# Patient Record
Sex: Female | Born: 1960 | Race: Black or African American | Hispanic: No | Marital: Single | Smoking: Never smoker
Health system: Southern US, Community
[De-identification: ages and names within clinical notes are randomized; demographics above are authoritative.]

## PROBLEM LIST (undated history)

## (undated) DIAGNOSIS — Z9221 Personal history of antineoplastic chemotherapy: Secondary | ICD-10-CM

## (undated) DIAGNOSIS — C50919 Malignant neoplasm of unspecified site of unspecified female breast: Secondary | ICD-10-CM

## (undated) DIAGNOSIS — M1990 Unspecified osteoarthritis, unspecified site: Secondary | ICD-10-CM

## (undated) DIAGNOSIS — C801 Malignant (primary) neoplasm, unspecified: Secondary | ICD-10-CM

## (undated) DIAGNOSIS — Z808 Family history of malignant neoplasm of other organs or systems: Secondary | ICD-10-CM

## (undated) DIAGNOSIS — Z923 Personal history of irradiation: Secondary | ICD-10-CM

## (undated) DIAGNOSIS — R112 Nausea with vomiting, unspecified: Secondary | ICD-10-CM

## (undated) DIAGNOSIS — Z803 Family history of malignant neoplasm of breast: Secondary | ICD-10-CM

## (undated) DIAGNOSIS — G43909 Migraine, unspecified, not intractable, without status migrainosus: Secondary | ICD-10-CM

## (undated) DIAGNOSIS — Z8669 Personal history of other diseases of the nervous system and sense organs: Secondary | ICD-10-CM

## (undated) DIAGNOSIS — Z800 Family history of malignant neoplasm of digestive organs: Secondary | ICD-10-CM

## (undated) DIAGNOSIS — Z9889 Other specified postprocedural states: Secondary | ICD-10-CM

## (undated) DIAGNOSIS — Z853 Personal history of malignant neoplasm of breast: Secondary | ICD-10-CM

## (undated) DIAGNOSIS — F419 Anxiety disorder, unspecified: Secondary | ICD-10-CM

## (undated) DIAGNOSIS — Z972 Presence of dental prosthetic device (complete) (partial): Secondary | ICD-10-CM

## (undated) HISTORY — DX: Family history of malignant neoplasm of breast: Z80.3

## (undated) HISTORY — DX: Migraine, unspecified, not intractable, without status migrainosus: G43.909

## (undated) HISTORY — DX: Unspecified osteoarthritis, unspecified site: M19.90

## (undated) HISTORY — DX: Family history of malignant neoplasm of other organs or systems: Z80.8

## (undated) HISTORY — DX: Family history of malignant neoplasm of digestive organs: Z80.0

---

## 2003-04-26 ENCOUNTER — Emergency Department (HOSPITAL_COMMUNITY): Admission: EM | Admit: 2003-04-26 | Discharge: 2003-04-27 | Payer: Self-pay | Admitting: Emergency Medicine

## 2003-06-23 ENCOUNTER — Encounter: Admission: RE | Admit: 2003-06-23 | Discharge: 2003-09-21 | Payer: Self-pay | Admitting: Orthopedic Surgery

## 2003-12-05 ENCOUNTER — Emergency Department (HOSPITAL_COMMUNITY): Admission: AD | Admit: 2003-12-05 | Discharge: 2003-12-05 | Payer: Self-pay | Admitting: Family Medicine

## 2004-06-01 ENCOUNTER — Encounter: Admission: RE | Admit: 2004-06-01 | Discharge: 2004-06-01 | Payer: Self-pay | Admitting: Internal Medicine

## 2004-06-01 ENCOUNTER — Encounter (INDEPENDENT_AMBULATORY_CARE_PROVIDER_SITE_OTHER): Payer: Self-pay | Admitting: *Deleted

## 2005-02-02 ENCOUNTER — Encounter: Admission: RE | Admit: 2005-02-02 | Discharge: 2005-02-02 | Payer: Self-pay | Admitting: Orthopedic Surgery

## 2006-10-31 ENCOUNTER — Emergency Department (HOSPITAL_COMMUNITY): Admission: EM | Admit: 2006-10-31 | Discharge: 2006-10-31 | Payer: Self-pay | Admitting: Emergency Medicine

## 2007-02-11 ENCOUNTER — Encounter: Admission: RE | Admit: 2007-02-11 | Discharge: 2007-02-11 | Payer: Self-pay | Admitting: Internal Medicine

## 2007-04-10 ENCOUNTER — Ambulatory Visit (HOSPITAL_COMMUNITY): Admission: RE | Admit: 2007-04-10 | Discharge: 2007-04-10 | Payer: Self-pay | Admitting: Gastroenterology

## 2009-11-16 ENCOUNTER — Encounter: Admission: RE | Admit: 2009-11-16 | Discharge: 2009-11-16 | Payer: Self-pay | Admitting: Internal Medicine

## 2010-04-19 ENCOUNTER — Encounter: Admission: RE | Admit: 2010-04-19 | Discharge: 2010-04-19 | Payer: Self-pay | Admitting: Internal Medicine

## 2010-05-24 ENCOUNTER — Encounter: Admission: RE | Admit: 2010-05-24 | Discharge: 2010-05-24 | Payer: Self-pay | Admitting: Obstetrics and Gynecology

## 2010-06-28 ENCOUNTER — Inpatient Hospital Stay (HOSPITAL_COMMUNITY): Admission: RE | Admit: 2010-06-28 | Discharge: 2010-06-30 | Payer: Self-pay | Admitting: Obstetrics and Gynecology

## 2010-06-28 ENCOUNTER — Encounter (INDEPENDENT_AMBULATORY_CARE_PROVIDER_SITE_OTHER): Payer: Self-pay | Admitting: Obstetrics and Gynecology

## 2010-06-28 HISTORY — PX: TOTAL ABDOMINAL HYSTERECTOMY W/ BILATERAL SALPINGOOPHORECTOMY: SHX83

## 2010-06-28 HISTORY — PX: CYSTOSCOPY: SHX5120

## 2010-12-31 ENCOUNTER — Encounter: Payer: Self-pay | Admitting: Internal Medicine

## 2011-02-23 LAB — CBC
MCV: 80.2 fL (ref 78.0–100.0)
Platelets: 258 10*3/uL (ref 150–400)
RBC: 3.91 MIL/uL (ref 3.87–5.11)
WBC: 9.4 10*3/uL (ref 4.0–10.5)

## 2011-02-23 LAB — BASIC METABOLIC PANEL
Calcium: 8.5 mg/dL (ref 8.4–10.5)
Chloride: 103 mEq/L (ref 96–112)
Creatinine, Ser: 0.61 mg/dL (ref 0.4–1.2)
GFR calc Af Amer: >60 mL/min (ref >60)
GFR calc non Af Amer: >60 mL/min (ref >60)

## 2011-02-24 LAB — SURGICAL PCR SCREEN
MRSA, PCR: NEGATIVE
Staphylococcus aureus: NEGATIVE

## 2011-02-24 LAB — PREGNANCY, URINE: Preg Test, Ur: NEGATIVE

## 2011-02-24 LAB — CBC
Platelets: 332 10*3/uL (ref 150–400)
RBC: 4.51 MIL/uL (ref 3.87–5.11)
WBC: 4.4 10*3/uL (ref 4.0–10.5)

## 2011-02-24 LAB — BASIC METABOLIC PANEL
Calcium: 9.1 mg/dL (ref 8.4–10.5)
GFR calc Af Amer: >60 mL/min (ref >60)
GFR calc non Af Amer: >60 mL/min (ref >60)
Potassium: 3.5 mEq/L (ref 3.5–5.1)
Sodium: 135 mEq/L (ref 135–145)

## 2011-04-26 NOTE — Op Note (Signed)
NAME:  Vanessa [name], Vanessa [name]              ACCOUNT NO.:  [id number]   MEDICAL RECORD NO.:  [id number]          PATIENT TYPE:  AMB   LOCATION:  ENDO                         FACILITY:  [facility]   PHYSICIAN:  Anselmo Rod, M.D.  DATE OF BIRTH:  07-16-1961   DATE OF PROCEDURE:  04/10/2007  DATE OF DISCHARGE:                               OPERATIVE REPORT   PROCEDURE PERFORMED:  Screening colonoscopy.   ENDOSCOPIST:  Anselmo Rod, M.D.   INSTRUMENT USED:  Pentax video colonoscope.   INDICATIONS FOR PROCEDURE:  A 50 year old [place] American female with a  family history of colon cancer in a sister diagnosed at the age of 15  and her paternal uncle diagnosed in his 3s, undergoing screening  colonoscopy to rule out colonic polyps, masses, etc.   PREPROCEDURE PREPARATION:  Informed consent was procured from the  patient.  The patient fasted for 8 hours prior to the procedure and  prepped with a bottle of magnesium citrate and a gallon of NuLYTELY the  night prior to the procedure.  Risks and benefits of the procedure  including a 10% miss rate of cancer and polyp were discussed with the  patient as well.   PREPROCEDURE PHYSICAL:  VITAL SIGNS:  The patient had stable vital  signs.  NECK:  Supple.  CHEST:  Clear to auscultation.  HEART:  S1 and S2 regular.  ABDOMEN:  Soft with normal bowel sounds.   DESCRIPTION OF PROCEDURE:  The patient was placed in the left lateral  decubitus position and sedated with 100 mcg of Fentanyl and 10 mg of  Versed given intravenously in slow incremental doses.  Once the patient  was adequately sedated and maintained on low-flow oxygen and continuous  cardiac monitoring, the Pentax video colonoscope was advanced from the  rectum to the cecum.  The appendiceal orifice and ileocecal valve were  clearly visualized and photographed.  The terminal ileum was briefly  visualized and appeared healthy without lesions.  No masses, polyps,  erosions, ulcerations or  diverticula were seen.  Retroflexion in the  rectum revealed small internal hemorrhoids.  The patient tolerated the  procedure well without immediate complications.   IMPRESSION:  Normal colonoscopy to the terminal ileum except for small  nonbleeding internal hemorrhoids. No masses, polyps or diverticula seen.   RECOMMENDATIONS:  1. Continue a high fiber diet with liberal fluid intake.  2. Repeat colonoscopy in the next five years unless the patient has      any abnormal symptoms in interim, in which case she should contact      the office immediately for further recommendations.  3. Outpatient follow-up as need arises in the future.  The patient is      to continue high fiber diet for now.      Anselmo Rod, M.D.  Electronically Signed     JNM/MEDQ  D:  04/10/2007  T:  04/10/2007  Job:  [id number]   cc:   Merlene Laughter. Renae Gloss, M.D.

## 2011-05-10 ENCOUNTER — Other Ambulatory Visit: Payer: Self-pay | Admitting: Internal Medicine

## 2011-05-10 DIAGNOSIS — Z1231 Encounter for screening mammogram for malignant neoplasm of breast: Secondary | ICD-10-CM

## 2011-05-16 ENCOUNTER — Ambulatory Visit
Admission: RE | Admit: 2011-05-16 | Discharge: 2011-05-16 | Disposition: A | Discharge status: Home / Self Care | Payer: 59 | Source: Ambulatory Visit | Attending: Internal Medicine | Admitting: Internal Medicine

## 2011-05-16 DIAGNOSIS — Z1231 Encounter for screening mammogram for malignant neoplasm of breast: Secondary | ICD-10-CM

## 2011-07-05 ENCOUNTER — Other Ambulatory Visit: Payer: Self-pay | Admitting: Specialist

## 2011-07-05 ENCOUNTER — Encounter (HOSPITAL_COMMUNITY): Payer: Worker's Compensation

## 2011-07-05 ENCOUNTER — Ambulatory Visit (HOSPITAL_COMMUNITY)
Admission: RE | Admit: 2011-07-05 | Discharge: 2011-07-05 | Disposition: A | Discharge status: Home / Self Care | Payer: Worker's Compensation | Source: Ambulatory Visit | Attending: Specialist | Admitting: Specialist

## 2011-07-05 ENCOUNTER — Other Ambulatory Visit (HOSPITAL_COMMUNITY): Payer: Self-pay | Admitting: Specialist

## 2011-07-05 DIAGNOSIS — Z01818 Encounter for other preprocedural examination: Secondary | ICD-10-CM | POA: Insufficient documentation

## 2011-07-05 DIAGNOSIS — Z01810 Encounter for preprocedural cardiovascular examination: Secondary | ICD-10-CM | POA: Insufficient documentation

## 2011-07-05 DIAGNOSIS — Z01812 Encounter for preprocedural laboratory examination: Secondary | ICD-10-CM | POA: Insufficient documentation

## 2011-07-05 LAB — CBC
HCT: 38.9 % (ref 36.0–46.0)
Hemoglobin: 12.2 g/dL (ref 12.0–15.0)
WBC: 3.5 10*3/uL — ABNORMAL LOW (ref 4.0–10.5)

## 2011-07-05 LAB — BASIC METABOLIC PANEL
BUN: 12 mg/dL (ref 6–23)
Chloride: 106 mEq/L (ref 96–112)
GFR calc Af Amer: >60 mL/min (ref >60)
Glucose, Bld: 86 mg/dL (ref 70–99)
Potassium: 3.3 mEq/L — ABNORMAL LOW (ref 3.5–5.1)
Sodium: 140 mEq/L (ref 135–145)

## 2011-07-05 LAB — SURGICAL PCR SCREEN: Staphylococcus aureus: NEGATIVE

## 2011-07-11 ENCOUNTER — Ambulatory Visit (HOSPITAL_BASED_OUTPATIENT_CLINIC_OR_DEPARTMENT_OTHER): Admission: RE | Admit: 2011-07-11 | Payer: Worker's Compensation | Source: Ambulatory Visit | Admitting: Specialist

## 2011-07-11 ENCOUNTER — Ambulatory Visit (HOSPITAL_COMMUNITY)
Admission: RE | Admit: 2011-07-11 | Discharge: 2011-07-11 | Disposition: A | Discharge status: Home / Self Care | Payer: Worker's Compensation | Source: Ambulatory Visit | Attending: Specialist | Admitting: Specialist

## 2011-07-11 DIAGNOSIS — I10 Essential (primary) hypertension: Secondary | ICD-10-CM | POA: Insufficient documentation

## 2011-07-11 DIAGNOSIS — M23302 Other meniscus derangements, unspecified lateral meniscus, unspecified knee: Secondary | ICD-10-CM | POA: Insufficient documentation

## 2011-07-11 DIAGNOSIS — Z363 Encounter for antenatal screening for malformations: Secondary | ICD-10-CM | POA: Insufficient documentation

## 2011-07-11 DIAGNOSIS — M23329 Other meniscus derangements, posterior horn of medial meniscus, unspecified knee: Secondary | ICD-10-CM | POA: Insufficient documentation

## 2011-07-11 DIAGNOSIS — Z01810 Encounter for preprocedural cardiovascular examination: Secondary | ICD-10-CM | POA: Insufficient documentation

## 2011-07-11 DIAGNOSIS — Z01812 Encounter for preprocedural laboratory examination: Secondary | ICD-10-CM | POA: Insufficient documentation

## 2011-07-11 DIAGNOSIS — Z1389 Encounter for screening for other disorder: Secondary | ICD-10-CM | POA: Insufficient documentation

## 2011-07-11 HISTORY — PX: KNEE ARTHROSCOPY: SHX127

## 2011-07-18 NOTE — Op Note (Signed)
  NAME:  Vanessa [name], NYKIRA REDDIX NO.:  [id number]  MEDICAL RECORD NO.:  [id number]  LOCATION:  DAYL                         FACILITY:  [facility]  PHYSICIAN:  Jene Every, M.D.    DATE OF BIRTH:  Apr 09, 1961  DATE OF PROCEDURE: DATE OF DISCHARGE:  07/11/2011                              OPERATIVE REPORT   PREOPERATIVE DIAGNOSES:  Medial meniscus tear of the right knee, osteoarthritis of patellofemoral joint, morbid obesity.  POSTOPERATIVE DIAGNOSES:  Medial meniscus tear of the right knee, osteoarthritis of patellofemoral joint.  PROCEDURES PERFORMED: 1. Right knee arthroscopy. 2. Partial medial meniscectomy. 3. Debridement of patellofemoral joint with chondroplasty patella and     the sulcus. 4. Debridement of lateral meniscus. 5. Degree of difficulty increased due to the patient's morbid obesity.  HISTORY:  A 50 year old with knee pain, fell at work, persistent knee pain, locking, popping, giving away.  MRI indicating moderate osteoarthritic changes of patellofemoral joint, chondromalacia, general changes of tibial plateau, discoid lateral meniscus, moderate effusion. Treated with refractory conservative treatments, indicated for arthroscopic evaluation and partial meniscectomy.  Risks and benefits discussed including infection, damage to vascular structures, no change in symptoms, worsening symptoms, need for repeat debridement, DVT, PE, anesthetic complications, etc.  TECHNIQUE:  With the patient in supine position after induction of adequate general anesthesia at 400 IV Cipro, the left lower extremity was prepped and draped in usual sterile fashion.  A lateral parapatellar portal was fashioned with a #11 blade.  Ingress cannula atraumatically placed.  Irrigant was utilized to insufflate the joint.  Under direct visualization, a medial parapatellar portal was fashioned with a #11 blade after localization with an 18 gauge needle, sparing the medial meniscus.  The  patient's degree of difficulty was increased due to her morbid obesity.  There was extensive grade 3 changes of the medial compartment.  Light chondroplasty performed on femoral condyle and tibial plateau with a 3.5 shaver.  Degenerative tearing of the posterior third of medial meniscus and a straight basket rongeur was introduced and utilized for partial medial meniscectomy to a stable base further contoured with a 3.5 shaver.  No grade 4 changes were seen.  ACL was unremarkable.  Lateral compartment revealed degenerative tearing of the lateral meniscus.  There was no discoid meniscus noted.  I debrided and shaved this to a stable base.  Light chondroplasty of femoral condyle was performed.  Suprapatellar pouch revealed extensive grade 3 changes of patellofemoral joint.  She actually had reasonable patellofemoral tracking.  No need for release.  Light chondroplasty performed of patella and sulcus. Gutters were unremarkable.  Copiously lavaged and I reexamined all compartments.  No further pathology amenable to arthroscopic intervention, therefore removed all instrumentation.  Portals were closed with 4-0 nylon simple sutures. Marcaine 0.25% with epinephrine was infiltrated in the joint.  Wound was dressed sterilely.  Awoken without difficulty.  Transported to recovery room in satisfactory condition.  The patient tolerated the procedure well.  There were no complications.     Jene Every, M.D.     Cordelia Pen  D:  07/11/2011  T:  07/12/2011  Job:  [id number]  Electronically Signed by Jene Every M.D. on 07/18/2011 07:03:21 AM

## 2011-11-25 ENCOUNTER — Ambulatory Visit (INDEPENDENT_AMBULATORY_CARE_PROVIDER_SITE_OTHER): Payer: 59

## 2011-11-25 DIAGNOSIS — K121 Other forms of stomatitis: Secondary | ICD-10-CM

## 2011-11-25 DIAGNOSIS — K0510 Chronic gingivitis, plaque induced: Secondary | ICD-10-CM

## 2011-11-25 DIAGNOSIS — E782 Mixed hyperlipidemia: Secondary | ICD-10-CM

## 2012-02-16 ENCOUNTER — Ambulatory Visit (INDEPENDENT_AMBULATORY_CARE_PROVIDER_SITE_OTHER): Payer: 59 | Admitting: Family Medicine

## 2012-02-16 VITALS — BP 123/87 | HR 87 | Temp 98.4°F | Resp 16 | Ht 64.0 in | Wt 270.0 lb

## 2012-02-16 DIAGNOSIS — J069 Acute upper respiratory infection, unspecified: Secondary | ICD-10-CM

## 2012-02-16 DIAGNOSIS — J40 Bronchitis, not specified as acute or chronic: Secondary | ICD-10-CM

## 2012-02-16 NOTE — Progress Notes (Signed)
51 yo woman at [employer] with 3-4 days of head congestion and cough, worsening.  Now wheezing.  No h/o asthma (children have asthma).   O:  HEENT: copious m/p discharge Chest:  Wheezes  A:  Bronchitis  P:  Z-pak, prednisone, tessalon

## 2012-04-16 ENCOUNTER — Other Ambulatory Visit: Payer: Self-pay | Admitting: Internal Medicine

## 2012-04-17 ENCOUNTER — Other Ambulatory Visit: Payer: Self-pay | Admitting: Obstetrics and Gynecology

## 2012-04-17 DIAGNOSIS — N63 Unspecified lump in unspecified breast: Secondary | ICD-10-CM

## 2012-04-17 DIAGNOSIS — N644 Mastodynia: Secondary | ICD-10-CM

## 2012-04-21 ENCOUNTER — Ambulatory Visit
Admission: RE | Admit: 2012-04-21 | Discharge: 2012-04-21 | Disposition: A | Discharge status: Home / Self Care | Payer: 59 | Source: Ambulatory Visit | Attending: Obstetrics and Gynecology | Admitting: Obstetrics and Gynecology

## 2012-04-21 DIAGNOSIS — N644 Mastodynia: Secondary | ICD-10-CM

## 2012-04-21 DIAGNOSIS — N63 Unspecified lump in unspecified breast: Secondary | ICD-10-CM

## 2012-07-10 ENCOUNTER — Other Ambulatory Visit: Payer: Self-pay | Admitting: Internal Medicine

## 2012-07-10 DIAGNOSIS — Z1231 Encounter for screening mammogram for malignant neoplasm of breast: Secondary | ICD-10-CM

## 2012-07-13 ENCOUNTER — Ambulatory Visit
Admission: RE | Admit: 2012-07-13 | Discharge: 2012-07-13 | Disposition: A | Discharge status: Home / Self Care | Payer: 59 | Source: Ambulatory Visit | Attending: Internal Medicine | Admitting: Internal Medicine

## 2012-07-13 DIAGNOSIS — Z1231 Encounter for screening mammogram for malignant neoplasm of breast: Secondary | ICD-10-CM

## 2012-07-15 ENCOUNTER — Other Ambulatory Visit: Payer: Self-pay | Admitting: Internal Medicine

## 2012-07-15 DIAGNOSIS — R928 Other abnormal and inconclusive findings on diagnostic imaging of breast: Secondary | ICD-10-CM

## 2012-07-22 ENCOUNTER — Ambulatory Visit
Admission: RE | Admit: 2012-07-22 | Discharge: 2012-07-22 | Disposition: A | Discharge status: Home / Self Care | Payer: 59 | Source: Ambulatory Visit | Attending: Internal Medicine | Admitting: Internal Medicine

## 2012-07-22 DIAGNOSIS — R928 Other abnormal and inconclusive findings on diagnostic imaging of breast: Secondary | ICD-10-CM

## 2013-03-22 ENCOUNTER — Ambulatory Visit (INDEPENDENT_AMBULATORY_CARE_PROVIDER_SITE_OTHER): Payer: 59 | Admitting: Emergency Medicine

## 2013-03-22 VITALS — BP 146/94 | HR 98 | Temp 98.4°F | Resp 18 | Ht 64.0 in | Wt 260.2 lb

## 2013-03-22 DIAGNOSIS — J309 Allergic rhinitis, unspecified: Secondary | ICD-10-CM

## 2013-03-22 DIAGNOSIS — E43 Unspecified severe protein-calorie malnutrition: Secondary | ICD-10-CM

## 2013-03-22 MED ORDER — DESLORATADINE 5 MG PO TABS: 5.0000 mg | ORAL_TABLET | Freq: Every day | ORAL | Status: DC

## 2013-03-22 MED ORDER — MOMETASONE FUROATE 50 MCG/ACT NA SUSP: 4.0000 | Freq: Every day | NASAL | Status: DC

## 2013-03-22 NOTE — Patient Instructions (Addendum)
Allergic Rhinitis  Allergic rhinitis is when the mucous membranes in the nose respond to allergens. Allergens are particles in the air that cause your body to have an allergic reaction. This causes you to release allergic antibodies. Through a chain of events, these eventually cause you to release histamine into the blood stream (hence the use of antihistamines). Although meant to be protective to the body, it is this release that causes your discomfort, such as frequent sneezing, congestion and an itchy runny nose.    CAUSES    The pollen allergens may come from grasses, trees, and weeds. This is seasonal allergic rhinitis, or "hay fever." Other allergens cause year-round allergic rhinitis (perennial allergic rhinitis) such as house dust mite allergen, pet dander and mold spores.    SYMPTOMS     Nasal stuffiness (congestion).   Runny, itchy nose with sneezing and tearing of the eyes.   There is often an itching of the mouth, eyes and ears.  It cannot be cured, but it can be controlled with medications.  DIAGNOSIS    If you are unable to determine the offending allergen, skin or blood testing may find it.  TREATMENT     Avoid the allergen.   Medications and allergy shots (immunotherapy) can help.   Hay fever may often be treated with antihistamines in pill or nasal spray forms. Antihistamines block the effects of histamine. There are over-the-counter medicines that may help with nasal congestion and swelling around the eyes. Check with your caregiver before taking or giving this medicine.  If the treatment above does not work, there are many new medications your caregiver can prescribe. Stronger medications may be used if initial measures are ineffective. Desensitizing injections can be used if medications and avoidance fails. Desensitization is when a patient is given ongoing shots until the body becomes less sensitive to the allergen. Make sure you follow up with your caregiver if problems continue.   SEEK MEDICAL CARE IF:     You develop fever (more than 100.5 F (38.1 C).   You develop a cough that does not stop easily (persistent).   You have shortness of breath.   You start wheezing.   Symptoms interfere with normal daily activities.  Document Released: 08/20/2001 Document Revised: 02/17/2012 Document Reviewed: 03/01/2009  [employer] Patient Information 2013 [employer].

## 2013-03-22 NOTE — Progress Notes (Signed)
[facility] [address] [place] [address] [phone]  Date:  03/22/2013   Name:  Vanessa [name]   DOB:  [birth year]   MRN:  [id number]  PCP:  No primary provider on file.    Chief Complaint: Sore Throat and Nasal Congestion   History of Present Illness:  Vanessa [name] is a 52 y.o. very pleasant female patient who presents with the following:  Ill since Thursday with nasal congestion, drainage that is mucoid and watery, sore throat. Itchy watery eyes.  Non productive cough.  No wheezing or shortness of breath.  No improvement with over the counter medications or other home remedies. Denies other complaint or health concern today.   There is no problem list on file for this patient.   Past Medical History  Diagnosis Date  . Allergy   . Arthritis   . Anemia   . Hypertension     Past Surgical History  Procedure Laterality Date  . Breast surgery      biopsy  . Abdominal hysterectomy    . Meniscus repair      L knee    History  Substance Use Topics  . Smoking status: Never Smoker   . Smokeless tobacco: Not on file  . Alcohol Use: No    Family History  Problem Relation Age of Onset  . Diabetes Mother   . Hypertension Mother   . Asthma Daughter   . Diabetes Sister   . Hypertension Sister     Allergies  Allergen Reactions  . Penicillins Anaphylaxis  . Codeine Rash  . Darvocet (Propoxyphene-Acetaminophen) Rash  . Demerol Rash  . Erythromycin Rash  . Flexeril (Cyclobenzaprine Hcl) Rash  . Sulfa Antibiotics Rash    Medication list has been reviewed and updated.  Current Outpatient Prescriptions on File Prior to Visit  Medication Sig Dispense Refill  . diclofenac (VOLTAREN) 75 MG EC tablet Take 75 mg by mouth 2 (two) times daily.      Marland Kitchen dextromethorphan-guaiFENesin (MUCINEX DM) 30-600 MG per 12 hr tablet Take 1 tablet by mouth every 12 (twelve) hours.      . triamterene-hydrochlorothiazide (MAXZIDE-25) 37.5-25 MG per tablet  Take 1 tablet by mouth daily.       No current facility-administered medications on file prior to visit.    Review of Systems:  As per HPI, otherwise negative.    Physical Examination: Filed Vitals:   03/22/13 1801  BP: 146/94  Pulse: 98  Temp: 98.4 F (36.9 C)  Resp: 18   Filed Vitals:   03/22/13 1801  Height: 5\' 4"  (1.626 m)  Weight: 260 lb 3.2 oz (118.026 kg)   Body mass index is 44.64 kg/(m^2). Ideal Body Weight: Weight in (lb) to have BMI = 25: 145.3  GEN: WDWN, NAD, Non-toxic, A & O x 3 HEENT: Atraumatic, Normocephalic. Neck supple. No masses, No LAD.  Conjunctival injection Ears and Nose: No external deformity.  TM negative CV: RRR, No M/G/R. No JVD. No thrill. No extra heart sounds. PULM: CTA B, no wheezes, crackles, rhonchi. No retractions. No resp. distress. No accessory muscle use. ABD: S, NT, ND, +BS. No rebound. No HSM. EXTR: No c/c/e NEURO Normal gait.  PSYCH: Normally interactive. Conversant. Not depressed or anxious appearing.  Calm demeanor.    Assessment and Plan: Seasonal allergic rhinitis clarinex nasonex   Signed,  Phillips Odor, MD   Results for orders placed in visit on 03/22/13  POCT RAPID STREP A (OFFICE)  Result Value Range   Rapid Strep A Screen Negative  Negative

## 2013-03-24 NOTE — Progress Notes (Signed)
Reviewed and agree.

## 2013-03-25 NOTE — Progress Notes (Signed)
Tried to get PA on clarinex and it was denied d/t pt never having tried Zyzol. Called pt and she stated that she has already bought some Zyrtec which she reported works well for her w/out SEs that some meds have caused. She does not need another Rx at this time.

## 2013-03-26 ENCOUNTER — Other Ambulatory Visit: Payer: Self-pay

## 2013-03-26 MED ORDER — BENZONATATE 100 MG PO CAPS
100.0000 mg | ORAL_CAPSULE | Freq: Three times a day (TID) | ORAL | Status: DC | PRN
Start: 2013-03-26 — End: 2014-05-25

## 2013-03-26 NOTE — Telephone Encounter (Signed)
Is she taking Zyrtec? Mucinex, she is not using Mucinex, will start this, she is taking zyrtec, she would like tessalon perles to pharmacy, pended please advise if okay

## 2013-03-26 NOTE — Telephone Encounter (Signed)
Pt calling in regards to not feeling any better, pt still c/o coughing and sore throat and phlegm is dark yellowish green in color.  Pharmacy: [facility] [phone]

## 2013-03-26 NOTE — Telephone Encounter (Signed)
I sent to pharmacy.

## 2013-08-02 ENCOUNTER — Other Ambulatory Visit: Payer: Self-pay

## 2013-08-02 DIAGNOSIS — Z1231 Encounter for screening mammogram for malignant neoplasm of breast: Secondary | ICD-10-CM

## 2013-08-17 ENCOUNTER — Ambulatory Visit
Admission: RE | Admit: 2013-08-17 | Discharge: 2013-08-17 | Disposition: A | Discharge status: Home / Self Care | Payer: 59 | Source: Ambulatory Visit

## 2013-08-17 DIAGNOSIS — Z1231 Encounter for screening mammogram for malignant neoplasm of breast: Secondary | ICD-10-CM

## 2014-01-20 HISTORY — PX: LESION EXCISION: SHX5167

## 2014-04-23 ENCOUNTER — Ambulatory Visit (INDEPENDENT_AMBULATORY_CARE_PROVIDER_SITE_OTHER): Payer: 59 | Admitting: Internal Medicine

## 2014-04-23 VITALS — BP 136/90 | HR 111 | Temp 98.4°F | Resp 18 | Ht 63.5 in | Wt 271.0 lb

## 2014-04-23 DIAGNOSIS — R51 Headache: Secondary | ICD-10-CM

## 2014-04-23 DIAGNOSIS — J019 Acute sinusitis, unspecified: Secondary | ICD-10-CM

## 2014-04-23 DIAGNOSIS — R519 Headache, unspecified: Secondary | ICD-10-CM

## 2014-04-23 DIAGNOSIS — I10 Essential (primary) hypertension: Secondary | ICD-10-CM

## 2014-04-23 DIAGNOSIS — J309 Allergic rhinitis, unspecified: Secondary | ICD-10-CM

## 2014-04-23 MED ORDER — LEVOFLOXACIN 500 MG PO TABS: 500.0000 mg | ORAL_TABLET | Freq: Every day | ORAL | Status: DC

## 2014-04-23 NOTE — Progress Notes (Signed)
° °  Subjective:    Patient ID: Vanessa [name], female    DOB: [birth year], 53 y.o.   MRN: [id number]    HPI   HPI Comments: Vanessa [name] is a 53 y.o. female who presents to the Emergency Department complaining of gradually worsening, constant HA that began 4 days ago. Patient describes the pain as throbbing/pounding, and states it radiates front the back of her head to the front of her face. She reports the pain is worse around the eyes. She states she can "feel the pounding when she walks". She denies dizziness, fever, night sweats, blurred vision, nasal congestion, pain with swallowing, or trouble swallowing. Patient reports this is a bad allergy season for her. Patient has used Zyrtec D, Nasonex, and Tylenol Arthritis with some relief. Patient has history of migraines, but states she has not experienced one recently.      Review of Systems  Constitutional: Negative for fever and diaphoresis.  HENT: Negative for congestion, rhinorrhea and trouble swallowing.   Eyes: Negative for visual disturbance.  Neurological: Positive for headaches. Negative for dizziness.       Objective:   Physical Exam  Nursing note and vitals reviewed. Constitutional: She is oriented to person, place, and time. She appears well-developed and well-nourished. No distress.  HENT:  Head: Normocephalic and atraumatic.  Right Ear: External ear normal.  Left Ear: External ear normal.  Mouth/Throat: Oropharynx is clear and moist.  Swollen turbinates. Very tender to percussion left maxillary.  Eyes: Conjunctivae and EOM are normal. Pupils are equal, round, and reactive to light. Left eye exhibits no discharge.  Neck: Neck supple.  Cardiovascular: Normal rate.   Pulmonary/Chest: Effort normal. No respiratory distress.  Musculoskeletal: Normal range of motion.  Lymphadenopathy:    She has no cervical adenopathy.  Neurological: She is alert and oriented to person, place, and time. No cranial nerve deficit.    Skin: Skin is warm and dry.  Psychiatric: She has a normal mood and affect. Her behavior is normal.          Assessment & Plan:    I have completed the patient encounter in its entirety as documented by the scribe, with editing by me where necessary. Robert P. Laney Pastor, M.D.  HA (headache)-secondary  Allergic rhinitis  Sinusitis, acute  Meds ordered this encounter  Medications   acetaminophen (TYLENOL) 325 MG tablet    Sig: Take 325 mg by mouth every 6 (six) hours as needed.   levofloxacin (LEVAQUIN) 500 MG tablet---chosen due to extens allergies    Sig: Take 1 tablet (500 mg total) by mouth daily.    Dispense:  10 tablet    Refill:  0  Sudafed OTC Antihistamines Continue Nasonex

## 2014-04-24 DIAGNOSIS — I10 Essential (primary) hypertension: Secondary | ICD-10-CM | POA: Insufficient documentation

## 2014-05-11 ENCOUNTER — Telehealth: Payer: Self-pay

## 2014-05-11 NOTE — Telephone Encounter (Signed)
Pt saw Dr.Doolittle for a sinus infection, she took the medications prescribed, but it wanting to know if she can be prescribed a stronger decongestant ; she is still experiencing sinus pressure pain.

## 2014-05-12 MED ORDER — IPRATROPIUM BROMIDE 0.03 % NA SOLN: 2.0000 | Freq: Two times a day (BID) | NASAL | Status: DC

## 2014-05-12 NOTE — Telephone Encounter (Signed)
Add Atrovent NS. If symptoms persist, RTC for re-evaluation.  May need steroid course.  Meds ordered this encounter  Medications  . ipratropium (ATROVENT) 0.03 % nasal spray    Sig: Place 2 sprays into both nostrils 2 (two) times daily.    Dispense:  30 mL    Refill:  0    Order Specific Question:  Supervising Provider    Answer:  DOOLITTLE, ROBERT P [[id number]]

## 2014-05-13 NOTE — Telephone Encounter (Signed)
Lm advising rx sent to the pharmacy.

## 2014-05-25 ENCOUNTER — Ambulatory Visit (INDEPENDENT_AMBULATORY_CARE_PROVIDER_SITE_OTHER): Payer: 59 | Admitting: Family Medicine

## 2014-05-25 ENCOUNTER — Encounter: Payer: Self-pay | Admitting: Family Medicine

## 2014-05-25 VITALS — BP 134/92 | HR 87 | Temp 98.1°F | Resp 16 | Ht 64.0 in | Wt 272.0 lb

## 2014-05-25 DIAGNOSIS — D72819 Decreased white blood cell count, unspecified: Secondary | ICD-10-CM

## 2014-05-25 DIAGNOSIS — R519 Headache, unspecified: Secondary | ICD-10-CM

## 2014-05-25 DIAGNOSIS — J019 Acute sinusitis, unspecified: Secondary | ICD-10-CM

## 2014-05-25 DIAGNOSIS — I10 Essential (primary) hypertension: Secondary | ICD-10-CM

## 2014-05-25 DIAGNOSIS — R51 Headache: Secondary | ICD-10-CM

## 2014-05-25 LAB — POCT CBC
GRANULOCYTE PERCENT: 43.8 % (ref 37–80)
HEMATOCRIT: 42.9 % (ref 37.7–47.9)
Hemoglobin: 13.3 g/dL (ref 12.2–16.2)
Lymph, poc: 1.8 (ref 0.6–3.4)
MCH, POC: 27 pg (ref 27–31.2)
MCHC: 31 g/dL — AB (ref 31.8–35.4)
MCV: 87.1 fL (ref 80–97)
MID (cbc): 0.3 (ref 0–0.9)
MPV: 8.6 fL (ref 0–99.8)
POC GRANULOCYTE: 1.6 — AB (ref 2–6.9)
POC LYMPH %: 48.6 % (ref 10–50)
POC MID %: 7.6 %M (ref 0–12)
Platelet Count, POC: 307 10*3/uL (ref 142–424)
RBC: 4.92 M/uL (ref 4.04–5.48)
RDW, POC: 14.3 %
WBC: 3.7 10*3/uL — AB (ref 4.6–10.2)

## 2014-05-25 LAB — BASIC METABOLIC PANEL
BUN: 14 mg/dL (ref 6–23)
CHLORIDE: 103 meq/L (ref 96–112)
CO2: 27 mEq/L (ref 19–32)
Calcium: 9.1 mg/dL (ref 8.4–10.5)
Creat: 0.72 mg/dL (ref 0.50–1.10)
GLUCOSE: 86 mg/dL (ref 70–99)
POTASSIUM: 3.7 meq/L (ref 3.5–5.3)
SODIUM: 139 meq/L (ref 135–145)

## 2014-05-25 MED ORDER — LEVOFLOXACIN 500 MG PO TABS: 500.0000 mg | ORAL_TABLET | Freq: Every day | ORAL | Status: DC

## 2014-05-25 MED ORDER — PREDNISONE 20 MG PO TABS: ORAL_TABLET | ORAL | Status: DC

## 2014-05-25 NOTE — Patient Instructions (Signed)
Use the levaquin antibiotic and the prednisone for 3 days.  Let me know if you do not feel better soon- if you continue to have symptoms we will refer you to have a CT scan.  Please let me know how you are doing!

## 2014-05-25 NOTE — Addendum Note (Signed)
Addended by: Lamar Blinks C on: 05/25/2014 05:19 PM   Modules accepted: Orders

## 2014-05-25 NOTE — Progress Notes (Signed)
[facility] [address] [place] [address] [phone]  Date:  05/25/2014   Name:  Vanessa [name]   DOB:  10-18-1961   MRN:  [id number]  PCP:  No PCP Per Patient    Chief Complaint: Headache and Facial Pain   History of Present Illness:  Vanessa [name] is a 53 y.o. very pleasant female patient who presents with the following:  She was here about a month ago and treated with LQ for acute sinusitis. She got better, but the left side of her sinuses seemed to remain slightly "clogged."  She traveled to [place] by plane- her sx got worse while she was there over the weekend.  She notes worsening pain on the left side of her face.  She was never quite back to normal after her illness last month.   This am she drained a little material down her throat.  No runny nose, no sneezing.   She had been using atrovent/ nasonex but stopped using these.   She has not noted a fever.  No cough.  The left ear is "pounding off and on."  History of HTN and AR, but is otherwise generally healthy She has not had chronic sinus issues- this is a fairly new issue for her.    Patient Active Problem List   Diagnosis Date Noted  . Unspecified essential hypertension 04/24/2014    Past Medical History  Diagnosis Date  . Allergy   . Arthritis   . Anemia   . Hypertension     Past Surgical History  Procedure Laterality Date  . Breast surgery      biopsy  . Abdominal hysterectomy    . Meniscus repair      L knee    History  Substance Use Topics  . Smoking status: Never Smoker   . Smokeless tobacco: Not on file  . Alcohol Use: No    Family History  Problem Relation Age of Onset  . Diabetes Mother   . Hypertension Mother   . Asthma Daughter   . Diabetes Sister   . Hypertension Sister     Allergies  Allergen Reactions  . Penicillins Anaphylaxis  . Percocet [Oxycodone-Acetaminophen]   . Codeine Rash  . Darvocet [Propoxyphene N-Acetaminophen] Rash  .  Demerol Rash  . Erythromycin Rash  . Flexeril [Cyclobenzaprine Hcl] Rash  . Sulfa Antibiotics Rash    Medication list has been reviewed and updated.  Current Outpatient Prescriptions on File Prior to Visit  Medication Sig Dispense Refill  . cetirizine-pseudoephedrine (ZYRTEC-D) 5-120 MG per tablet Take 1 tablet by mouth 2 (two) times daily.      Marland Kitchen ibuprofen (ADVIL,MOTRIN) 200 MG tablet Take 600 mg by mouth every 6 (six) hours as needed for pain.      Marland Kitchen diclofenac (VOLTAREN) 75 MG EC tablet Take 75 mg by mouth 2 (two) times daily.      Marland Kitchen ipratropium (ATROVENT) 0.03 % nasal spray Place 2 sprays into both nostrils 2 (two) times daily.  30 mL  0  . mometasone (NASONEX) 50 MCG/ACT nasal spray Place 4 sprays into the nose daily.  17 g  12  . triamterene-hydrochlorothiazide (MAXZIDE-25) 37.5-25 MG per tablet Take 1 tablet by mouth daily.       No current facility-administered medications on file prior to visit.    Review of Systems:  As per HPI- otherwise negative.   Physical Examination: Filed Vitals:   05/25/14 0952  BP: 134/92  Pulse: 87  Temp: 98.1 F (36.7 C)  Resp: 16   Filed Vitals:   05/25/14 0952  Height: 5\' 4"  (1.626 m)  Weight: 272 lb (123.378 kg)   Body mass index is 46.67 kg/(m^2). Ideal Body Weight: Weight in (lb) to have BMI = 25: 145.3  GEN: WDWN, NAD, Non-toxic, A & O x 3, obese, looks well HEENT: Atraumatic, Normocephalic. Neck supple. No masses, No LAD.  Bilateral TM wnl, oropharynx normal.  PEERL,EOMI.   There is tenderness in the left frontal sinuses. No particular pain with movement of eyes. Nasal cavity congested Ears and Nose: No external deformity. CV: RRR, No M/G/R. No JVD. No thrill. No extra heart sounds. PULM: CTA B, no wheezes, crackles, rhonchi. No retractions. No resp. distress. No accessory muscle use. EXTR: No c/c/e NEURO Normal gait.  PSYCH: Normally interactive. Conversant. Not depressed or anxious appearing.  Calm demeanor.   Results  for orders placed in visit on 05/25/14  POCT CBC      Result Value Ref Range   WBC 3.7 (*) 4.6 - 10.2 K/uL   Lymph, poc 1.8  0.6 - 3.4   POC LYMPH PERCENT 48.6  10 - 50 %L   MID (cbc) 0.3  0 - 0.9   POC MID % 7.6  0 - 12 %M   POC Granulocyte 1.6 (*) 2 - 6.9   Granulocyte percent 43.8  37 - 80 %G   RBC 4.92  4.04 - 5.48 M/uL   Hemoglobin 13.3  12.2 - 16.2 g/dL   HCT, POC 42.9  37.7 - 47.9 %   MCV 87.1  80 - 97 fL   MCH, POC 27.0  27 - 31.2 pg   MCHC 31.0 (*) 31.8 - 35.4 g/dL   RDW, POC 14.3     Platelet Count, POC 307  142 - 424 K/uL   MPV 8.6  0 - 99.8 fL    Assessment and Plan: Acute sinusitis - Plan: levofloxacin (LEVAQUIN) 500 MG tablet, predniSONE (DELTASONE) 20 MG tablet  HTN (hypertension) - Plan: POCT CBC, Basic metabolic panel  Facial pain  Offered a CT scan today for apparent persistent sinusitis. She declines this today but will be willing to undergo CT if her sx persist. Treat with prednisone just for 3 days and levaquin, labs pending   Signed Lamar Blinks, MD

## 2014-05-27 ENCOUNTER — Encounter (HOSPITAL_COMMUNITY): Payer: Self-pay | Admitting: Emergency Medicine

## 2014-05-27 ENCOUNTER — Telehealth: Payer: Self-pay

## 2014-05-27 ENCOUNTER — Emergency Department (HOSPITAL_COMMUNITY): Payer: 59

## 2014-05-27 ENCOUNTER — Emergency Department (HOSPITAL_COMMUNITY)
Admission: EM | Admit: 2014-05-27 | Discharge: 2014-05-27 | Disposition: A | Discharge status: Home / Self Care | Payer: 59 | Attending: Emergency Medicine | Admitting: Emergency Medicine

## 2014-05-27 DIAGNOSIS — Z79899 Other long term (current) drug therapy: Secondary | ICD-10-CM | POA: Insufficient documentation

## 2014-05-27 DIAGNOSIS — Z862 Personal history of diseases of the blood and blood-forming organs and certain disorders involving the immune mechanism: Secondary | ICD-10-CM | POA: Insufficient documentation

## 2014-05-27 DIAGNOSIS — Z792 Long term (current) use of antibiotics: Secondary | ICD-10-CM | POA: Insufficient documentation

## 2014-05-27 DIAGNOSIS — G500 Trigeminal neuralgia: Secondary | ICD-10-CM | POA: Insufficient documentation

## 2014-05-27 DIAGNOSIS — R519 Headache, unspecified: Secondary | ICD-10-CM

## 2014-05-27 DIAGNOSIS — Z791 Long term (current) use of non-steroidal anti-inflammatories (NSAID): Secondary | ICD-10-CM | POA: Insufficient documentation

## 2014-05-27 DIAGNOSIS — M129 Arthropathy, unspecified: Secondary | ICD-10-CM | POA: Insufficient documentation

## 2014-05-27 DIAGNOSIS — Z880 Allergy status to penicillin: Secondary | ICD-10-CM | POA: Insufficient documentation

## 2014-05-27 DIAGNOSIS — Z8739 Personal history of other diseases of the musculoskeletal system and connective tissue: Secondary | ICD-10-CM | POA: Insufficient documentation

## 2014-05-27 DIAGNOSIS — I10 Essential (primary) hypertension: Secondary | ICD-10-CM | POA: Insufficient documentation

## 2014-05-27 DIAGNOSIS — R51 Headache: Secondary | ICD-10-CM

## 2014-05-27 MED ORDER — HYDROCODONE-ACETAMINOPHEN 5-325 MG PO TABS: 1.0000 | ORAL_TABLET | ORAL | Status: DC | PRN

## 2014-05-27 MED ORDER — GABAPENTIN 300 MG PO CAPS: 300.0000 mg | ORAL_CAPSULE | Freq: Three times a day (TID) | ORAL | Status: DC

## 2014-05-27 NOTE — Discharge Instructions (Signed)
Your Ct scans show no abnormalities. You pain could be from a syndrome called trigeminal neuralgia. (See info below). The Neurontin prescription below is a medicine to treat trigeminal Neuralgia.  Trigeminal Neuralgia Trigeminal neuralgia is a nerve disorder that causes sudden attacks of severe facial pain. It is caused by damage to the trigeminal nerve, a major nerve in the face. It is more common in women and in the elderly, although it can also happen in younger patients. Attacks last from a few seconds to several minutes and can occur from a couple of times per year to several times per day. Trigeminal neuralgia can be a very distressing and disabling condition. Surgery may be needed in very severe cases if medical treatment does not give relief. HOME CARE INSTRUCTIONS   If your caregiver prescribed medication to help prevent attacks, take as directed.  To help prevent attacks:  Chew on the unaffected side of the mouth.  Avoid touching your face.  Avoid blasts of hot or cold air.  Men may wish to grow a beard to avoid having to shave. SEEK IMMEDIATE MEDICAL CARE IF:  Pain is unbearable and your medicine does not help.  You develop new, unexplained symptoms (problems).  You have problems that may be related to a medication you are taking. Document Released: 11/22/2000 Document Revised: 02/17/2012 Document Reviewed: 09/22/2009 [facility] Patient Information 2015 [place], [place]. This information is not intended to replace advice given to you by your health care provider. Make sure you discuss any questions you have with your health care provider.

## 2014-05-27 NOTE — ED Notes (Signed)
Pt. Stated, I've had sinus problem May 16th and given antibiotic and pain medicine and its still there.  My pain is right behind my left eye a pain like I've never had.  I can't get into see my Dr.

## 2014-05-27 NOTE — ED Notes (Signed)
The pain that Im experiencing now started on Sunday.

## 2014-05-27 NOTE — ED Provider Notes (Signed)
CSN: [id number]     Arrival date & time 05/27/14  1308 History   First MD Initiated Contact with Patient 05/27/14 1339     Chief Complaint  Patient presents with  . Headache      HPI  History presents with left facial pain. Having symptoms for about 6 weeks. Seen in urgent care placed on 7-10 days of Levaquin. Also given Flonase inhaler. He been taking Zyrtec for several days. Patient did not improve. Continue medications. Is followed up with her physician in urgent care again is placed on a dose of Levaquin 5 days ago. She describes left facial pain. From there she can upward. Does not cross the midline to the right. According to her scalp. Is occasionally throbbing and sharp. The states almost always present. Really no sinus symptoms since starting on Zyrtec over a month ago. No sinus congestion. No nasal discharge. No nosebleed or bloody nasal discharge. No tooth pain. No ear problems.  Past Medical History  Diagnosis Date  . Allergy   . Arthritis   . Anemia   . Hypertension    Past Surgical History  Procedure Laterality Date  . Breast surgery      biopsy  . Abdominal hysterectomy    . Meniscus repair      L knee   Family History  Problem Relation Age of Onset  . Diabetes Mother   . Hypertension Mother   . Asthma Daughter   . Diabetes Sister   . Hypertension Sister    History  Substance Use Topics  . Smoking status: Never Smoker   . Smokeless tobacco: Not on file  . Alcohol Use: No   OB History   Grav Para Term Preterm Abortions TAB SAB Ect Mult Living                 Review of Systems  Constitutional: Negative for fever, chills, diaphoresis, appetite change and fatigue.  HENT: Negative for mouth sores, sore throat and trouble swallowing.        Left facial pain  Eyes: Negative for visual disturbance.  Respiratory: Negative for cough, chest tightness, shortness of breath and wheezing.   Cardiovascular: Negative for chest pain.  Gastrointestinal: Negative for  nausea, vomiting, abdominal pain, diarrhea and abdominal distention.  Endocrine: Negative for polydipsia, polyphagia and polyuria.  Genitourinary: Negative for dysuria, frequency and hematuria.  Musculoskeletal: Negative for gait problem.  Skin: Negative for color change, pallor and rash.  Neurological: Negative for dizziness, syncope, light-headedness and headaches.  Hematological: Does not bruise/bleed easily.  Psychiatric/Behavioral: Negative for behavioral problems and confusion.      Allergies  Penicillins; Codeine; Darvocet; Demerol; Erythromycin; Flexeril; Percocet; and Sulfa antibiotics  Home Medications   Prior to Admission medications   Medication Sig Start Date End Date Taking? Authorizing Joden Bonsall  Aspirin-Acetaminophen-Caffeine (EXCEDRIN MIGRAINE PO) Take 2 tablets by mouth daily as needed.   Yes Historical Kyrese Gartman, MD  diclofenac (VOLTAREN) 75 MG EC tablet Take 75 mg by mouth 2 (two) times daily as needed.    Yes Historical Odean Fester, MD  ibuprofen (ADVIL,MOTRIN) 200 MG tablet Take 800 mg by mouth every 6 (six) hours as needed for pain.    Yes Historical Harolyn Cocker, MD  levofloxacin (LEVAQUIN) 500 MG tablet Take 1 tablet (500 mg total) by mouth daily. 05/25/14  Yes Gay Filler Copland, MD  predniSONE (DELTASONE) 20 MG tablet Take 2 pills a day for 3 days 05/25/14  Yes Gay Filler Copland, MD  pseudoephedrine-guaifenesin (MUCINEX D) 60-600 MG  per tablet Take 1 tablet by mouth every 12 (twelve) hours.   Yes Historical Sary Bogie, MD  gabapentin (NEURONTIN) 300 MG capsule Take 1 capsule (300 mg total) by mouth 3 (three) times daily. 05/27/14   Tanna Furry, MD  HYDROcodone-acetaminophen (NORCO/VICODIN) 5-325 MG per tablet Take 1 tablet by mouth every 4 (four) hours as needed. 05/27/14   Tanna Furry, MD   BP 151/92  Pulse 108  Temp(Src) 98.7 F (37.1 C) (Oral)  Resp 20  SpO2 96% Physical Exam  Constitutional: She is oriented to person, place, and time. She appears well-developed and  well-nourished. No distress.  HENT:  Head: Normocephalic.    Eyes: Conjunctivae are normal. Pupils are equal, round, and reactive to light. No scleral icterus.  Neck: Normal range of motion. Neck supple. No thyromegaly present.  Cardiovascular: Normal rate and regular rhythm.  Exam reveals no gallop and no friction rub.   No murmur heard. Pulmonary/Chest: Effort normal and breath sounds normal. No respiratory distress. She has no wheezes. She has no rales.  Abdominal: Soft. Bowel sounds are normal. She exhibits no distension. There is no tenderness. There is no rebound.  Musculoskeletal: Normal range of motion.  Neurological: She is alert and oriented to person, place, and time.  Skin: Skin is warm and dry. No rash noted.  Psychiatric: She has a normal mood and affect. Her behavior is normal.    ED Course  Procedures (including critical care time) Labs Review Labs Reviewed - No data to display  Imaging Review Ct Head Wo Contrast  05/27/2014   CLINICAL DATA:  53 year old female with worst headache of life and sinus pain. Nausea, dizziness and photophobia.  EXAM: CT HEAD WITHOUT CONTRAST  CT MAXILLOFACIAL WITHOUT CONTRAST  TECHNIQUE: Multidetector CT imaging of the head and maxillofacial structures were performed using the standard protocol without intravenous contrast. Multiplanar CT image reconstructions of the maxillofacial structures were also generated.  COMPARISON:  None.  FINDINGS: CT HEAD FINDINGS  No intracranial abnormalities are identified, including mass lesion or mass effect, hydrocephalus, extra-axial fluid collection, midline shift, hemorrhage, or acute infarction.  The visualized bony calvarium is unremarkable.  CT MAXILLOFACIAL FINDINGS  The paranasal sinuses, mastoid air cells and middle/ inner years are clear.  No bony abnormalities are identified.  The orbits and globes are unremarkable.  There is no evidence of fracture, subluxation or dislocation.  There may be a  periapical abscess of tooth 8.  No soft tissue abnormalities are noted.  IMPRESSION: Unremarkable noncontrast head CT.  Probable periapical abscess of tooth 8 without other maxillofacial abnormality.  No evidence of paranasal or mastoid sinus disease.   Electronically Signed   By: Hassan Rowan M.D.   On: 05/27/2014 15:23   Ct Maxillofacial Wo Cm  05/27/2014   CLINICAL DATA:  53 year old female with worst headache of life and sinus pain. Nausea, dizziness and photophobia.  EXAM: CT HEAD WITHOUT CONTRAST  CT MAXILLOFACIAL WITHOUT CONTRAST  TECHNIQUE: Multidetector CT imaging of the head and maxillofacial structures were performed using the standard protocol without intravenous contrast. Multiplanar CT image reconstructions of the maxillofacial structures were also generated.  COMPARISON:  None.  FINDINGS: CT HEAD FINDINGS  No intracranial abnormalities are identified, including mass lesion or mass effect, hydrocephalus, extra-axial fluid collection, midline shift, hemorrhage, or acute infarction.  The visualized bony calvarium is unremarkable.  CT MAXILLOFACIAL FINDINGS  The paranasal sinuses, mastoid air cells and middle/ inner years are clear.  No bony abnormalities are identified.  The orbits and  globes are unremarkable.  There is no evidence of fracture, subluxation or dislocation.  There may be a periapical abscess of tooth 8.  No soft tissue abnormalities are noted.  IMPRESSION: Unremarkable noncontrast head CT.  Probable periapical abscess of tooth 8 without other maxillofacial abnormality.  No evidence of paranasal or mastoid sinus disease.   Electronically Signed   By: Hassan Rowan M.D.   On: 05/27/2014 15:23     EKG Interpretation None      MDM   Final diagnoses:  Facial pain  Trigeminal neuralgia    CT of head and maxillofacial social sinus abnormalities. No intracranial abnormalities. With her distribution of pain in left upper face and otherwise normal exam imaging this may be an episode or  syndrome of trigeminal neuralgia. We'll start her on some Neurontin until she is able to followup with her primary care physician.    Tanna Furry, MD 05/27/14 1538

## 2014-05-27 NOTE — Telephone Encounter (Signed)
Dr.Copland, Pt states that she is still having severe head pain and she would like to have a CT scan, Best# [phone]

## 2014-05-27 NOTE — ED Notes (Signed)
Pt presents with 1 month h/o L temporal and frontal headache.  Pt reports she has been treated for sinus infections with abx and multiple OTC allergy medications with no relief. Pt reports h/o migraines with these symptoms different.  +nausea with dizziness that began today; +photophobia.  Pt reports this is worst headache of her life. Pt denies any weakness, reports pain radiates from L side of head into neck.

## 2014-05-29 NOTE — Telephone Encounter (Signed)
FYI - Pt has been seen in the ED and had a CT scan.

## 2014-05-30 ENCOUNTER — Encounter: Payer: Self-pay | Admitting: Family Medicine

## 2014-06-20 ENCOUNTER — Encounter: Payer: Self-pay | Admitting: *Deleted

## 2014-06-22 ENCOUNTER — Encounter (INDEPENDENT_AMBULATORY_CARE_PROVIDER_SITE_OTHER): Payer: Self-pay

## 2014-06-22 ENCOUNTER — Ambulatory Visit (INDEPENDENT_AMBULATORY_CARE_PROVIDER_SITE_OTHER): Payer: 59 | Admitting: Neurology

## 2014-06-22 ENCOUNTER — Encounter: Payer: Self-pay | Admitting: Neurology

## 2014-06-22 VITALS — BP 147/80 | HR 97 | Ht 63.5 in | Wt 278.0 lb

## 2014-06-22 DIAGNOSIS — G500 Trigeminal neuralgia: Secondary | ICD-10-CM | POA: Insufficient documentation

## 2014-06-22 NOTE — Progress Notes (Signed)
[facility]    Provider:  Dr Janann Colonel Referring Provider: Radene Ou Bill Salinas, MD Primary Care Physician:  Milagros Evener, MD  CC:  Facial pain  HPI:  Vanessa [name] is a 53 y.o. female here as a referral from Dr. Radene Ou for facial pain  Symptoms started around 2 years ago, has been intermittent since then. Had initially thought it was sinus/allergy related and was treated for this. Has gotten progressively worse recently. Symptoms are only on left side. Starts periauricular, radiates out into V2 distribution mainly but can involve V1-V3 when severe. Described as severe clenching pain, the worst pain of her life. Pain got constant at its worst. Symptoms worsened with air conditioning blowing on it. Not worse with chewing, hot foods or light touch. No history of visual changes, no blurry vision, no loss of vision. No facial weakness. No focal motor or sensory changes of extremities.   Was recently started on gabapentin and this has improved her pain. Notes that it makes her feel tired. Had head CT with contrast and maxillofacial CT on 05/2014 both of which were normal.   Review of Systems: Out of a complete 14 system review, the patient complains of only the following symptoms, and all other reviewed systems are negative. + facial pain, extremity swelling, fatigue  History   Social History  . Marital Status: Single    Spouse Name: N/A    Number of Children: 2  . Years of Education: 12+   Occupational History  .  [employer]   Social History Main Topics  . Smoking status: Never Smoker   . Smokeless tobacco: Never Used  . Alcohol Use: No  . Drug Use: No  . Sexual Activity: No   Other Topics Concern  . Not on file   Social History Narrative   Patient is single.    Patient has 2 children.    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 Diabetes Mother   . Hypertension  Mother   . Asthma Daughter   . Diabetes Sister   . Hypertension Sister     Past Medical History  Diagnosis Date  . Allergy   . Arthritis   . Anemia   . Hypertension     Past Surgical History  Procedure Laterality Date  . Breast surgery      biopsy  . Abdominal hysterectomy    . Meniscus repair      L knee  . Hysterotomy    . Eye surgery      Current Outpatient Prescriptions  Medication Sig Dispense Refill  . Aspirin-Acetaminophen-Caffeine (EXCEDRIN MIGRAINE PO) Take 2 tablets by mouth daily as needed.      . diclofenac (VOLTAREN) 75 MG EC tablet Take 75 mg by mouth 2 (two) times daily as needed.       . gabapentin (NEURONTIN) 300 MG capsule Take 1 capsule (300 mg total) by mouth 3 (three) times daily.  60 capsule  0  . ibuprofen (ADVIL,MOTRIN) 200 MG tablet Take 800 mg by mouth every 6 (six) hours as needed for pain.        No current facility-administered medications for this visit.    Allergies as of 06/22/2014 - Review Complete 05/27/2014  Allergen Reaction Noted  . Penicillins Anaphylaxis 02/16/2012  . Codeine Rash 02/16/2012  . Darvocet [propoxyphene n-acetaminophen] Rash 02/16/2012  .  Demerol Rash 02/16/2012  . Erythromycin Rash 02/16/2012  . Flexeril [cyclobenzaprine hcl] Rash 02/16/2012  . Percocet [oxycodone-acetaminophen] Itching and Rash 04/23/2014  . Sulfa antibiotics Rash 02/16/2012    Vitals: BP 147/80  Pulse 97  Ht 5' 3.5" (1.613 m)  Wt 278 lb (126.1 kg)  BMI 48.47 kg/m2 Last Weight:  Wt Readings from Last 1 Encounters:  06/22/14 278 lb (126.1 kg)   Last Height:   Ht Readings from Last 1 Encounters:  06/22/14 5' 3.5" (1.613 m)     Physical exam: Exam: Gen: NAD, conversant Eyes: anicteric sclerae, moist conjunctivae HENT: Atraumatic, oropharynx clear Neck: Trachea midline; supple,  Lungs: CTA, no wheezing, rales, rhonic                          CV: RRR, no MRG Abdomen: Soft, non-tender;  Extremities: No peripheral edema  Skin:  Normal temperature, no rash,  Psych: Appropriate affect, pleasant  Neuro: MS: AA&Ox3, appropriately interactive, normal affect   Speech: fluent w/o paraphasic error  Memory: good recent and remote recall  CN: PERRL, EOMI no nystagmus, no ptosis, sensation intact to LT V1-V3 bilat, face symmetric, tenderness to palpation over V2 distribution on the left, no weakness, hearing grossly intact, palate elevates symmetrically, shoulder shrug 5/5 bilat,  tongue protrudes midline, no fasiculations noted.  Motor: normal bulk and tone Strength: 5/5  In all extremities  Coord: rapid alternating and point-to-point (FNF, HTS) movements intact.  Reflexes: symmetrical, bilat downgoing toes  Sens: LT intact in all extremities  Gait: posture, stance, stride and arm-swing normal. Tandem gait intact. Able to walk on heels and toes. Romberg absent.   Assessment:  After physical and neurologic examination, review of laboratory studies, imaging, neurophysiology testing and pre-existing records, assessment will be reviewed on the problem list.  Plan:  Treatment plan and additional workup will be reviewed under Problem List.  1)Facial pain: consistent with trigeminal neuralgia  53y/o woman presenting for initial evaluation of facial pain consistent with a diagnosis of trigeminal neuralgia. She was recently started on gabapentin 300mg  TID by the ED with good benefit. Notes some fatigue and extremity edema but otherwise tolerating well. Discussed diagnosis in detail with the patient who expressed understanding. She is currently getting good benefit from gabapentin, offered additional therapeutic options (tegretol and baclofen) but she wishes to stay on gabapentin. Will try to slowly titrate up the dose to see if she can get additional relief. Follow up as needed.  Jim Like, DO  [facility] [address] [place], [place] [address]  Phone [phone] Fax  [phone]

## 2014-06-22 NOTE — Patient Instructions (Signed)
Overall you are doing fairly well but I do want to suggest a few things today:   Remember to drink plenty of fluid, eat healthy meals and do not skip any meals. Try to eat protein with a every meal and eat a healthy snack such as fruit or nuts in between meals. Try to keep a regular sleep-wake schedule and try to exercise daily, particularly in the form of walking, 20-30 minutes a day, if you can.   Your symptoms are most consistent with a diagnosis of trigeminal neuralgia  As far as your medications are concerned, I would like to suggest trying to increase the gabapentin slightly. You can try taking 300mg  four times a day or try increasing your doses to 600mg  three times a day. Please call me if you have further questions about medication adjustment.   Follow up as needed. Please call us with any interim questions, concerns, problems, updates or refill requests.   My clinical assistant and will answer any of your questions and relay your messages to me and also relay most of my messages to you.   Our phone number is [phone]. We also have an after hours call service for urgent matters and there is a physician on-call for urgent questions. For any emergencies you know to call 911 or go to the nearest emergency room

## 2014-06-27 ENCOUNTER — Ambulatory Visit (INDEPENDENT_AMBULATORY_CARE_PROVIDER_SITE_OTHER): Payer: 59 | Admitting: Podiatry

## 2014-06-27 ENCOUNTER — Ambulatory Visit (INDEPENDENT_AMBULATORY_CARE_PROVIDER_SITE_OTHER): Payer: 59

## 2014-06-27 ENCOUNTER — Encounter: Payer: Self-pay | Admitting: Podiatry

## 2014-06-27 VITALS — BP 137/80 | HR 101 | Resp 15 | Ht 64.0 in | Wt 273.0 lb

## 2014-06-27 DIAGNOSIS — R52 Pain, unspecified: Secondary | ICD-10-CM

## 2014-06-27 DIAGNOSIS — S93609A Unspecified sprain of unspecified foot, initial encounter: Secondary | ICD-10-CM

## 2014-06-27 NOTE — Patient Instructions (Signed)
Foot Sprain The muscles and cord like structures which attach muscle to bone (tendons) that surround the feet are made up of units. A foot sprain can occur at the weakest spot in any of these units. This condition is most often caused by injury to or overuse of the foot, as from playing contact sports, or aggravating a previous injury, or from poor conditioning, or obesity. SYMPTOMS  Pain with movement of the foot.  Tenderness and swelling at the injury site.  Loss of strength is present in moderate or severe sprains. THE THREE GRADES OR SEVERITY OF FOOT SPRAIN ARE:  Mild (Grade I): Slightly pulled muscle without tearing of muscle or tendon fibers or loss of strength.  Moderate (Grade II): Tearing of fibers in a muscle, tendon, or at the attachment to bone, with small decrease in strength.  Severe (Grade III): Rupture of the muscle-tendon-bone attachment, with separation of fibers. Severe sprain requires surgical repair. Often repeating (chronic) sprains are caused by overuse. Sudden (acute) sprains are caused by direct injury or over-use. DIAGNOSIS  Diagnosis of this condition is usually by your own observation. If problems continue, a caregiver may be required for further evaluation and treatment. X-rays may be required to make sure there are not breaks in the bones (fractures) present. Continued problems may require physical therapy for treatment. PREVENTION  Use strength and conditioning exercises appropriate for your sport.  Warm up properly prior to working out.  Use athletic shoes that are made for the sport you are participating in.  Allow adequate time for healing. Early return to activities makes repeat injury more likely, and can lead to an unstable arthritic foot that can result in prolonged disability. Mild sprains generally heal in 3 to 10 days, with moderate and severe sprains taking 2 to 10 weeks. Your caregiver can help you determine the proper time required for  healing. HOME CARE INSTRUCTIONS   Apply ice to the injury for 15-20 minutes, 03-04 times per day. Put the ice in a plastic bag and place a towel between the bag of ice and your skin.  An elastic wrap (like an Ace bandage) may be used to keep swelling down.  Keep foot above the level of the heart, or at least raised on a footstool, when swelling and pain are present.  Try to avoid use other than gentle range of motion while the foot is painful. Do not resume use until instructed by your caregiver. Then begin use gradually, not increasing use to the point of pain. If pain does develop, decrease use and continue the above measures, gradually increasing activities that do not cause discomfort, until you gradually achieve normal use.  Use crutches if and as instructed, and for the length of time instructed.  Keep injured foot and ankle wrapped between treatments.  Massage foot and ankle for comfort and to keep swelling down. Massage from the toes up towards the knee.  Only take over-the-counter or prescription medicines for pain, discomfort, or fever as directed by your caregiver. SEEK IMMEDIATE MEDICAL CARE IF:   Your pain and swelling increase, or pain is not controlled with medications.  You have loss of feeling in your foot or your foot turns cold or blue.  You develop new, unexplained symptoms, or an increase of the symptoms that brought you to your caregiver. MAKE SURE YOU:   Understand these instructions.  Will watch your condition.  Will get help right away if you are not doing well or get worse. Document Released:   05/17/2002 Document Revised: 02/17/2012 Document Reviewed: 07/14/2008 ExitCare Patient Information 2015 [employer]. This information is not intended to replace advice given to you by your health care provider. Make sure you discuss any questions you have with your health care provider.  

## 2014-06-27 NOTE — Progress Notes (Signed)
   Subjective:    Patient ID: Vanessa [name], female    DOB: August 21, 1961, 53 y.o.   MRN: [id number]  HPI Comments: N foot injury L left 2nd toe and metatarsal D 06/25/2014 O stepped wrong on stairs C burning pain A plantar flex toes and weight bearing T Ice, rest and elevation  Foot Injury       Review of Systems  Constitutional: Positive for unexpected weight change.       Weight gain due to Neurotin per pt.  All other systems reviewed and are negative.      Objective:   Physical Exam  Orientated x3 black female  Vascular: DP and PT pulses 2/4 bilaterally  Neurological: Ankle and knee reflexes equal and reactive bilaterally  Dermatological: Texture and turgor within normal limits Plantar keratoses subsecond MPJ bilaterally There is no erythema, edema or ecchymosis noted in the left foot  Musculoskeletal: Manual motor testing Dorsi flexion and plantar flexion 5/5 bilaterally Palpable tenderness in the second third, fourth MPJ area without a palpable lesions  X-ray examination weightbearing left foot  Intact bony structure without fracture and/or dislocation noted  Metatarsus adductus  Posterior and inferior calcaneal spurs  Radiographic impression:  No acute bony abnormality noted in the left foot      Assessment & Plan:   Assessment: Sprain left foot  Plan: Dispensed surgical shoe for patient to wear on the left foot until symptoms resolve

## 2014-06-28 ENCOUNTER — Encounter: Payer: Self-pay | Admitting: Podiatry

## 2014-07-06 ENCOUNTER — Telehealth: Payer: Self-pay | Admitting: Neurology

## 2014-07-06 ENCOUNTER — Other Ambulatory Visit: Payer: Self-pay | Admitting: Neurology

## 2014-07-06 MED ORDER — GABAPENTIN 600 MG PO TABS
600.0000 mg | ORAL_TABLET | Freq: Three times a day (TID) | ORAL | Status: DC
Start: 2014-07-06 — End: 2015-04-25

## 2014-07-06 NOTE — Telephone Encounter (Signed)
Prescription has been sent.

## 2014-07-06 NOTE — Telephone Encounter (Signed)
Patient calling wanting to discuss how she's doing since her medication dosage has been increased, patient states it is ok to leave a voicemail message if unable. Please return call and advise.

## 2014-07-06 NOTE — Telephone Encounter (Signed)
Called pt and pt stated that she is doing well with the increase of gabapentin 600 mg 3 times daily. Pt also states that she needs a Rx written for gabapentin 600 mg taken 3 times daily because she is running out. Pt would like this Rx sent to her pharmacy [facility] on [address], [place], [place]. Please advise

## 2014-07-06 NOTE — Telephone Encounter (Signed)
Called pt to inform her that her Rx was already sent to her pharmacy and if she has any other problems, questions or concerns to call the office. Pt verbalized understanding.

## 2014-07-18 ENCOUNTER — Encounter: Payer: Self-pay | Admitting: Podiatry

## 2014-07-18 ENCOUNTER — Ambulatory Visit (INDEPENDENT_AMBULATORY_CARE_PROVIDER_SITE_OTHER): Payer: 59 | Admitting: Podiatry

## 2014-07-18 ENCOUNTER — Ambulatory Visit (INDEPENDENT_AMBULATORY_CARE_PROVIDER_SITE_OTHER): Payer: 59

## 2014-07-18 VITALS — BP 141/90 | HR 93 | Resp 13 | Ht 64.0 in | Wt 271.0 lb

## 2014-07-18 DIAGNOSIS — M779 Enthesopathy, unspecified: Secondary | ICD-10-CM

## 2014-07-18 DIAGNOSIS — R52 Pain, unspecified: Secondary | ICD-10-CM

## 2014-07-18 DIAGNOSIS — B351 Tinea unguium: Secondary | ICD-10-CM

## 2014-07-18 DIAGNOSIS — M79609 Pain in unspecified limb: Secondary | ICD-10-CM

## 2014-07-18 MED ORDER — PREDNISONE 10 MG PO KIT: PACK | ORAL | Status: DC

## 2014-07-18 NOTE — Patient Instructions (Addendum)
Wear surgical shoe on the left foot until pain and swelling ends Start Dosepak and Tuesday August 11

## 2014-07-19 ENCOUNTER — Encounter: Payer: Self-pay | Admitting: Podiatry

## 2014-07-19 NOTE — Progress Notes (Signed)
Patient ID: Vanessa [name], female   DOB: 1961/10/03, 53 y.o.   MRN: [id number]  Subjective: This patient presents again complaining of a painful left foot localized around the second toe and plantar second to fourth MPJ area. She has been wearing a surgical shoe since the visit of 06/27/2014.  Objective: Orientated x3 female  Vascular: DP and PT pulses 2/4 bilaterally  Dermatological: Localized edema plantar second to fourth MPJ area  Musculoskeletal: Tenderness on range of motion second left MPJ without crepitus Palpable tenderness plantar second MPJ, dorsal second, third, fourth metatarsal left foot  X-ray examination left foot  Intact bony structure without fracture and or dislocation  Metatarsus adductus  Posterior and inferior calcaneal spurs  Hammer toe deformity second  Radiographic impression:  No acute bony abnormality noted in left foot  Assessment: Capsulitis second left MPJ  Plan: Continue wearing surgical shoe and left foot Rx oral 10 mg prednisone 6 day tapering Dosepak  Reevaluate x1 month If no improvement consider ordering arthritic profile

## 2014-08-29 ENCOUNTER — Encounter: Payer: Self-pay | Admitting: Podiatry

## 2014-08-29 ENCOUNTER — Ambulatory Visit (INDEPENDENT_AMBULATORY_CARE_PROVIDER_SITE_OTHER): Payer: 59 | Admitting: Podiatry

## 2014-08-29 VITALS — BP 127/71 | HR 91 | Resp 18

## 2014-08-29 DIAGNOSIS — M779 Enthesopathy, unspecified: Secondary | ICD-10-CM

## 2014-08-29 LAB — URIC ACID: URIC ACID, SERUM: 4.2 mg/dL (ref 2.4–7.0)

## 2014-08-29 LAB — C-REACTIVE PROTEIN: CRP: 2 mg/dL — AB (ref <0.60)

## 2014-08-29 LAB — RHEUMATOID FACTOR: Rhuematoid fact SerPl-aCnc: <10 IU/mL (ref <=14)

## 2014-08-29 LAB — SEDIMENTATION RATE: SED RATE: 16 mm/h (ref 0–22)

## 2014-08-29 NOTE — Patient Instructions (Signed)
Have your blood tests done, arthritis panel

## 2014-08-30 ENCOUNTER — Encounter: Payer: Self-pay | Admitting: Podiatry

## 2014-08-30 LAB — ANA: ANA: POSITIVE — AB

## 2014-08-30 LAB — ANTI-NUCLEAR AB-TITER (ANA TITER)

## 2014-08-30 NOTE — Progress Notes (Signed)
Patient ID: Vanessa [name], female   DOB: [birth year], 53 y.o.   MRN: [id number] Subjective: This patient presents again complaining of painful left foot describing some sharp and painful sensations occur on and off weightbearing in the left forefoot area at the symptoms are somewhat improved when she wears a surgical shoe. She completed a 6 day 10 mg prednisone describes some slight temporary relief of the symptoms in the left foot. She denies any other areas similar complaint. Her primary care physician is suggested possible neuroma as a possibility of her symptoms. She initially presented for this problem on 06/27/2014  Medical history unchanged from previous visits Medication unchanged from previous visits  Objective: Orientated x3  Vascular: DP and PT pulses 2/4 bilaterally  Dermatological: Texture and turgor within normal limits No erythema, edema or skin lesions noted  Neurological: Deferred  Musculoskeletal Palpable tenderness plantar left first MPJ, second, third, fourth MPJ without any palpable lesions Palpable tenderness in the second, third, fourth intermetatarsal spaces without any palpable lesions Tenderness on range of motion left third MPJ without crepitus  Assessment: Rule out inflammatory arthritis Diffuse metatarsalgia left forefoot undetermined origin  Plan: Patient referred to the lab for screening arthritis panel including ANA, C-reactive protein, rheumatoid factor, sedimentation rate and uric acid  Reappoint x7 days to review lab results with patient

## 2014-09-01 ENCOUNTER — Telehealth: Payer: Self-pay | Admitting: *Deleted

## 2014-09-01 NOTE — Telephone Encounter (Signed)
Message copied by Lolita Rieger on Thu Sep 01, 2014 12:00 PM ------      Message from: Gean Birchwood      Created: Wed Aug 31, 2014  9:58 AM       Arthritis panel dated 08/29/2014            ANA positive with titer 1:80 weekly positive usually not clinically significant      Homogeneous pattern            C. reactive protein elevated 2.0            Patient has scheduled visit within the next week and I will discuss the results with the patient at that time             ------

## 2014-09-01 NOTE — Telephone Encounter (Signed)
I called and left her a message that Dr. Amalia Hailey received your lab results.  He said that he will discuss it with you when you come in for your appointment on Monday.

## 2014-09-05 ENCOUNTER — Ambulatory Visit (INDEPENDENT_AMBULATORY_CARE_PROVIDER_SITE_OTHER): Payer: 59 | Admitting: Podiatry

## 2014-09-05 VITALS — BP 149/96 | HR 97 | Resp 16

## 2014-09-05 DIAGNOSIS — M779 Enthesopathy, unspecified: Secondary | ICD-10-CM

## 2014-09-05 NOTE — Patient Instructions (Signed)
Show results of arthritis panel to Dr. Zadie Rhine Return after Dr. Zadie Rhine evaluates the arthritis panel Continue wearing surgical shoe on left foot

## 2014-09-05 NOTE — Progress Notes (Signed)
   Subjective:    Patient ID: Vanessa [name], female    DOB: [birth year], 53 y.o.   MRN: [id number]  HPI This patient presents for ongoing painful left foot developing after a tripping injury occurring on 06/25/2014. Symptoms primarily occur on weightbearing, however, she still is complaining of discomfort in the left forefoot off weightbearing. She often describes sharp and painful sensations. A 6 day 10 mg prednisone Dosepak temporary reduces symptoms. She also said some partial relief when wearing a flat surgical shoe. On the visit of 08/29/2014 an arthritis panel was ordered. Patient presents today for followup evaluation of left foot pain and to review results of arthritis panel    Review of Systems  HENT: Positive for congestion.   Eyes: Positive for visual disturbance.  Musculoskeletal: Positive for gait problem.  Allergic/Immunologic: Positive for environmental allergies.  Hematological: Bruises/bleeds easily.  All other systems reviewed and are negative.      Objective:   Physical Exam Orientated x3  Vascular: DP and PT pulses 2/4 bilaterally  Dermatological: Texture and turgor within normal limits bilaterally  Neurological: Deferred  Musculoskeletal: Palpable tenderness plantar subsecond, suffered, sub-fourth left MPJ without any palpable lesions. Palpable tenderness in the first, second, third, intermetatarsal spaces without any palpable lesions Passive range of motion second left MPJ causes discomfort without crepitus There is no restriction ankle, subtalar, midtarsal joints bilaterally.  Results of lab dated 08/29/2014: Uric acid 4.2 within normal limits ANA positive ANA titer 1-80 (weakly positive, probably not clinically significant) ANA pattern homogeneous Rheumatoid factor: 14I U/ml  within normal limits Sedimentation rate: 16 within normal limits C. reactive protein: 2.0 elevated            Assessment & Plan:   Assessment: Left forefoot pain  associated with metatarsalgia/capsulitis Lab findings including elevated C. reactive protein and ANA titer most likely not associated with the left forefoot pain  Plan: Had a detailed discussion today with patient about the results of her lab. I made aware that I thought that most likely the elevated lab values of ANA and C-reactive protein probably were not associated with her left foot pain. I did recommend that she contact her primary care physician Dr.Rankin and discuss the abnormal lab findings at this time. I gave patient a copy of the lab results to present to Dr. Zadie Rhine.  I recommended that she maintain the surgical shoe on the left foot After Dr. Zadie Rhine evaluates the abnormal lab values and if she feels there is no correlation with lab values in the left foot pain I would continue treatment including further immobilization and possibly extended NSAID or prednisone. Also, upon her return I would like to repeat the left foot x-ray  Patient return after her cheek on consult with Dr. Zadie Rhine

## 2014-09-14 ENCOUNTER — Telehealth: Payer: Self-pay | Admitting: *Deleted

## 2014-09-14 NOTE — Telephone Encounter (Signed)
I'm having a lot of pain in my foot.  I'm wondering if Dr. Amalia Hailey can call me in something for pain or let me know what I need to take.  Thank you.

## 2014-09-15 NOTE — Telephone Encounter (Signed)
I called and informed her that Dr. Amalia Hailey is not in the office anymore this week.  Unfortunately no one else can prescribe anything for you.  She stated, "They made me aware of that when I called back to schedule an appointment."  I told her she can try Advil or Ibuprofen if she can tolerate it.  She stated, "I have some Diclofenac that I haven't been taking and I take Gabapentin, I guess it's okay to take both of those.  The pharmacist hasn't told me anything different.  I took one last night and have tried to stay off my foot as much.  It seems to have helped.  I'm scheduled to see him on Wednesday."  I told her that it's fine to take the Diclofenac.  She said, "Okay thanks for calling me back."

## 2014-09-21 ENCOUNTER — Ambulatory Visit (INDEPENDENT_AMBULATORY_CARE_PROVIDER_SITE_OTHER): Payer: 59 | Admitting: Podiatry

## 2014-09-21 ENCOUNTER — Encounter: Payer: Self-pay | Admitting: Podiatry

## 2014-09-21 VITALS — BP 134/89 | HR 96 | Resp 12

## 2014-09-21 DIAGNOSIS — M778 Other enthesopathies, not elsewhere classified: Secondary | ICD-10-CM

## 2014-09-21 DIAGNOSIS — M779 Enthesopathy, unspecified: Secondary | ICD-10-CM

## 2014-09-21 NOTE — Patient Instructions (Signed)
Wear walking shoes on a continuous basis If foot pain increases use diclofenac 75 mg twice a day x1 week and then stop taking

## 2014-09-22 NOTE — Progress Notes (Signed)
Patient ID: Vanessa [name], female   DOB: 11/08/1961, 53 y.o.   MRN: [id number]  Subjective:  HPI  This patient presents for ongoing painful left foot developing after a tripping injury occurring on 06/25/2014. Symptoms primarily occur on weightbearing, however, she still is complaining of discomfort in the left forefoot off weightbearing. She often describes sharp and painful sensations. A 6 day 10 mg prednisone Dosepak temporary reduces symptoms. She also said some partial relief when wearing a flat surgical shoe. On the visit of 08/29/2014 an arthritis panel was ordered. Patient presents today for followup evaluation of left foot pain and to review results of arthritis panel   Arthritis panel demonstrated elevated values and on the visit of 09/05/2014 I referred patient back to primary care physician Dr. Zadie Rhine to evaluate these abnormal values. According to patient Dr. Zadie Rhine felt that the values were clinically insignificant and no followup is needed.  At this time patient's left foot seems to be improving when she standing and walking with significant reduction of sharp painful sensations in the left foot  Objective: Vascular: DP and PT pulses 2/bilaterally No edema noted bilaterally  Dermatological: Mild reactive hypertrophy plantar skin subsecond MPJ bilaterally Texture and turgor within normal limits bilaterally  Musculoskeletal: Palpable tenderness in the second and third left MPJ plantarly , without any palpable lesions Mild palpable tenderness in the second and third left intermetatarsal spaces, without any palpable lesions No tenderness on range of motion of the MPJs 1-5 left  Neurological: Deferred   Assessment: Improving metatarsalgia, capsulitis left MPJs  Plan: Patient advised to wear athletic style shoes on a continuous basis She has an existing prescription for diclofenac 75 mg twice a day when necessary for knee pain.  If foot pain exacerbates recommended taking  diclofenac 75 mg twice a day x7 days as needed  Reappoint at patient's request

## 2015-04-04 ENCOUNTER — Other Ambulatory Visit: Payer: Self-pay | Admitting: Neurology

## 2015-04-25 ENCOUNTER — Ambulatory Visit (INDEPENDENT_AMBULATORY_CARE_PROVIDER_SITE_OTHER): Payer: BLUE CROSS/BLUE SHIELD | Admitting: Neurology

## 2015-04-25 ENCOUNTER — Encounter: Payer: Self-pay | Admitting: Neurology

## 2015-04-25 VITALS — BP 171/102 | HR 89 | Ht 64.0 in | Wt 284.0 lb

## 2015-04-25 DIAGNOSIS — G500 Trigeminal neuralgia: Secondary | ICD-10-CM | POA: Diagnosis not present

## 2015-04-25 MED ORDER — GABAPENTIN 600 MG PO TABS: 600.0000 mg | ORAL_TABLET | Freq: Three times a day (TID) | ORAL | Status: DC

## 2015-04-25 NOTE — Progress Notes (Signed)
Chief Complaint  Patient presents with  . Trigeminal Neuralgia    Reports she is still having left-sided facial pain.  She reduced her gabapentin 600mg  to twice daily but her symptoms started to worsen.      [facility]    Provider:  Dr Janann Colonel Referring Provider: Aretta Nip, MD Primary Care Physician:  Milagros Evener, MD  CC:  Facial pain  HPI:  Vanessa [name] is a 54 y.o. female here as a referral from Dr. Radene Ou for facial pain, she was patient of Dr. Janann Colonel, last visit was May 2015,  Around 2014, she began to have intermittent left facial pain, starting left periauricular area, radiating to left cheek, left forehead,mainly involves left V1, V2, spares left V3 branch,  She described the pain as severe shooting pain,  gabapentin 300 mg 3 times a day works well for her left facial pain  Because improvement of her pain, she gradually tapered gabapentin to 300 twice a day, tapering down the gabapentin causing recurrent left facial sensitivity, recent few months, she also noticed frequent left lower eyelid twitching, she denies hearing loss, no rash broke out   I have personally reviewed her CAT scan of the head without contrast in June 2015, no intracranial abnormality, Probable periapical abscess of tooth 8 without other maxillofacial abnormality. No evidence of paranasal or mastoid sinus disease.  Review of Systems: Out of a complete 14 system review, the patient complains of only the following symptoms, and all other reviewed systems are negative. As above   History   Social History  . Marital Status: Single    Spouse Name: N/A  . Number of Children: 2  . Years of Education: 12+   Occupational History  .  [employer]   Social History Main Topics  . Smoking status: Never Smoker   . Smokeless tobacco: Never Used  . Alcohol Use: No  . Drug Use: No  . Sexual Activity: No   Other Topics Concern  . Not on file   Social History Narrative   Patient is single.    Patient has 2 children.    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 Diabetes Mother   . Hypertension Mother   . Asthma Daughter   . Diabetes Sister   . Hypertension Sister     Past Medical History  Diagnosis Date  . Allergy   . Arthritis   . Anemia   . Hypertension   . Trigeminal neuralgia 05/2014    Past Surgical History  Procedure Laterality Date  . Breast surgery      biopsy  . Abdominal hysterectomy    . Meniscus repair      L knee  . Hysterotomy    . Eye surgery      Current Outpatient Prescriptions  Medication Sig Dispense Refill  . diclofenac (VOLTAREN) 75 MG EC tablet Take 75 mg by mouth 2 (two) times daily as needed.     Marland Kitchen DICLOFENAC PO Take by mouth as needed.    . gabapentin (NEURONTIN) 600 MG tablet Take 1 tablet (600 mg total) by mouth 3 (three) times daily. 90 tablet 6   No current facility-administered medications for this visit.    Allergies as of 04/25/2015 - Review Complete 04/25/2015  Allergen Reaction Noted  . Penicillins Anaphylaxis 02/16/2012  . Codeine Rash 02/16/2012  .  Darvocet [propoxyphene n-acetaminophen] Rash 02/16/2012  . Demerol Rash 02/16/2012  . Erythromycin Rash 02/16/2012  . Flexeril [cyclobenzaprine hcl] Rash 02/16/2012  . Percocet [oxycodone-acetaminophen] Itching and Rash 04/23/2014  . Sulfa antibiotics Rash 02/16/2012    Vitals: BP 171/102 mmHg  Pulse 89  Ht 5\' 4"  (1.626 m)  Wt 284 lb (128.822 kg)  BMI 48.72 kg/m2 Last Weight:  Wt Readings from Last 1 Encounters:  04/25/15 284 lb (128.822 kg)   Last Height:   Ht Readings from Last 1 Encounters:  04/25/15 5\' 4"  (1.626 m)   PHYSICAL EXAMNIATION:  Gen: NAD, conversant, well nourised, obese, well groomed                     Cardiovascular: Regular rate rhythm, no peripheral edema, warm, nontender. Eyes: Conjunctivae clear without exudates or  hemorrhage Neck: Supple, no carotid bruise. Pulmonary: Clear to auscultation bilaterally   NEUROLOGICAL EXAM:  MENTAL STATUS: Speech:    Speech is normal; fluent and spontaneous with normal comprehension.  Cognition:    The patient is oriented to person, place, and time;     recent and remote memory intact;     language fluent;     normal attention, concentration,     fund of knowledge.  CRANIAL NERVES: CN II: Visual fields are full to confrontation. Fundoscopic exam is normal with sharp discs and no vascular changes. Venous pulsations are present bilaterally. Pupils are 4 mm and briskly reactive to light. Visual acuity is 20/20 bilaterally. CN III, IV, VI: extraocular movement are normal. No ptosis. CN V: Left facial hypersensitivity involving left V1, V2 branches, Corneal responses are intact.  CN VII: Face is symmetric with normal eye closure and smile. CN VIII: Hearing is normal to rubbing fingers CN IX, X: Palate elevates symmetrically. Phonation is normal. CN XI: Head turning and shoulder shrug are intact CN XII: Tongue is midline with normal movements and no atrophy.  MOTOR: There is no pronator drift of out-stretched arms. Muscle bulk and tone are normal. Muscle strength is normal.  REFLEXES: Reflexes are 2+ and symmetric at the biceps, triceps, knees, and ankles. Plantar responses are flexor.  SENSORY: Light touch, pinprick, position sense, and vibration sense are intact in fingers and toes.  COORDINATION: Rapid alternating movements and fine finger movements are intact. There is no dysmetria on finger-to-nose and heel-knee-shin. There are no abnormal or extraneous movements.   GAIT/STANCE: Posture is normal. Gait is steady with normal steps, base, arm swing, and turning. Heel and toe walking are normal. Tandem gait is normal.  Romberg is absent.  Assessment/Plan:   Left facial trigeminal neuralgia Responded well to gabapentin 300 mg 3 times a day, She had a  persistent left facial pain for 2 years now, mainly involving left V1, V2 branch, also increase left lower eyelid twitching, suggestive of left VII, will proceed with MRI brain w/wo contrast,  Return to clinic in one year, I will call her MRI report.  Marcial Pacas, M.D. Ph.D.  [facility] [place], [place] [address] Phone: [phone] Fax:      [phone]

## 2015-04-26 LAB — BASIC METABOLIC PANEL
BUN/Creatinine Ratio: 18 (ref 9–23)
BUN: 13 mg/dL (ref 6–24)
CHLORIDE: 103 mmol/L (ref 97–108)
CO2: 27 mmol/L (ref 18–29)
Calcium: 9.2 mg/dL (ref 8.7–10.2)
Creatinine, Ser: 0.71 mg/dL (ref 0.57–1.00)
GFR, EST AFRICAN AMERICAN: 112 mL/min/{1.73_m2} (ref >59)
GFR, EST NON AFRICAN AMERICAN: 98 mL/min/{1.73_m2} (ref >59)
GLUCOSE: 89 mg/dL (ref 65–99)
POTASSIUM: 4.2 mmol/L (ref 3.5–5.2)
Sodium: 143 mmol/L (ref 134–144)

## 2015-05-02 ENCOUNTER — Telehealth: Payer: Self-pay | Admitting: *Deleted

## 2015-05-02 NOTE — Telephone Encounter (Signed)
Chart opened in error

## 2015-05-24 ENCOUNTER — Ambulatory Visit (INDEPENDENT_AMBULATORY_CARE_PROVIDER_SITE_OTHER): Payer: BLUE CROSS/BLUE SHIELD

## 2015-05-24 DIAGNOSIS — G500 Trigeminal neuralgia: Secondary | ICD-10-CM | POA: Diagnosis not present

## 2015-05-25 MED ORDER — GADOPENTETATE DIMEGLUMINE 469.01 MG/ML IV SOLN: 20.0000 mL | Freq: Once | INTRAVENOUS | Status: AC | PRN

## 2015-11-07 ENCOUNTER — Other Ambulatory Visit: Payer: Self-pay | Admitting: Obstetrics and Gynecology

## 2015-11-07 DIAGNOSIS — R928 Other abnormal and inconclusive findings on diagnostic imaging of breast: Secondary | ICD-10-CM

## 2015-11-13 ENCOUNTER — Other Ambulatory Visit: Payer: Self-pay | Admitting: Obstetrics and Gynecology

## 2015-11-13 ENCOUNTER — Ambulatory Visit
Admission: RE | Admit: 2015-11-13 | Discharge: 2015-11-13 | Disposition: A | Discharge status: Home / Self Care | Payer: BLUE CROSS/BLUE SHIELD | Source: Ambulatory Visit | Attending: Obstetrics and Gynecology | Admitting: Obstetrics and Gynecology

## 2015-11-13 DIAGNOSIS — R928 Other abnormal and inconclusive findings on diagnostic imaging of breast: Secondary | ICD-10-CM

## 2015-11-13 DIAGNOSIS — N6310 Unspecified lump in the right breast, unspecified quadrant: Secondary | ICD-10-CM

## 2015-11-15 ENCOUNTER — Ambulatory Visit
Admission: RE | Admit: 2015-11-15 | Discharge: 2015-11-15 | Disposition: A | Discharge status: Home / Self Care | Payer: BLUE CROSS/BLUE SHIELD | Source: Ambulatory Visit | Attending: Obstetrics and Gynecology | Admitting: Obstetrics and Gynecology

## 2015-11-15 ENCOUNTER — Other Ambulatory Visit: Payer: Self-pay | Admitting: Obstetrics and Gynecology

## 2015-11-15 DIAGNOSIS — N6310 Unspecified lump in the right breast, unspecified quadrant: Secondary | ICD-10-CM

## 2015-11-16 ENCOUNTER — Ambulatory Visit
Admission: RE | Admit: 2015-11-16 | Discharge: 2015-11-16 | Disposition: A | Discharge status: Home / Self Care | Payer: BLUE CROSS/BLUE SHIELD | Source: Ambulatory Visit | Attending: Obstetrics and Gynecology | Admitting: Obstetrics and Gynecology

## 2015-11-16 DIAGNOSIS — N6310 Unspecified lump in the right breast, unspecified quadrant: Secondary | ICD-10-CM

## 2015-11-20 ENCOUNTER — Ambulatory Visit: Payer: Self-pay | Admitting: Surgery

## 2015-11-20 DIAGNOSIS — C50121 Malignant neoplasm of central portion of right male breast: Secondary | ICD-10-CM

## 2015-11-20 NOTE — H&P (Signed)
Vanessa [name] 11/20/2015 2:30 PM Location: [facility] Patient #: [id number] DOB: Apr 30, 1961 Single / Language: [name] / Race: Black or African American Female  History of Present Illness Marcello Moores A. Melven Stockard MD; 11/20/2015 3:42 PM) Patient words: right breast cancer   Patient sent at the request of Dr. Glennon Mac for right breast cancer. This is picked up on screening mammogram. 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 Receptors PENDING. She has 2 cousins had breast cancer in her 53s. She has a history of colon cancer in the family. Denies any history of breast pain, breast mass or nipple discharge. She was experiencing some right breast discomfort prior to her mammogram.            Breast, right, needle core biopsy, 10:00 o'clock - INVASIVE DUCTAL CARCINOMA. - DUCTAL CARCINOMA IN SITU. - ASSOCIATED CALCIFICATION. - SEE COMMENT.    CLINICAL DATA: Post right breast ultrasound-guided biopsy.  EXAM: DIAGNOSTIC RIGHT MAMMOGRAM POST ULTRASOUND BIOPSY  COMPARISON: Previous exam(s).  FINDINGS: Mammographic images were obtained following ultrasound guided biopsy of the mass located within the right breast at the 10 o'clock position. The ribbon shaped clip is in appropriate position.  IMPRESSION: Appropriate position of clip following right breast ultrasound-guided biopsy.  Final Assessment: Post Procedure Mammograms for Marker Placement   Electronically Signed By: Altamese Cabal M.D. On: 11/15/2015 16     CLINICAL DATA: Recall from screening mammogram.  EXAM: DIGITAL DIAGNOSTIC RIGHT MAMMOGRAM WITH CAD  ULTRASOUND RIGHT BREAST  COMPARISON: Previous exam(s).  ACR Breast Density Category b: There are scattered areas of fibroglandular density.  FINDINGS: There is a small irregular mass present locating within the upper-outer quadrant of the right  breast at the 10 o'clock position approximately 7 cm from the nipple. This does contain suspicious internal calcifications.  Mammographic images were processed with CAD.  On physical exam, there is a small, mobile, firm mass present located within the right breast at 10 o'clock position 7 cm from the nipple. There is no palpable right axillary adenopathy.  Targeted ultrasound is performed, showing an irregularly marginated hypoechoic mass located within the right breast at the 10 o'clock position 7 cm from the nipple corresponding to the mammographic and palpable finding. This measures 11 x 10 x 7 mm in size and and is suspicious for invasive mammary carcinoma. Tissue sampling via ultrasound-guided core biopsy is recommended and will be scheduled.  Ultrasound of the right axilla demonstrates normal axillary contents and no evidence for adenopathy.  IMPRESSION: 1.1 cm irregular mass located within the right breast at 10 o'clock position 7 cm from the nipple. Tissue sampling via ultrasound-guided core biopsy is recommended and will be scheduled.  RECOMMENDATION: Right breast ultrasound-guided core biopsy.  I have discussed the findings and recommendations with the patient. Results were also provided in writing at the conclusion of the visit. If applicable, a reminder letter will be sent to the patient regarding the next appointment.  BI-RADS CATEGORY 4: Suspicious.   Electronically Signed By: Altamese Cabal M.D. On: 11/13/2015 11:08.  The patient is a 54 year old female.   Other Problems Ventura Sellers, [place]; 11/20/2015 2:32 PM) Arthritis Breast Cancer Lump In Breast  Past Surgical History Ventura Sellers, [place]; 11/20/2015 2:32 PM) Breast Biopsy Right. Hysterectomy (not due to cancer) - Complete Knee Surgery Left. Oral Surgery  Diagnostic Studies History Ventura Sellers, [place]; 11/20/2015 2:32 PM) Colonoscopy 1-5 years ago Mammogram  within  last year  Allergies Ventura Sellers, CMA; 11/20/2015 2:33 PM) Penicillin G Potassium *PENICILLINS* Codeine Phosphate *ANALGESICS - OPIOID* Darvocet A500 *ANALGESICS - OPIOID* Demerol *ANALGESICS - OPIOID* Erythromycin *DERMATOLOGICALS* Flexeril *MUSCULOSKELETAL THERAPY AGENTS* Percocet *ANALGESICS - OPIOID* Sulfa 10 *OPHTHALMIC AGENTS*  Medication History Ventura Sellers, CMA; 11/20/2015 2:34 PM) Diclofenac Sodium (75MG  Tablet DR, Oral) Active. Gabapentin (600MG  Tablet, Oral) Active. Medications Reconciled  Social History Ventura Sellers, [place]; 11/20/2015 2:32 PM) Alcohol use Occasional alcohol use. Caffeine use Carbonated beverages, Coffee, Tea. No drug use Tobacco use Never smoker.  Family History Ventura Sellers, [place]; 11/20/2015 2:32 PM) Alcohol Abuse Father. Arthritis Mother. Colon Cancer Sister. Diabetes Mellitus Mother, Sister. Hypertension Mother, Sister. Respiratory Condition Father. Thyroid problems Sister.  Pregnancy / Birth History Ventura Sellers, [place]; 11/20/2015 2:32 PM) Age at menarche 62 years. Gravida 2 Maternal age 65-25 Para 2     Review of Systems (Bell. Brooks CMA; 11/20/2015 2:32 PM) General Not Present- Appetite Loss, Chills, Fatigue, Fever, Night Sweats, Weight Gain and Weight Loss. Skin Not Present- Change in Wart/Mole, Dryness, Hives, Jaundice, New Lesions, Non-Healing Wounds, Rash and Ulcer. HEENT Present- Wears glasses/contact lenses. Not Present- Earache, Hearing Loss, Hoarseness, Nose Bleed, Oral Ulcers, Ringing in the Ears, Seasonal Allergies, Sinus Pain, Sore Throat, Visual Disturbances and Yellow Eyes. Breast Present- Breast Mass. Not Present- Breast Pain, Nipple Discharge and Skin Changes. Cardiovascular Not Present- Chest Pain, Difficulty Breathing Lying Down, Leg Cramps, Palpitations, Rapid Heart Rate, Shortness of Breath and Swelling of Extremities. Gastrointestinal Not Present- Abdominal  Pain, Bloating, Bloody Stool, Change in Bowel Habits, Chronic diarrhea, Constipation, Difficulty Swallowing, Excessive gas, Gets full quickly at meals, Hemorrhoids, Indigestion, Nausea, Rectal Pain and Vomiting. Female Genitourinary Not Present- Frequency, Nocturia, Painful Urination, Pelvic Pain and Urgency. Musculoskeletal Not Present- Back Pain, Joint Pain, Joint Stiffness, Muscle Pain, Muscle Weakness and Swelling of Extremities. Neurological Not Present- Decreased Memory, Fainting, Headaches, Numbness, Seizures, Tingling, Tremor, Trouble walking and Weakness. Psychiatric Not Present- Anxiety, Bipolar, Change in Sleep Pattern, Depression, Fearful and Frequent crying. Endocrine Not Present- Cold Intolerance, Excessive Hunger, Hair Changes, Heat Intolerance, Hot flashes and New Diabetes. Hematology Not Present- Easy Bruising, Excessive bleeding, Gland problems, HIV and Persistent Infections.  Vitals [employer] R. Brooks CMA; 11/20/2015 2:31 PM) 11/20/2015 2:30 PM Weight: 270.13 lb Height: 64in Body Surface Area: 2.22 m Body Mass Index: 46.37 kg/m  BP: 152/98 (Sitting, Left Arm, Standard)      Physical Exam (Zarion Oliff A. Mathan Darroch MD; 11/20/2015 3:42 PM)  General Mental Status-Alert. General Appearance-Consistent with stated age. Hydration-Well hydrated. Voice-Normal.  Head and Neck Head-normocephalic, atraumatic with no lesions or palpable masses. Trachea-midline. Thyroid Gland Characteristics - normal size and consistency.  Eye Eyeball - Bilateral-Extraocular movements intact. Sclera/Conjunctiva - Bilateral-No scleral icterus.  Chest and Lung Exam Chest and lung exam reveals -quiet, even and easy respiratory effort with no use of accessory muscles and on auscultation, normal breath sounds, no adventitious sounds and normal vocal resonance. Inspection Chest Wall - Normal. Back - normal.  Breast Breast - Left-Symmetric, Non Tender, No Biopsy scars, no  Dimpling, No Inflammation, No Lumpectomy scars, No Mastectomy scars, No Peau d' Orange. Breast - Right-Symmetric, Non Tender, No Biopsy scars, no Dimpling, No Inflammation, No Lumpectomy scars, No Mastectomy scars, No Peau d' Orange. Breast Lump-No Palpable Breast Mass.  Cardiovascular Cardiovascular examination reveals -normal heart sounds, regular rate and rhythm with no murmurs and normal pedal pulses bilaterally.  Abdomen Inspection Inspection of the abdomen reveals - No Hernias.  Skin - Scar - no surgical scars. Palpation/Percussion Palpation and Percussion of the abdomen reveal - Soft, Non Tender, No Rebound tenderness, No Rigidity (guarding) and No hepatosplenomegaly. Auscultation Auscultation of the abdomen reveals - Bowel sounds normal.  Neurologic Neurologic evaluation reveals -alert and oriented x 3 with no impairment of recent or remote memory. Mental Status-Normal.  Musculoskeletal Normal Exam - Left-Upper Extremity Strength Normal and Lower Extremity Strength Normal. Normal Exam - Right-Upper Extremity Strength Normal and Lower Extremity Strength Normal.  Lymphatic Head & Neck  General Head & Neck Lymphatics: Bilateral - Description - Normal. Axillary  General Axillary Region: Bilateral - Description - Normal. Tenderness - Non Tender. Femoral & Inguinal  Generalized Femoral & Inguinal Lymphatics: Bilateral - Description - Normal. Tenderness - Non Tender.    Assessment & Plan (Buddy Loeffelholz A. Danasha Melman MD; 11/20/2015 3:43 PM)  BREAST CANCER, RIGHT (C50.911) Impression: Discussed breast conservation with right breast lumpectomy and sentinel lymph node mapping. Discussed mastectomy reconstruction. Pros and cons of each were reviewed. She has opted for breast conservation surgery with right breast seed Risk of lumpectomy include bleeding, infection, seroma, more surgery, use of seed/wire, wound care, cosmetic deformity and the need for other treatments, death ,  blood clots, death. Pt agrees to proceed. Risk of sentinel lymph node mapping include bleeding, infection, lymphedema, shoulder pain. stiffness, dye allergy. cosmetic deformity , blood clots, death, need for more surgery. Pt agres to proceed. localized lumpectomy and right axillary cyst lymph node mapping.  Current Plans Referred to Genetic Counseling, for evaluation and follow up (Medical Genetics). Routine. Referred to Oncology, for evaluation and follow up (Oncology). Routine. Referred to Radiation Oncology, for evaluation and follow up (Radiation Oncology). Routine. Referred to Physical Therapy, for evaluation and follow up (Physical Therapy). Routine. We discussed the staging and pathophysiology of breast cancer. We discussed all of the different options for treatment for breast cancer including surgery, chemotherapy, radiation therapy, Herceptin, and antiestrogen therapy. We discussed a sentinel lymph node biopsy as she does not appear to having lymph node involvement right now. We discussed the performance of that with injection of radioactive tracer and blue dye. We discussed that she would have an incision underneath her axillary hairline. We discussed that there is a bout a 10-20% chance of having a positive node with a sentinel lymph node biopsy and we will await the permanent pathology to make any other first further decisions in terms of her treatment. One of these options might be to return to the operating room to perform an axillary lymph node dissection. We discussed about a 1-2% risk lifetime of chronic shoulder pain as well as lymphedema associated with a sentinel lymph node biopsy. We discussed the options for treatment of the breast cancer which included lumpectomy versus a mastectomy. We discussed the performance of the lumpectomy with a wire placement. We discussed a 10-20% chance of a positive margin requiring reexcision in the operating room. We also discussed that she may need  radiation therapy or antiestrogen therapy or both if she undergoes lumpectomy. We discussed the mastectomy and the postoperative care for that as well. We discussed that there is no difference in her survival whether she undergoes lumpectomy with radiation therapy or antiestrogen therapy versus a mastectomy. There is a slight difference in the local recurrence rate being 3-5% with lumpectomy and about 1% with a mastectomy. We discussed the risks of operation including bleeding, infection, possible reoperation. She understands her further therapy will be based on what her stages at the  time of her operation.  Pt Education - [facility] Breast Cancer Information Given - [facility] "Breast Journey" Package You are being scheduled for surgery - Our schedulers will call you.  You should hear from our office's scheduling department within 5 working days about the location, date, and time of surgery. We try to make accommodations for patient's preferences in scheduling surgery, but sometimes the OR schedule or the surgeon's schedule prevents [place] from making those accommodations.  If you have not heard from our office [phone] in 5 working days, call the office and ask for your surgeon's nurse.  If you have other questions about your diagnosis, plan, or surgery, call the office and ask for your surgeon's nurse.  Pt Education - [facility] Breast Biopsy HCI: discussed with patient and provided information. Pt Education - ABC (After Breast Cancer) Class Info: discussed with patient and provided information.

## 2015-11-21 ENCOUNTER — Other Ambulatory Visit: Payer: Self-pay

## 2015-11-21 ENCOUNTER — Telehealth: Payer: Self-pay | Admitting: Oncology

## 2015-11-21 DIAGNOSIS — C50919 Malignant neoplasm of unspecified site of unspecified female breast: Secondary | ICD-10-CM

## 2015-11-21 NOTE — Telephone Encounter (Signed)
new breast appt-s/w patient and gave np appt for 12/14 @ 4 w/Dr. Doris Cheadle.  Referring Dr. Brantley Stage

## 2015-11-22 ENCOUNTER — Other Ambulatory Visit (HOSPITAL_BASED_OUTPATIENT_CLINIC_OR_DEPARTMENT_OTHER): Payer: BLUE CROSS/BLUE SHIELD

## 2015-11-22 ENCOUNTER — Ambulatory Visit (HOSPITAL_BASED_OUTPATIENT_CLINIC_OR_DEPARTMENT_OTHER): Payer: BLUE CROSS/BLUE SHIELD | Admitting: Oncology

## 2015-11-22 VITALS — BP 149/84 | HR 98 | Temp 98.0°F | Resp 18 | Ht 64.0 in | Wt 272.2 lb

## 2015-11-22 DIAGNOSIS — C50411 Malignant neoplasm of upper-outer quadrant of right female breast: Secondary | ICD-10-CM | POA: Diagnosis not present

## 2015-11-22 DIAGNOSIS — Z170 Estrogen receptor positive status [ER+]: Secondary | ICD-10-CM

## 2015-11-22 DIAGNOSIS — C50919 Malignant neoplasm of unspecified site of unspecified female breast: Secondary | ICD-10-CM

## 2015-11-22 LAB — COMPREHENSIVE METABOLIC PANEL
ALBUMIN: 3.6 g/dL (ref 3.5–5.0)
ALK PHOS: 130 U/L (ref 40–150)
ALT: 13 U/L (ref 0–55)
AST: 16 U/L (ref 5–34)
Anion Gap: 10 mEq/L (ref 3–11)
BILIRUBIN TOTAL: 0.37 mg/dL (ref 0.20–1.20)
BUN: 13.4 mg/dL (ref 7.0–26.0)
CO2: 25 meq/L (ref 22–29)
Calcium: 9.3 mg/dL (ref 8.4–10.4)
Chloride: 106 mEq/L (ref 98–109)
Creatinine: 0.8 mg/dL (ref 0.6–1.1)
EGFR: >90 mL/min/{1.73_m2} (ref >90)
GLUCOSE: 91 mg/dL (ref 70–140)
POTASSIUM: 3.7 meq/L (ref 3.5–5.1)
SODIUM: 141 meq/L (ref 136–145)
TOTAL PROTEIN: 7.9 g/dL (ref 6.4–8.3)

## 2015-11-22 LAB — CBC WITH DIFFERENTIAL/PLATELET
BASO%: 0.4 % (ref 0.0–2.0)
Basophils Absolute: 0 10*3/uL (ref 0.0–0.1)
EOS%: 3.6 % (ref 0.0–7.0)
Eosinophils Absolute: 0.2 10*3/uL (ref 0.0–0.5)
HCT: 41.7 % (ref 34.8–46.6)
HEMOGLOBIN: 13.6 g/dL (ref 11.6–15.9)
LYMPH%: 54.4 % — AB (ref 14.0–49.7)
MCH: 27.9 pg (ref 25.1–34.0)
MCHC: 32.6 g/dL (ref 31.5–36.0)
MCV: 85.5 fL (ref 79.5–101.0)
MONO#: 0.3 10*3/uL (ref 0.1–0.9)
MONO%: 7.1 % (ref 0.0–14.0)
NEUT%: 34.5 % — ABNORMAL LOW (ref 38.4–76.8)
NEUTROS ABS: 1.7 10*3/uL (ref 1.5–6.5)
Platelets: 270 10*3/uL (ref 145–400)
RBC: 4.88 10*6/uL (ref 3.70–5.45)
RDW: 13.7 % (ref 11.2–14.5)
WBC: 4.8 10*3/uL (ref 3.9–10.3)
lymph#: 2.6 10*3/uL (ref 0.9–3.3)

## 2015-11-22 NOTE — Progress Notes (Signed)
[facility]  [phone] [phone]     ID: MEGIN CONSALVO DOB: March 09, 1961  MR#: [id number]  GIG#:[id number]  Patient Care Team: Clayborn Heron, MD as PCP - General (Family Medicine) Harriette Bouillon, MD as Consulting Physician (General Surgery) Lowella Dell, MD as Consulting Physician (Oncology) Levert Feinstein, MD as Consulting Physician (Neurology) Carrington Clamp, DPM as Consulting Physician (Podiatry) Maxie Better, MD as Consulting Physician (Obstetrics and Gynecology) Lonie Peak, MD as Attending Physician (Radiation Oncology) PCP: Clayborn Heron, MD OTHER MD:  CHIEF COMPLAINT: Triple positive breast cancer  CURRENT TREATMENT: Awaiting definitive surgery   BREAST CANCER HISTORY: Salle had screening mammography at Dr. cousins office on 11/06/2015 suggesting a change in the right breast. She was scheduled for right diagnostic mammography and tomosynthesis with ultrasonography at the breast Center 11/13/2015. The breast density was category B. In the upper outer quadrant of the right breast there was a small irregular mass with suspicious internal calcifications. By exam this was small, mobile, firm, and located at the 10:00 position 7 cm from the nipple. Ultrasound of the right breast confirmed an irregularly marginated hypoechoic mass measuring 1.1 cm. The right axilla was sonographically benign.  Biopsy of the right breast mass in question 11/15/2015 showed ([id number]) an invasive ductal carcinoma, grade 2, estrogen receptor 100% positive, progesterone receptor 2% positive, both with strong staining intensity, with an MIB-1 of 70%, and HER-2 amplification, the signals ratio being 4.92 and the number per cell 17.45.  The patient's subsequent history is as detailed below.  INTERVAL HISTORY: Amirah was evaluated in the breast clinic 11/22/2015 accompanied by her friend Jerrye Bushy. Her case was also presented in the  multidisciplinary breast cancer conference that same morning. At that time a preliminary plan was proposed: Consideration of neoadjuvant chemotherapy while the patient undergoes genetics testing, versus proceeding with breast conserving surgery and sentinel lymph node sampling with adjuvant therapy to follow.  REVIEW OF SYSTEMS: There were no specific symptoms leading to the original mammogram, which was routinely scheduled. The patient denies unusual headaches, visual changes, nausea, vomiting, stiff neck, dizziness, or gait imbalance. There has been no cough, phlegm production, or pleurisy, no chest pain or pressure, and no change in bowel or bladder habits. The patient denies fever, rash, bleeding, unexplained fatigue or unexplained weight loss. She status post left knee laparoscopic therapy and this still causes her problems at times. Her trigeminal neuralgia is largely resolved. She exercises chiefly by [facility] perhaps [facility]  A detailed review of systems was otherwise entirely negative.  PAST MEDICAL HISTORY: Past Medical History  Diagnosis Date  . Allergy   . Arthritis   . Anemia   . Hypertension   . Trigeminal neuralgia 05/2014    PAST SURGICAL HISTORY: Past Surgical History  Procedure Laterality Date  . Breast surgery      biopsy  . Abdominal hysterectomy    . Meniscus repair      L knee  . Hysterotomy    . Eye surgery      FAMILY HISTORY Family History  Problem Relation Age of Onset  . Diabetes Mother   . Hypertension Mother   . Asthma Daughter   . Diabetes Sister   . Hypertension Sister    the patient's father died from lung cancer at the age of 67, in the setting of tobacco abuse. The patient's mother is living, at age 69. The patient had 2 brothers, 3 sisters. One  sister was diagnosed with colon cancer in her 61s and later thyroid cancer. She died from metastatic disease. One brother died with pulmonary problems. On the maternal side a cousin was  diagnosed with inflammatory breast cancer at the age of 86. A second cousin on the maternal side was diagnosed with breast cancer in her early 55s. There is no history of ovarian cancer in the family.  GYNECOLOGIC HISTORY:  No LMP recorded. Patient is postmenopausal. Menarche age 32, first live birth age 65, the patient is [place] P2. She underwent simple hysterectomy with bilateral salpingectomy 06/28/2010, with benign pathology([id number]). She still has her ovaries in place. 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. She describes herself is single. At home she lives with her daughter Money Mckeithan, who teaches theater at [employer] and is currently studying towards a Masters in counseling; and her son [name], who is a professional basketball player in the international [employer] (currently with the [employer]). The patient has no grandchildren. She attends a Tour manager    ADVANCED DIRECTIVES: Not in place. At the initial clinic visit 11/22/2015 the patient was given the appropriate forms to complete and notarize at her discretion.  HEALTH MAINTENANCE: Social History  Substance Use Topics  . Smoking status: Never Smoker   . Smokeless tobacco: Never Used  . Alcohol Use: No     Colonoscopy: 2016/[facility]  PAP:  Bone density:  Lipid panel:  Allergies  Allergen Reactions  . Penicillins Anaphylaxis  . Codeine Rash  . Darvocet [Propoxyphene N-Acetaminophen] Rash  . Demerol Rash  . Erythromycin Rash  . Flexeril [Cyclobenzaprine Hcl] Rash  . Percocet [Oxycodone-Acetaminophen] Itching and Rash    Red streaks come up on skin  . Sulfa Antibiotics Rash    Current Outpatient Prescriptions  Medication Sig Dispense Refill  . diclofenac (VOLTAREN) 75 MG EC tablet Take 75 mg by mouth 2 (two) times daily as needed.     Marland Kitchen DICLOFENAC PO Take by mouth as  needed.    . gabapentin (NEURONTIN) 600 MG tablet Take 1 tablet (600 mg total) by mouth 3 (three) times daily. 90 tablet 6   No current facility-administered medications for this visit.    OBJECTIVE: Middle-aged African-American woman who appears well Filed Vitals:   11/22/15 1623  BP: 149/84  Pulse: 98  Temp: 98 F (36.7 C)  Resp: 18     Body mass index is 46.7 kg/(m^2).    ECOG FS:0 - Asymptomatic  Ocular: Sclerae unicteric, pupils equal, round and reactive to light Ear-nose-throat: Oropharynx clear and moist Lymphatic: No cervical or supraclavicular adenopathy Lungs no rales or rhonchi, good excursion bilaterally Heart regular rate and rhythm, no murmur appreciated Abd soft, obese, nontender, positive bowel sounds MSK no focal spinal tenderness, no joint edema Neuro: non-focal, well-oriented, appropriate affect Breasts: The right breast is status post recent biopsy. There is a minimal ecchymosis. I do not palpate a well-defined mass. There are no skin or nipple changes of concern. The right axilla is benign per the left breast is unremarkable.   LAB RESULTS:  CMP     Component Value Date/Time   NA 141 11/22/2015 1548   NA 143 04/25/2015 1148   NA 139 05/25/2014 1112   K 3.7 11/22/2015 1548   K 4.2 04/25/2015 1148   CL 103 04/25/2015 1148   CO2 25 11/22/2015 1548   CO2 27  04/25/2015 1148   GLUCOSE 91 11/22/2015 1548   GLUCOSE 89 04/25/2015 1148   GLUCOSE 86 05/25/2014 1112   BUN 13.4 11/22/2015 1548   BUN 13 04/25/2015 1148   BUN 14 05/25/2014 1112   CREATININE 0.8 11/22/2015 1548   CREATININE 0.71 04/25/2015 1148   CREATININE 0.72 05/25/2014 1112   CALCIUM 9.3 11/22/2015 1548   CALCIUM 9.2 04/25/2015 1148   PROT 7.9 11/22/2015 1548   ALBUMIN 3.6 11/22/2015 1548   AST 16 11/22/2015 1548   ALT 13 11/22/2015 1548   ALKPHOS 130 11/22/2015 1548   BILITOT 0.37 11/22/2015 1548   GFRNONAA 98 04/25/2015 1148   GFRAA 112 04/25/2015 1148    INo results found for:  SPEP, UPEP  Lab Results  Component Value Date   WBC 4.8 11/22/2015   NEUTROABS 1.7 11/22/2015   HGB 13.6 11/22/2015   HCT 41.7 11/22/2015   MCV 85.5 11/22/2015   PLT 270 11/22/2015      Chemistry      Component Value Date/Time   NA 141 11/22/2015 1548   NA 143 04/25/2015 1148   NA 139 05/25/2014 1112   K 3.7 11/22/2015 1548   K 4.2 04/25/2015 1148   CL 103 04/25/2015 1148   CO2 25 11/22/2015 1548   CO2 27 04/25/2015 1148   BUN 13.4 11/22/2015 1548   BUN 13 04/25/2015 1148   BUN 14 05/25/2014 1112   CREATININE 0.8 11/22/2015 1548   CREATININE 0.71 04/25/2015 1148   CREATININE 0.72 05/25/2014 1112      Component Value Date/Time   CALCIUM 9.3 11/22/2015 1548   CALCIUM 9.2 04/25/2015 1148   ALKPHOS 130 11/22/2015 1548   AST 16 11/22/2015 1548   ALT 13 11/22/2015 1548   BILITOT 0.37 11/22/2015 1548       No results found for: LABCA2  No components found for: LABCA125  No results for input(s): INR in the last 168 hours.  Urinalysis No results found for: COLORURINE, APPEARANCEUR, LABSPEC, PHURINE, GLUCOSEU, HGBUR, BILIRUBINUR, KETONESUR, PROTEINUR, UROBILINOGEN, NITRITE, LEUKOCYTESUR  STUDIES: Mm Digital Diagnostic Unilat R  11/15/2015  CLINICAL DATA:  Post right breast ultrasound-guided biopsy. EXAM: DIAGNOSTIC RIGHT MAMMOGRAM POST ULTRASOUND BIOPSY COMPARISON:  Previous exam(s). FINDINGS: Mammographic images were obtained following ultrasound guided biopsy of the mass located within the right breast at the 10 o'clock position. The ribbon shaped clip is in appropriate position. IMPRESSION: Appropriate position of clip following right breast ultrasound-guided biopsy. Final Assessment: Post Procedure Mammograms for Marker Placement Electronically Signed   By: Altamese Cabal M.D.   On: 11/15/2015 16:11   Mm Radiologist Eval And Mgmt  11/20/2015  EXAM: ESTABLISHED PATIENT OFFICE VISIT - LEVEL II CHIEF COMPLAINT: Patient returns for results following right breast core  biopsy. Current Pain Level: 2 HISTORY OF PRESENT ILLNESS: Patient returns for results following right breast core biopsy. EXAM: The biopsy site is clean and dry without hematoma formation. PATHOLOGY: Invasive ductal carcinoma and DCIS.  The pathology is concordant. ASSESSMENT AND PLAN: ASSESSMENT AND PLAN The pathology is concordant with the imaging findings. The patient is referred to Dr. Erroll Luna of [facility] surgery for consultation on 11/20/2015. Post biopsy wound care instructions were reviewed with the patient. The patient was encouraged to call our breast center for additional questions or concerns. Educational materials were discussed with the patient. Electronically Signed   By: Altamese Cabal M.D.   On: 11/20/2015 14:55   US Breast Ltd Uni Right Inc Axilla  11/13/2015  CLINICAL DATA:  Recall from screening mammogram. EXAM: DIGITAL DIAGNOSTIC RIGHT MAMMOGRAM WITH CAD ULTRASOUND RIGHT BREAST COMPARISON:  Previous exam(s). ACR Breast Density Category b: There are scattered areas of fibroglandular density. FINDINGS: There is a small irregular mass present locating within the upper-outer quadrant of the right breast at the 10 o'clock position approximately 7 cm from the nipple. This does contain suspicious internal calcifications. Mammographic images were processed with CAD. On physical exam, there is a small, mobile, firm mass present located within the right breast at 10 o'clock position 7 cm from the nipple. There is no palpable right axillary adenopathy. Targeted ultrasound is performed, showing an irregularly marginated hypoechoic mass located within the right breast at the 10 o'clock position 7 cm from the nipple corresponding to the mammographic and palpable finding. This measures 11 x 10 x 7 mm in size and and is suspicious for invasive mammary carcinoma. Tissue sampling via ultrasound-guided core biopsy is recommended and will be scheduled. Ultrasound of the right axilla demonstrates  normal axillary contents and no evidence for adenopathy. IMPRESSION: 1.1 cm irregular mass located within the right breast at 10 o'clock position 7 cm from the nipple. Tissue sampling via ultrasound-guided core biopsy is recommended and will be scheduled. RECOMMENDATION: Right breast ultrasound-guided core biopsy. I have discussed the findings and recommendations with the patient. Results were also provided in writing at the conclusion of the visit. If applicable, a reminder letter will be sent to the patient regarding the next appointment. BI-RADS CATEGORY  4: Suspicious. Electronically Signed   By: Altamese Cabal M.D.   On: 11/13/2015 11:08   Mm Diag Breast Tomo Uni Right  11/13/2015  CLINICAL DATA:  Recall from screening mammogram. EXAM: DIGITAL DIAGNOSTIC RIGHT MAMMOGRAM WITH CAD ULTRASOUND RIGHT BREAST COMPARISON:  Previous exam(s). ACR Breast Density Category b: There are scattered areas of fibroglandular density. FINDINGS: There is a small irregular mass present locating within the upper-outer quadrant of the right breast at the 10 o'clock position approximately 7 cm from the nipple. This does contain suspicious internal calcifications. Mammographic images were processed with CAD. On physical exam, there is a small, mobile, firm mass present located within the right breast at 10 o'clock position 7 cm from the nipple. There is no palpable right axillary adenopathy. Targeted ultrasound is performed, showing an irregularly marginated hypoechoic mass located within the right breast at the 10 o'clock position 7 cm from the nipple corresponding to the mammographic and palpable finding. This measures 11 x 10 x 7 mm in size and and is suspicious for invasive mammary carcinoma. Tissue sampling via ultrasound-guided core biopsy is recommended and will be scheduled. Ultrasound of the right axilla demonstrates normal axillary contents and no evidence for adenopathy. IMPRESSION: 1.1 cm irregular mass located within  the right breast at 10 o'clock position 7 cm from the nipple. Tissue sampling via ultrasound-guided core biopsy is recommended and will be scheduled. RECOMMENDATION: Right breast ultrasound-guided core biopsy. I have discussed the findings and recommendations with the patient. Results were also provided in writing at the conclusion of the visit. If applicable, a reminder letter will be sent to the patient regarding the next appointment. BI-RADS CATEGORY  4: Suspicious. Electronically Signed   By: Altamese Cabal M.D.   On: 11/13/2015 11:08   [place] Rt Breast Bx W Loc Dev 1st Lesion Img Bx Spec US Guide  11/16/2015  ADDENDUM REPORT: 11/16/2015 15:07 ADDENDUM: Pathology reveals Grade II INVASIVE DUCTAL CARCINOMA, DUCTAL CARCINOMA IN SITU, ASSOCIATED CALCIFICATION of the Right breast at  the 10 o'clock location. This was found to be concordant by Dr. Franki Cabot. Pathology results were discussed with the patient in person. Educational materials were discussed and provided for the patient. She reported no problems with the biopsy site and is doing well. Post biopsy care and instructions were reviewed and questions were answered. She was encouraged to contact [facility] with any additional questions and or concerns. A surgical referral was arranged with Dr. Erroll [facility] of [facility] on November 20, 2015. Pathology results reported by Terie Purser RN on November 16, 2015. Electronically Signed   By: Altamese Cabal M.D.   On: 11/16/2015 15:07  11/16/2015  CLINICAL DATA:  Right breast mass EXAM: ULTRASOUND GUIDED RIGHT BREAST CORE NEEDLE BIOPSY COMPARISON:  Previous exam(s). FINDINGS: I met with the patient and we discussed the procedure of ultrasound-guided biopsy, including benefits and alternatives. We discussed the high likelihood of a successful procedure. We discussed the risks of the procedure, including infection, bleeding, tissue injury, clip migration, and  inadequate sampling. Informed written consent was given. The usual time-out protocol was performed immediately prior to the procedure. Using sterile technique and 1% Lidocaine as local anesthetic, under direct ultrasound visualization, a 14 gauge spring-loaded device was used to perform biopsy of the mass located within the right breast at the 10 o'clock position using a lateral/inferior approach. At the conclusion of the procedure a ribbon shaped tissue marker clip was deployed into the biopsy cavity. Follow up 2 view mammogram was performed and dictated separately. IMPRESSION: Ultrasound guided biopsy of the right breast mass located at the 2 o'clock position as discussed above. No apparent complications. Electronically Signed: By: Altamese Cabal M.D. On: 11/15/2015 15:50    ASSESSMENT: 54 y.o. [place] woman status post right breast upper outer quadrant biopsy 11/15/2015 for a clinical T1c N0, stage IA invasive ductal carcinoma, grade 2, estrogen and progesterone receptor positive, HER-2 amplified, with an MIB-1 of 70%  (1) Genetics testing pending  (2) right lumpectomy with sentinel lymph node sampling  (3) adjuvant chemotherapy with Abraxane weekly 12 to be given together with trastuzumab every 3 weeks  (4) trastuzumab to be continued to total one year  (5) adjuvant radiation to follow chemotherapy  (6) anti-estrogens to follow radiation  PLAN: We spent the better part of today's hour-long appointment discussing the biology of breast cancer in general, and the specifics of the patient's tumor in particular. We discussed the difference between local and systemic therapy for breast cancer and she understands that she will need surgery, but that surgical options range from lumpectomy with sentinel lymph node sampling to bilateral mastectomies.  One important consideration here is the patient's family history. This does suggest there may be an identifiable deleterious mutation. The patient  is agreeable to genetic testing, which may take anywhere from 2-4 weeks to get results. If the patient were to choose bilateral mastectomies in case of, stage, a BRCA mutation, then it might make sense to start with chemotherapy while the test results are pending. If, on the other hand, Becca would prefer to to undergo lumpectomy and sentinel lymph node sampling even if she knew she carried a deleterious mutation, then she would proceed to surgery first.  She is very clear that she would not choose bilateral mastectomies even if she were BRCA positive. She understands there is no survival advantage to bilateral mastectomies in that setting as compared to intensified screening with yearly mammography and yearly MRIs. Accordingly she will  start with surgery. She understands she will need adjuvant radiation at some point to complete her local treatment.  We then discussed systemic therapy. She qualifies for all 3 modalities, namely chemotherapy, anti-HER-2 immunotherapy, and antiestrogen pills. We have good data that in patients like her, with small node negative estrogen receptor positive tumors, a taxanes alone may be sufficient chemotherapy. Accordingly I proposed weekly Abraxane 12, which she should tolerate well. We did discuss the possible toxicities, side effects and complications of this agent. This would be given together with trastuzumab and she understands the risks of weakening of the heart muscle and immune reactions as well as other possible complications of that agent.  She will benefit from a port. She will also need an echocardiogram. She will also be scheduled to meet with our chemotherapy teaching nurse.  I have made her a return appointment with me for the second-half of January, by which time we should have both the genetics results and the final pathology results to discuss. She should be able to start her chemotherapy shortly thereafter.  Cadience has a good understanding of the overall  plan. She agrees with it. She knows the goal of treatment in her case is cure. She will call with any problems that may develop before her next visit here.  Chauncey Cruel, MD   11/22/2015 6:01 PM Medical Oncology and Hematology [facility] [address] [place], [place] [address] Tel. [phone]    Fax. [phone]

## 2015-11-23 ENCOUNTER — Telehealth: Payer: Self-pay | Admitting: Oncology

## 2015-11-23 ENCOUNTER — Ambulatory Visit: Payer: Self-pay | Admitting: Surgery

## 2015-11-23 DIAGNOSIS — C50911 Malignant neoplasm of unspecified site of right female breast: Secondary | ICD-10-CM

## 2015-11-23 NOTE — Telephone Encounter (Signed)
Aware of her follow up appointment and aware that i will call with echo after precert is obtained

## 2015-11-24 ENCOUNTER — Encounter: Payer: Self-pay | Admitting: Radiation Oncology

## 2015-11-24 ENCOUNTER — Other Ambulatory Visit: Payer: Self-pay | Admitting: Radiation Oncology

## 2015-11-24 ENCOUNTER — Ambulatory Visit
Admission: RE | Admit: 2015-11-24 | Discharge: 2015-11-24 | Disposition: A | Discharge status: Home / Self Care | Payer: BLUE CROSS/BLUE SHIELD | Source: Ambulatory Visit | Attending: Radiation Oncology | Admitting: Radiation Oncology

## 2015-11-24 ENCOUNTER — Telehealth: Payer: Self-pay | Admitting: *Deleted

## 2015-11-24 VITALS — BP 147/87 | HR 93 | Temp 98.1°F | Ht 64.0 in | Wt 274.1 lb

## 2015-11-24 DIAGNOSIS — C50411 Malignant neoplasm of upper-outer quadrant of right female breast: Secondary | ICD-10-CM | POA: Diagnosis not present

## 2015-11-24 NOTE — Progress Notes (Signed)
Radiation Oncology         [phone] ________________________________  Initial outpatient Consultation  Name: Vanessa [name] MRN: [id number]  Date: 11/24/2015  DOB: [birth year]  QJ:JHERDEYC R Rankins, MD  Rankins, Bill Salinas, MD   REFERRING PHYSICIAN: Rankins, Bill Salinas, MD  DIAGNOSIS:    ICD-9-CM ICD-10-CM   1. 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 female who presented with an irregular right breast mass at the 10 o'clock position with suspicious internal calcifications on screening mammogram on 11/13/15. On ultrasound, the mass was 11 mm in greatest dimension; ultrasound to the right axilla was negative. Biopsy on 11/15/15 showed invasive ductal carcinoma with DCIS with characteristics as described above in the diagnosis. she is in her USOH. Did not palpate a mass.   The patient saw Dr. Brantley Stage on 11/20/15 and anticipates lumpectomy and axillary lymph node dissection. This surgery is scheduled for 12/27/15. She saw Dr. Jana Hakim on 11/22/15 and has plans for adjuvant weekly abraxane x12. There is also plan for adjuvant trastuzumab.  She reports an occasional "twinge" at her biopsy site, but otherwise states she has no pain. She has no other complaints at this time. She has never had a cancer diagnosis previously. She has a maternal cousin 2 year old who died of inflammatory breast cancer. Another maternal cousin was recently diagnosed with breast cancer. Her deceased father had lung cancer. Deceased sister had a history of colon cancer and thyroid cancer. She has one daughter and one son. She takes gabapentin due to previous trigeminal neuralgia. She does not take it consistently and most recently took it this past weekend. She did not receive  surgery or radiation for the trigeminal neuralgia.  Genetic counseling is pending, per patient, due to family history.   PREVIOUS RADIATION THERAPY: No  PAST MEDICAL HISTORY:  has a past medical history of Allergy; Arthritis; Anemia; Hypertension; and Trigeminal neuralgia (05/2014).    PAST SURGICAL HISTORY: Past Surgical History  Procedure Laterality Date  . Breast surgery      biopsy  . Abdominal hysterectomy    . Meniscus repair      L knee  . Hysterotomy    . Eye surgery      FAMILY HISTORY: family history includes Asthma in her daughter; Diabetes in her mother and sister; Hypertension in her mother and sister.  SOCIAL HISTORY:  reports that she has never smoked. She has never used smokeless tobacco. She reports that she does not drink alcohol or use illicit drugs.  ALLERGIES: Penicillins; Codeine; Darvocet; Demerol; Erythromycin; Flexeril; Percocet; and Sulfa antibiotics  MEDICATIONS:  Current Outpatient Prescriptions  Medication Sig Dispense Refill  . acetaminophen (TYLENOL) 325 MG tablet Take 650 mg by mouth every 6 (six) hours as needed.    . diclofenac (VOLTAREN) 75 MG EC tablet Take 75 mg by mouth 2 (two) times daily as needed.     Marland Kitchen DICLOFENAC PO Take by mouth as needed.    . gabapentin (NEURONTIN) 600 MG tablet Take 1 tablet (600 mg total) by mouth 3 (three) times daily. 90 tablet 6  . ibuprofen (ADVIL,MOTRIN) 200 MG tablet Take 200 mg by mouth every 6 (six) hours as needed.     No current facility-administered medications for this encounter.  REVIEW OF SYSTEMS:  Notable for that above.   PHYSICAL EXAM:  height is '5\' 4"'$  (1.626 m) and weight is 274 lb 1.6 oz (124.331 kg). Her temperature is 98.1 F (36.7 C). Her blood pressure is 147/87 and her pulse is 93.   General: Alert and oriented, in no acute distress HEENT: Head is normocephalic. Extraocular movements are intact. Oropharynx is clear. Neck: Neck is supple, no palpable cervical or supraclavicular  lymphadenopathy. Heart: Regular in rate and rhythm with no murmurs, rubs, or gallops. Chest: Clear to auscultation bilaterally, with no rhonchi, wheezes, or rales. Abdomen: Soft, nontender, nondistended, with no rigidity or guarding. Extremities: No swelling in her upper extremities. Lymphatics: see Neck Exam Skin: No concerning lesions. Neurologic: Cranial nerves II through XII are grossly intact. No obvious focalities. Speech is fluent.  Psychiatric: Judgment and insight are intact. Affect is appropriate. Breasts: No palpable masses in the right breast or right axilla. No palpable masses in the left breast or left axilla. Small biopsy mark in the UOQ of the right breast, no bruising.    ECOG = 0  0 - Asymptomatic (Fully active, able to carry on all predisease activities without restriction)  1 - Symptomatic but completely ambulatory (Restricted in physically strenuous activity but ambulatory and able to carry out work of a light or sedentary nature. For example, light housework, office work)  2 - Symptomatic, <50% in bed during the day (Ambulatory and capable of all self care but unable to carry out any work activities. Up and about more than 50% of waking hours)  3 - Symptomatic, >50% in bed, but not bedbound (Capable of only limited self-care, confined to bed or chair 50% or more of waking hours)  4 - Bedbound (Completely disabled. Cannot carry on any self-care. Totally confined to bed or chair)  5 - Death   Eustace Pen MM, Creech RH, Tormey DC, et al. [phone]). "Toxicity and response criteria of the [facility] Group". [place] Oncol. 5 (6): 649-55   LABORATORY DATA:  Lab Results  Component Value Date   WBC 4.8 11/22/2015   HGB 13.6 11/22/2015   HCT 41.7 11/22/2015   MCV 85.5 11/22/2015   PLT 270 11/22/2015   CMP     Component Value Date/Time   NA 141 11/22/2015 1548   NA 143 04/25/2015 1148   NA 139 05/25/2014 1112   K 3.7 11/22/2015 1548   K 4.2  04/25/2015 1148   CL 103 04/25/2015 1148   CO2 25 11/22/2015 1548   CO2 27 04/25/2015 1148   GLUCOSE 91 11/22/2015 1548   GLUCOSE 89 04/25/2015 1148   GLUCOSE 86 05/25/2014 1112   BUN 13.4 11/22/2015 1548   BUN 13 04/25/2015 1148   BUN 14 05/25/2014 1112   CREATININE 0.8 11/22/2015 1548   CREATININE 0.71 04/25/2015 1148   CREATININE 0.72 05/25/2014 1112   CALCIUM 9.3 11/22/2015 1548   CALCIUM 9.2 04/25/2015 1148   PROT 7.9 11/22/2015 1548   ALBUMIN 3.6 11/22/2015 1548   AST 16 11/22/2015 1548   ALT 13 11/22/2015 1548   ALKPHOS 130 11/22/2015 1548   BILITOT 0.37 11/22/2015 1548   GFRNONAA 98 04/25/2015 1148   GFRAA 112 04/25/2015 1148         RADIOGRAPHY: Mm Digital Diagnostic Unilat R  11/15/2015  CLINICAL DATA:  Post right breast ultrasound-guided biopsy. EXAM: DIAGNOSTIC RIGHT MAMMOGRAM POST ULTRASOUND BIOPSY COMPARISON:  Previous exam(s). FINDINGS: Mammographic images were obtained following ultrasound guided biopsy of the  mass located within the right breast at the 10 o'clock position. The ribbon shaped clip is in appropriate position. IMPRESSION: Appropriate position of clip following right breast ultrasound-guided biopsy. Final Assessment: Post Procedure Mammograms for Marker Placement Electronically Signed   By: Altamese Cabal M.D.   On: 11/15/2015 16:11   Mm Radiologist Eval And Mgmt  11/20/2015  EXAM: ESTABLISHED PATIENT OFFICE VISIT - LEVEL II CHIEF COMPLAINT: Patient returns for results following right breast core biopsy. Current Pain Level: 2 HISTORY OF PRESENT ILLNESS: Patient returns for results following right breast core biopsy. EXAM: The biopsy site is clean and dry without hematoma formation. PATHOLOGY: Invasive ductal carcinoma and DCIS.  The pathology is concordant. ASSESSMENT AND PLAN: ASSESSMENT AND PLAN The pathology is concordant with the imaging findings. The patient is referred to Dr. Erroll Luna of [facility] surgery for consultation on  11/20/2015. Post biopsy wound care instructions were reviewed with the patient. The patient was encouraged to call our breast center for additional questions or concerns. Educational materials were discussed with the patient. Electronically Signed   By: Altamese Cabal M.D.   On: 11/20/2015 14:55   US Breast Ltd Uni Right Inc Axilla  11/13/2015  CLINICAL DATA:  Recall from screening mammogram. EXAM: DIGITAL DIAGNOSTIC RIGHT MAMMOGRAM WITH CAD ULTRASOUND RIGHT BREAST COMPARISON:  Previous exam(s). ACR Breast Density Category b: There are scattered areas of fibroglandular density. FINDINGS: There is a small irregular mass present locating within the upper-outer quadrant of the right breast at the 10 o'clock position approximately 7 cm from the nipple. This does contain suspicious internal calcifications. Mammographic images were processed with CAD. On physical exam, there is a small, mobile, firm mass present located within the right breast at 10 o'clock position 7 cm from the nipple. There is no palpable right axillary adenopathy. Targeted ultrasound is performed, showing an irregularly marginated hypoechoic mass located within the right breast at the 10 o'clock position 7 cm from the nipple corresponding to the mammographic and palpable finding. This measures 11 x 10 x 7 mm in size and and is suspicious for invasive mammary carcinoma. Tissue sampling via ultrasound-guided core biopsy is recommended and will be scheduled. Ultrasound of the right axilla demonstrates normal axillary contents and no evidence for adenopathy. IMPRESSION: 1.1 cm irregular mass located within the right breast at 10 o'clock position 7 cm from the nipple. Tissue sampling via ultrasound-guided core biopsy is recommended and will be scheduled. RECOMMENDATION: Right breast ultrasound-guided core biopsy. I have discussed the findings and recommendations with the patient. Results were also provided in writing at the conclusion of the visit.  If applicable, a reminder letter will be sent to the patient regarding the next appointment. BI-RADS CATEGORY  4: Suspicious. Electronically Signed   By: Altamese Cabal M.D.   On: 11/13/2015 11:08   Mm Diag Breast Tomo Uni Right  11/13/2015  CLINICAL DATA:  Recall from screening mammogram. EXAM: DIGITAL DIAGNOSTIC RIGHT MAMMOGRAM WITH CAD ULTRASOUND RIGHT BREAST COMPARISON:  Previous exam(s). ACR Breast Density Category b: There are scattered areas of fibroglandular density. FINDINGS: There is a small irregular mass present locating within the upper-outer quadrant of the right breast at the 10 o'clock position approximately 7 cm from the nipple. This does contain suspicious internal calcifications. Mammographic images were processed with CAD. On physical exam, there is a small, mobile, firm mass present located within the right breast at 10 o'clock position 7 cm from the nipple. There is no palpable right axillary adenopathy. Targeted ultrasound  is performed, showing an irregularly marginated hypoechoic mass located within the right breast at the 10 o'clock position 7 cm from the nipple corresponding to the mammographic and palpable finding. This measures 11 x 10 x 7 mm in size and and is suspicious for invasive mammary carcinoma. Tissue sampling via ultrasound-guided core biopsy is recommended and will be scheduled. Ultrasound of the right axilla demonstrates normal axillary contents and no evidence for adenopathy. IMPRESSION: 1.1 cm irregular mass located within the right breast at 10 o'clock position 7 cm from the nipple. Tissue sampling via ultrasound-guided core biopsy is recommended and will be scheduled. RECOMMENDATION: Right breast ultrasound-guided core biopsy. I have discussed the findings and recommendations with the patient. Results were also provided in writing at the conclusion of the visit. If applicable, a reminder letter will be sent to the patient regarding the next appointment. BI-RADS  CATEGORY  4: Suspicious. Electronically Signed   By: Altamese Cabal M.D.   On: 11/13/2015 11:08   [place] Rt Breast Bx W Loc Dev 1st Lesion Img Bx Spec US Guide  11/16/2015  ADDENDUM REPORT: 11/16/2015 15:07 ADDENDUM: Pathology reveals Grade II INVASIVE DUCTAL CARCINOMA, DUCTAL CARCINOMA IN SITU, ASSOCIATED CALCIFICATION of the Right breast at the 10 o'clock location. This was found to be concordant by Dr. Franki Cabot. Pathology results were discussed with the patient in person. Educational materials were discussed and provided for the patient. She reported no problems with the biopsy site and is doing well. Post biopsy care and instructions were reviewed and questions were answered. She was encouraged to contact [facility] with any additional questions and or concerns. A surgical referral was arranged with Dr. Erroll Luna of [facility] on November 20, 2015. Pathology results reported by Terie Purser RN on November 16, 2015. Electronically Signed   By: Altamese Cabal M.D.   On: 11/16/2015 15:07  11/16/2015  CLINICAL DATA:  Right breast mass EXAM: ULTRASOUND GUIDED RIGHT BREAST CORE NEEDLE BIOPSY COMPARISON:  Previous exam(s). FINDINGS: I met with the patient and we discussed the procedure of ultrasound-guided biopsy, including benefits and alternatives. We discussed the high likelihood of a successful procedure. We discussed the risks of the procedure, including infection, bleeding, tissue injury, clip migration, and inadequate sampling. Informed written consent was given. The usual time-out protocol was performed immediately prior to the procedure. Using sterile technique and 1% Lidocaine as local anesthetic, under direct ultrasound visualization, a 14 gauge spring-loaded device was used to perform biopsy of the mass located within the right breast at the 10 o'clock position using a lateral/inferior approach. At the conclusion of the procedure a ribbon shaped  tissue marker clip was deployed into the biopsy cavity. Follow up 2 view mammogram was performed and dictated separately. IMPRESSION: Ultrasound guided biopsy of the right breast mass located at the 2 o'clock position as discussed above. No apparent complications. Electronically Signed: By: Altamese Cabal M.D. On: 11/15/2015 15:50      IMPRESSION/PLAN: Stage I breast cancer, right. The consensus is that she would be a good candidate for breast conservation. I talked to her about the option of a mastectomy and informed her that her expected overall survival would be equivalent between mastectomy and breast conservation, based upon randomized controlled data. She is enthusiastic about breast conservation.  Vanessa [name] is a good candidate for radiation therapy in the management of her disease. It was a pleasure meeting the patient today. We discussed the risks, benefits, and side effects of  radiotherapy. We discussed that radiation would take approximately 7 weeks to complete and that I would give the patient a few weeks to heal following surgery before starting treatment planning. We spoke about acute effects including skin irritation and fatigue as well as much less common late effects including lung irritation. We spoke about the latest technology that is used to minimize the risk of late effects for breast cancer patients undergoing radiotherapy. No guarantees of treatment were given. The patient is enthusiastic about proceeding with treatment. I look forward to participating in the patient's care. The patient signed out a consent form today.  She has localized lumpectomy and SLN lymph node mapping scheduled for 12/27/15. Then systemic therapy to follow.  I'll wait for the patient to be referred back to me when she is ready for radiation planning.  Writing a note to genetics counselor to verify that consult is pending.     __________________________________________   Eppie Gibson, MD    This  document serves as a record of services personally performed by Eppie Gibson, MD. It was created on her behalf by Arlyce Harman, a trained medical scribe. The creation of this record is based on the scribe's personal observations and the provider's statements to them. This document has been checked and approved by the attending provider.

## 2015-11-24 NOTE — Telephone Encounter (Signed)
Left message for a return phone call to follow up after her new patient visit and discuss navigation resources.

## 2015-11-27 ENCOUNTER — Other Ambulatory Visit: Payer: Self-pay | Admitting: Surgery

## 2015-11-27 DIAGNOSIS — C50911 Malignant neoplasm of unspecified site of right female breast: Secondary | ICD-10-CM

## 2015-11-29 ENCOUNTER — Encounter: Payer: Self-pay | Admitting: *Deleted

## 2015-12-01 ENCOUNTER — Other Ambulatory Visit: Payer: Self-pay | Admitting: *Deleted

## 2015-12-01 ENCOUNTER — Encounter: Payer: Self-pay | Admitting: *Deleted

## 2015-12-01 ENCOUNTER — Ambulatory Visit
Admission: RE | Admit: 2015-12-01 | Discharge: 2015-12-01 | Disposition: A | Discharge status: Home / Self Care | Payer: BLUE CROSS/BLUE SHIELD | Source: Ambulatory Visit | Attending: Oncology | Admitting: Oncology

## 2015-12-01 ENCOUNTER — Other Ambulatory Visit: Payer: Self-pay | Admitting: Oncology

## 2015-12-01 DIAGNOSIS — N644 Mastodynia: Secondary | ICD-10-CM

## 2015-12-01 DIAGNOSIS — C50411 Malignant neoplasm of upper-outer quadrant of right female breast: Secondary | ICD-10-CM

## 2015-12-01 NOTE — Progress Notes (Signed)
Pt called c/o left palpable breast pain in UOQ. Pt recently dx with right breast cancer. Orders placed for left diagnostic mammo and [place]. Physician team notified.

## 2015-12-05 ENCOUNTER — Encounter: Payer: Self-pay | Admitting: *Deleted

## 2015-12-05 NOTE — Progress Notes (Signed)
[place] Psychosocial Distress Screening Clinical Social Work  Clinical Social Work was referred by distress screening protocol.  The patient scored a 10 on the Psychosocial Distress Thermometer which indicates severe distress. Clinical Social Worker reviewed chart and phoned pt to assess for distress and other psychosocial needs. CSW introduced self and role of CSW team. Pt very open to speaking with CSW. 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 CSW reviewed financial assistance organizations and pt plans to review to see if she meets income qualifications. CSW provided pt with contacts for financial advocate and RN navigator. Pt reports her anxiety has decreased, but she is eager to get treatment started and the waiting is hard. CSW validated emotions and discussed common emotions when experiencing cancer treatment. Pt very open to support programs and CSW to mail calendar. Pt plans to reach out as needed and was very appreciative of call.  ONCBCN DISTRESS SCREENING 11/24/2015  Screening Type Initial Screening  Distress experienced in past week (1-10) 10  Practical problem type Insurance  Emotional problem type Nervousness/Anxiety;Adjusting to illness  Physical Problem type Sleep/insomnia;Loss of appetitie    Clinical Social Worker follow up needed: Yes.    If yes, follow up plan: Mail calendar and resource information Loren Racer, [place]  [facility] Phone: [phone] Fax: [phone]

## 2015-12-10 DIAGNOSIS — Z853 Personal history of malignant neoplasm of breast: Secondary | ICD-10-CM

## 2015-12-10 DIAGNOSIS — C50919 Malignant neoplasm of unspecified site of unspecified female breast: Secondary | ICD-10-CM

## 2015-12-10 DIAGNOSIS — Z923 Personal history of irradiation: Secondary | ICD-10-CM

## 2015-12-10 DIAGNOSIS — Z9221 Personal history of antineoplastic chemotherapy: Secondary | ICD-10-CM

## 2015-12-10 HISTORY — DX: Personal history of antineoplastic chemotherapy: Z92.21

## 2015-12-10 HISTORY — DX: Personal history of malignant neoplasm of breast: Z85.3

## 2015-12-10 HISTORY — DX: Malignant neoplasm of unspecified site of unspecified female breast: C50.919

## 2015-12-10 HISTORY — PX: BREAST LUMPECTOMY: SHX2

## 2015-12-10 HISTORY — DX: Personal history of irradiation: Z92.3

## 2015-12-13 ENCOUNTER — Encounter: Payer: Self-pay | Admitting: Genetic Counselor

## 2015-12-13 ENCOUNTER — Ambulatory Visit (HOSPITAL_BASED_OUTPATIENT_CLINIC_OR_DEPARTMENT_OTHER): Payer: BLUE CROSS/BLUE SHIELD | Admitting: Genetic Counselor

## 2015-12-13 ENCOUNTER — Other Ambulatory Visit: Payer: BLUE CROSS/BLUE SHIELD

## 2015-12-13 ENCOUNTER — Encounter: Payer: Self-pay | Admitting: *Deleted

## 2015-12-13 DIAGNOSIS — Z803 Family history of malignant neoplasm of breast: Secondary | ICD-10-CM | POA: Diagnosis not present

## 2015-12-13 DIAGNOSIS — Z800 Family history of malignant neoplasm of digestive organs: Secondary | ICD-10-CM | POA: Insufficient documentation

## 2015-12-13 DIAGNOSIS — C50411 Malignant neoplasm of upper-outer quadrant of right female breast: Secondary | ICD-10-CM | POA: Diagnosis not present

## 2015-12-13 DIAGNOSIS — Z808 Family history of malignant neoplasm of other organs or systems: Secondary | ICD-10-CM | POA: Insufficient documentation

## 2015-12-13 DIAGNOSIS — Z315 Encounter for genetic counseling: Secondary | ICD-10-CM

## 2015-12-13 DIAGNOSIS — Z801 Family history of malignant neoplasm of trachea, bronchus and lung: Secondary | ICD-10-CM | POA: Diagnosis not present

## 2015-12-13 DIAGNOSIS — Z853 Personal history of malignant neoplasm of breast: Secondary | ICD-10-CM

## 2015-12-13 NOTE — Progress Notes (Addendum)
REFERRING PROVIDER: Aretta Nip, MD [place], [place] [address]   Vanessa Del, MD  PRIMARY PROVIDER:  Aretta Nip, MD  PRIMARY REASON FOR VISIT:  1. Breast cancer of upper-outer quadrant of right female breast ([place])   2. Family history of breast cancer      HISTORY OF PRESENT ILLNESS:   Vanessa [name], a 55 y.o. female, was seen for a [facility] cancer genetics consultation at the request of Dr. Jana [name] due to a personal and family history of cancer.  Vanessa [name] presents to clinic today to discuss the possibility of a hereditary predisposition to cancer, genetic testing, and to further clarify her future cancer risks, as well as potential cancer risks for family members.   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 This was treated with lumpectomy, chemotherapy and radiation.   CANCER HISTORY:   No history exists.     HORMONAL RISK FACTORS:  Menarche was at age 41.  First live birth at age 43.  OCP use for approximately 5 years.  Ovaries intact: yes.  Hysterectomy: yes.  Menopausal status: postmenopausal.  HRT use: 0 years. Colonoscopy: yes; normal. Mammogram within the last year: yes. Number of breast biopsies: 2. Up to date with pelvic exams:  yes. Any excessive radiation exposure in the past:  no  Past Medical History  Diagnosis Date  . Allergy   . Arthritis   . Anemia   . Hypertension   . Trigeminal neuralgia 05/2014  . Family history of breast cancer   . Family history of colon cancer   . Family history of thyroid cancer     Past Surgical History  Procedure Laterality Date  . Breast surgery      biopsy  . Abdominal hysterectomy    . Meniscus repair      L knee  . Hysterotomy    . Eye surgery      Social History   Social History  . Marital Status: Single    Spouse Name: N/A  . Number of Children: 2  . Years of Education: 12+    Occupational History  .  [employer]   Social History Main Topics  . Smoking status: Never Smoker   . Smokeless tobacco: Never Used  . Alcohol Use: No  . Drug Use: No  . Sexual Activity: No   Other Topics Concern  . None   Social History Narrative   Patient is single.    Patient has 2 children.    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  Significant diagnoses are listed below: Family History  Problem Relation Age of Onset  . Diabetes Mother   . Hypertension Mother   . Asthma Daughter   . Diabetes Sister   . Hypertension Sister   . Colon cancer Sister 12  . Thyroid cancer Sister 2  . Lung cancer Father   . Breast cancer Cousin     maternal first cousin  . Breast cancer Cousin 77    maternal first cousin - inflammatory    The patient has a sister who had colon and thyroid cancer at 55.  Her mother is alive and had seven siblings who did not have cancer.  Two of her sisters  whoever had one daguther each with bresat cancer, one had inflammatory breast cancer and died at 52.  The patient's mother had a paternal cousin with breast cancer.  The patient's father had lung cancer and died at 77.  Patient's maternal ancestors are of [place] American and Caucasian descent, and paternal ancestors are of Native [place], Caucasian and African American descent. There is no reported Ashkenazi Jewish ancestry. There is no known consanguinity.  GENETIC COUNSELING ASSESSMENT: Vanessa [name] is a 55 y.o. female with a personal and family history of breast cancer which is somewhat suggestive of a hereditary cancer syndrome and predisposition to cancer. We, therefore, discussed and recommended the following at today's visit.   DISCUSSION: We reviewed the characteristics, features and inheritance patterns of hereditary cancer syndromes. We also discussed genetic testing, including the  appropriate family members to test, the process of testing, insurance coverage and turn-around-time for results. We discussed the implications of a negative, positive and/or variant of uncertain significant result. We recommended Vanessa [name] pursue genetic testing for the Common Hereditary cancer gene panel. The Hereditary Gene Panel offered by [facility] includes sequencing and/or deletion duplication testing of the following 42 genes: APC, ATM, AXIN2, BARD1, BMPR1A, BRCA1, BRCA2, BRIP1, CDH1, CDKN2A, CHEK2, DICER1, EPCAM, GREM1, KIT, MEN1, MLH1, MSH2, MSH6, MUTYH, NBN, NF1, PALB2, PDGFRA, PMS2, POLD1, POLE, PTEN, RAD50, RAD51C, RAD51D, SDHA, SDHB, SDHC, SDHD, SMAD4, SMARCA4. STK11, TP53, TSC1, TSC2, and VHL.     Based on Vanessa [name] personal and family history of cancer, she meets medical criteria for genetic testing. Despite that she meets criteria, she may still have an out of pocket cost. We discussed that if her out of pocket cost for testing is over $100, the laboratory will call and confirm whether she wants to proceed with testing.  If the out of pocket cost of testing is less than $100 she will be billed by the genetic testing laboratory.   PLAN: After considering the risks, benefits, and limitations, Vanessa [name]  provided informed consent to pursue genetic testing and the blood sample was sent to [facility] for analysis of the Common Hereditary cancer panel. Results should be available within approximately 2-3 weeks' time, at which point they will be disclosed by telephone to Vanessa [name], as will any additional recommendations warranted by these results. Vanessa [name] will receive a summary of her genetic counseling visit and a copy of her results once available. This information will also be available in Epic. We encouraged Vanessa [name] to remain in contact with cancer genetics annually so that we can continuously update the family history and inform her of any changes in cancer genetics  and testing that may be of benefit for her family. Vanessa [name] questions were answered to her satisfaction today. Our contact information was provided should additional questions or concerns arise.  Lastly, we encouraged Vanessa [name] to remain in contact with cancer genetics annually so that we can continuously update the family history and inform her of any changes in cancer genetics and testing that may be of benefit for this family.   Ms.  [name] questions were answered to her satisfaction today. Our contact information was provided should additional questions or concerns arise. Thank you for the referral and allowing [place] to share in the care of your patient.   Shelli P. Florene Glen, [place], [facility] Certified Genetic Counselor [email or web address] phone: [phone]  The patient was seen for a total of 60 minutes in face-to-face genetic counseling.  This patient was discussed with Drs.  Magrinat, Lindi Adie and/or Burr Medico who agrees with the above.    _______________________________________________________________________ For Office Staff:  Number of people involved in session: 1 Was an Intern/ student involved with case: no

## 2015-12-18 ENCOUNTER — Ambulatory Visit (HOSPITAL_COMMUNITY)
Admission: RE | Admit: 2015-12-18 | Discharge: 2015-12-18 | Disposition: A | Discharge status: Home / Self Care | Payer: BLUE CROSS/BLUE SHIELD | Source: Ambulatory Visit | Attending: Cardiology | Admitting: Cardiology

## 2015-12-18 ENCOUNTER — Ambulatory Visit (HOSPITAL_BASED_OUTPATIENT_CLINIC_OR_DEPARTMENT_OTHER)
Admission: RE | Admit: 2015-12-18 | Discharge: 2015-12-18 | Disposition: A | Discharge status: Home / Self Care | Payer: BLUE CROSS/BLUE SHIELD | Source: Ambulatory Visit | Attending: Cardiology | Admitting: Cardiology

## 2015-12-18 ENCOUNTER — Encounter (HOSPITAL_COMMUNITY): Payer: Self-pay

## 2015-12-18 VITALS — BP 134/82 | HR 90 | Wt 275.2 lb

## 2015-12-18 DIAGNOSIS — I10 Essential (primary) hypertension: Secondary | ICD-10-CM | POA: Diagnosis not present

## 2015-12-18 DIAGNOSIS — C50411 Malignant neoplasm of upper-outer quadrant of right female breast: Secondary | ICD-10-CM | POA: Insufficient documentation

## 2015-12-18 DIAGNOSIS — Z09 Encounter for follow-up examination after completed treatment for conditions other than malignant neoplasm: Secondary | ICD-10-CM | POA: Diagnosis present

## 2015-12-18 DIAGNOSIS — I517 Cardiomegaly: Secondary | ICD-10-CM | POA: Diagnosis not present

## 2015-12-18 NOTE — Progress Notes (Signed)
  Echocardiogram 2D Echocardiogram has been performed.  Jennette Dubin 12/18/2015, 1:44 PM

## 2015-12-18 NOTE — Patient Instructions (Signed)
We will contact you in 3 months to schedule your next appointment and echocardiogram  

## 2015-12-19 NOTE — Progress Notes (Signed)
Patient ID: Vanessa [name], female   DOB: 1961-08-27, 55 y.o.   MRN: [id number] Oncologist: Dr. Jana Hakim  55 yo with breast cancer presents for cardiology evaluation prior to starting Herceptin.  She was diagnosed with triple positive breast cancer in 12/16.  Plans are lumpectomy/lymph node dissection on 12/30/15 followed by abraxane/Herceptin x 12 weeks then Herceptin alone to complete 1 year.  She will have radiation as well.  I reviewed today's echo: EF normal, mild LV hypertrophy.   She is doing well symptomatically.  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  She was diagnosed with HTN in the past but is on no meds and BP is ok today.  No prior heart problems.  No exertional dyspnea, orthopnea, palpitations, or chest pain.   Labs (12/16): K 3.7, creatinine 0.8  PMH: 1. HTN: Not currently on meds.  2. Trigeminal neuralgia 3. TAH 4. Breast cancer: Diagnosed 12/16, ER+/PR+/HER2+.   - Echo (1/17) with EF 60-65%, lateral s' 11, GLS -23.1%, mild LVH, normal RV size and systolic function.   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   FH: Father with lung cancer, grandmother died from MI.  HTN, DM.   ROS: All systems reviewed and negative except as per HPI.   Current Outpatient Prescriptions  Medication Sig Dispense Refill  . ergocalciferol (VITAMIN D2) 50000 units capsule Take 50,000 Units by mouth once a week.    . Multiple Vitamins-Minerals (CENTRUM SILVER ADULT 50+ PO) Take by mouth.    Marland Kitchen acetaminophen (TYLENOL) 325 MG tablet Take 650 mg by mouth every 6 (six) hours as needed. Reported on 12/18/2015    . diclofenac (VOLTAREN) 75 MG EC tablet Take 75 mg by mouth 2 (two) times daily as needed. Reported on 12/18/2015    . gabapentin (NEURONTIN) 600 MG tablet Take 1 tablet (600 mg total) by mouth 3 (three) times daily. (Patient not taking: Reported on 12/18/2015) 90 tablet 6  . ibuprofen (ADVIL,MOTRIN) 200 MG tablet Take 200 mg by  mouth every 6 (six) hours as needed. Reported on 12/18/2015     No current facility-administered medications for this encounter.   BP 134/82 mmHg  Pulse 90  Wt 275 lb 4 oz (124.853 kg)  SpO2 100% General: NAD Neck: No JVD, no thyromegaly or thyroid nodule.  Lungs: Clear to auscultation bilaterally with normal respiratory effort. CV: Nondisplaced PMI.  Heart regular S1/S2, no S3/S4, no murmur.  No peripheral edema.  No carotid bruit.  Normal pedal pulses.  Abdomen: Soft, nontender, no hepatosplenomegaly, no distention.  Skin: Intact without lesions or rashes.  Neurologic: Alert and oriented x 3.  Psych: Normal affect. Extremities: No clubbing or cyanosis.  HEENT: Normal.   Assessment/Plan: 1. Breast cancer: Plan to begin Herceptin-based therapy.  We reviewed the approximately 10% risk of some degree of myocardial depression from Herceptin.  I reviewed today's echo, EF is normal with normal global longitudinal strain.  She will proceed with Herceptin, we will repeat an echo in 3 months with office visit.  2. HTN: There is mild LV hypertrophy on echo.  She is not on antihypertensives but was given the diagnosis in the past.  BP is ok.  Will need to follow BP over time, may need treatment.  She should work on weight loss.   Loralie Champagne 12/19/2015

## 2015-12-20 ENCOUNTER — Encounter (HOSPITAL_BASED_OUTPATIENT_CLINIC_OR_DEPARTMENT_OTHER)
Admission: RE | Admit: 2015-12-20 | Discharge: 2015-12-20 | Disposition: A | Discharge status: Home / Self Care | Payer: BLUE CROSS/BLUE SHIELD | Source: Ambulatory Visit | Attending: Surgery | Admitting: Surgery

## 2015-12-20 ENCOUNTER — Encounter (HOSPITAL_BASED_OUTPATIENT_CLINIC_OR_DEPARTMENT_OTHER): Payer: Self-pay | Admitting: *Deleted

## 2015-12-20 ENCOUNTER — Other Ambulatory Visit: Payer: Self-pay

## 2015-12-20 DIAGNOSIS — C50911 Malignant neoplasm of unspecified site of right female breast: Secondary | ICD-10-CM | POA: Insufficient documentation

## 2015-12-20 DIAGNOSIS — Z01812 Encounter for preprocedural laboratory examination: Secondary | ICD-10-CM | POA: Diagnosis not present

## 2015-12-20 DIAGNOSIS — Z01818 Encounter for other preprocedural examination: Secondary | ICD-10-CM | POA: Diagnosis present

## 2015-12-20 DIAGNOSIS — C50411 Malignant neoplasm of upper-outer quadrant of right female breast: Secondary | ICD-10-CM | POA: Diagnosis not present

## 2015-12-20 LAB — CBC WITH DIFFERENTIAL/PLATELET
BASOS ABS: 0 10*3/uL (ref 0.0–0.1)
BASOS PCT: 1 %
Eosinophils Absolute: 0.1 10*3/uL (ref 0.0–0.7)
Eosinophils Relative: 3 %
HCT: 41.9 % (ref 36.0–46.0)
HEMOGLOBIN: 13.4 g/dL (ref 12.0–15.0)
Lymphocytes Relative: 52 %
Lymphs Abs: 2.1 10*3/uL (ref 0.7–4.0)
MCH: 27.5 pg (ref 26.0–34.0)
MCHC: 32 g/dL (ref 30.0–36.0)
MCV: 86 fL (ref 78.0–100.0)
MONOS PCT: 6 %
Monocytes Absolute: 0.2 10*3/uL (ref 0.1–1.0)
NEUTROS ABS: 1.5 10*3/uL — AB (ref 1.7–7.7)
NEUTROS PCT: 38 %
PLATELETS: 286 10*3/uL (ref 150–400)
RBC: 4.87 MIL/uL (ref 3.87–5.11)
RDW: 13.8 % (ref 11.5–15.5)
WBC: 3.9 10*3/uL — AB (ref 4.0–10.5)

## 2015-12-20 LAB — COMPREHENSIVE METABOLIC PANEL
ALK PHOS: 105 U/L (ref 38–126)
ALT: 14 U/L (ref 14–54)
ANION GAP: 7 (ref 5–15)
AST: 19 U/L (ref 15–41)
Albumin: 3.4 g/dL — ABNORMAL LOW (ref 3.5–5.0)
BILIRUBIN TOTAL: 0.3 mg/dL (ref 0.3–1.2)
BUN: 12 mg/dL (ref 6–20)
CALCIUM: 9.1 mg/dL (ref 8.9–10.3)
CO2: 28 mmol/L (ref 22–32)
CREATININE: 0.73 mg/dL (ref 0.44–1.00)
Chloride: 106 mmol/L (ref 101–111)
Glucose, Bld: 100 mg/dL — ABNORMAL HIGH (ref 65–99)
Potassium: 3.3 mmol/L — ABNORMAL LOW (ref 3.5–5.1)
Sodium: 141 mmol/L (ref 135–145)
TOTAL PROTEIN: 7.4 g/dL (ref 6.5–8.1)

## 2015-12-20 NOTE — Progress Notes (Signed)
PAT visit done, EKG reviewed and Anesthesia consult done by Dr. Imogene Burn.

## 2015-12-26 ENCOUNTER — Ambulatory Visit
Admission: RE | Admit: 2015-12-26 | Discharge: 2015-12-26 | Disposition: A | Discharge status: Home / Self Care | Payer: BLUE CROSS/BLUE SHIELD | Source: Ambulatory Visit | Attending: Surgery | Admitting: Surgery

## 2015-12-26 ENCOUNTER — Ambulatory Visit: Payer: Self-pay | Admitting: Genetic Counselor

## 2015-12-26 ENCOUNTER — Telehealth: Payer: Self-pay | Admitting: Genetic Counselor

## 2015-12-26 DIAGNOSIS — Z800 Family history of malignant neoplasm of digestive organs: Secondary | ICD-10-CM

## 2015-12-26 DIAGNOSIS — Z808 Family history of malignant neoplasm of other organs or systems: Secondary | ICD-10-CM

## 2015-12-26 DIAGNOSIS — C50911 Malignant neoplasm of unspecified site of right female breast: Secondary | ICD-10-CM

## 2015-12-26 DIAGNOSIS — C50411 Malignant neoplasm of upper-outer quadrant of right female breast: Secondary | ICD-10-CM

## 2015-12-26 DIAGNOSIS — Z803 Family history of malignant neoplasm of breast: Secondary | ICD-10-CM

## 2015-12-26 DIAGNOSIS — Z1379 Encounter for other screening for genetic and chromosomal anomalies: Secondary | ICD-10-CM

## 2015-12-26 NOTE — Telephone Encounter (Signed)
Negative genetic testing on the [facility] 42 gene panel.

## 2015-12-26 NOTE — Progress Notes (Signed)
HPI: Ms. Wiegel was previously seen in the [place] clinic due to a personal and family history of cancer and concerns regarding a hereditary predisposition to cancer. Please refer to our prior cancer genetics clinic note for more information regarding Ms. Trant medical, social and family histories, and our assessment and recommendations, at the time. Ms. Mellette recent genetic test results were disclosed to her, as were recommendations warranted by these results. These results and recommendations are discussed in more detail below.  FAMILY HISTORY:  We obtained a detailed, 4-generation family history.  Significant diagnoses are listed below: Family History  Problem Relation Age of Onset  . Diabetes Mother   . Hypertension Mother   . Asthma Daughter   . Diabetes Sister   . Hypertension Sister   . Colon cancer Sister 52  . Thyroid cancer Sister 45  . Lung cancer Father   . Breast cancer Cousin     maternal first cousin  . Breast cancer Cousin 47    maternal first cousin - inflammatory    The patient has a sister who had colon and thyroid cancer at 7. Her mother is alive and had seven siblings who did not have cancer. Two of her sisters whoever had one daguther each with bresat cancer, one had inflammatory breast cancer and died at 40. The patient's mother had a paternal cousin with breast cancer. The patient's father had lung cancer and died at 61. Patient's maternal ancestors are of [place] American and Caucasian descent, and paternal ancestors are of Native [place], Caucasian and African American descent. There is no reported Ashkenazi Jewish ancestry. There is no known consanguinity.  GENETIC TEST RESULTS: At the time of Ms. Sundstrom's visit, we recommended she pursue genetic testing of the Hereditary Common Cancer gene panel. The Hereditary Gene Panel offered by [facility] includes sequencing and/or deletion duplication testing of the following 42 genes:  APC, ATM, AXIN2, BARD1, BMPR1A, BRCA1, BRCA2, BRIP1, CDH1, CDKN2A, CHEK2, DICER1, EPCAM, GREM1, KIT, MEN1, MLH1, MSH2, MSH6, MUTYH, NBN, NF1, PALB2, PDGFRA, PMS2, POLD1, POLE, PTEN, RAD50, RAD51C, RAD51D, SDHA, SDHB, SDHC, SDHD, SMAD4, SMARCA4. STK11, TP53, TSC1, TSC2, and VHL.  The report date is December 22, 2015.  Genetic testing was normal, and did not reveal a deleterious mutation in these genes. The test report has been scanned into EPIC and is located under the Molecular Pathology section of the Results Review tab.   We discussed with Ms. Sabatino that since the current genetic testing is not perfect, it is possible there may be a gene mutation in one of these genes that current testing cannot detect, but that chance is small. We also discussed, that it is possible that another gene that has not yet been discovered, or that we have not yet tested, is responsible for the cancer diagnoses in the family, and it is, therefore, important to remain in touch with cancer genetics in the future so that we can continue to offer Ms. Ketterman the most up to date genetic testing.   CANCER SCREENING RECOMMENDATIONS: This result is reassuring and indicates that Ms. Fitch likely does not have an increased risk for a future cancer due to a mutation in one of these genes. This normal test also suggests that Ms. Rawlinson cancer was most likely not due to an inherited predisposition associated with one of these genes.  Most cancers happen by chance and this negative test suggests that her cancer falls into this category.  We, therefore, recommended she continue to follow  the cancer management and screening guidelines provided by her oncology and primary healthcare provider.   RECOMMENDATIONS FOR FAMILY MEMBERS: Women in this family might be at some increased risk of developing cancer, over the general population risk, simply due to the family history of cancer. We recommended women in this family have a yearly  mammogram beginning at age 55, or 71 years younger than the earliest onset of cancer, an an annual clinical breast exam, and perform monthly breast self-exams. Women in this family should also have a gynecological exam as recommended by their primary provider. All family members should have a colonoscopy by age 36.  FOLLOW-UP: Lastly, we discussed with Ms. Capece that cancer genetics is a rapidly advancing field and it is possible that new genetic tests will be appropriate for her and/or her family members in the future. We encouraged her to remain in contact with cancer genetics on an annual basis so we can update her personal and family histories and let her know of advances in cancer genetics that may benefit this family.   Our contact number was provided. Ms. Zapf questions were answered to her satisfaction, and she knows she is welcome to call us at anytime with additional questions or concerns.   Roma Kayser, MS, [facility] Certified Genetic Counselor [email or web address]

## 2015-12-27 ENCOUNTER — Encounter: Payer: Self-pay | Admitting: Oncology

## 2015-12-27 ENCOUNTER — Other Ambulatory Visit (HOSPITAL_COMMUNITY): Payer: Self-pay

## 2015-12-27 NOTE — Progress Notes (Signed)
I placed form for dr. Jana Hakim to sign  [employer] and [employer].

## 2015-12-29 ENCOUNTER — Encounter (HOSPITAL_BASED_OUTPATIENT_CLINIC_OR_DEPARTMENT_OTHER)
Admission: RE | Disposition: A | Discharge status: Home / Self Care | Payer: Self-pay | Source: Ambulatory Visit | Attending: Surgery

## 2015-12-29 ENCOUNTER — Ambulatory Visit (HOSPITAL_BASED_OUTPATIENT_CLINIC_OR_DEPARTMENT_OTHER): Payer: BLUE CROSS/BLUE SHIELD | Admitting: Anesthesiology

## 2015-12-29 ENCOUNTER — Ambulatory Visit: Payer: Self-pay | Admitting: Oncology

## 2015-12-29 ENCOUNTER — Ambulatory Visit (HOSPITAL_COMMUNITY): Payer: BLUE CROSS/BLUE SHIELD

## 2015-12-29 ENCOUNTER — Encounter (HOSPITAL_COMMUNITY)
Admission: RE | Admit: 2015-12-29 | Discharge: 2015-12-29 | Disposition: A | Discharge status: Home / Self Care | Payer: BLUE CROSS/BLUE SHIELD | Source: Ambulatory Visit | Attending: Surgery | Admitting: Surgery

## 2015-12-29 ENCOUNTER — Ambulatory Visit (HOSPITAL_BASED_OUTPATIENT_CLINIC_OR_DEPARTMENT_OTHER)
Admission: RE | Admit: 2015-12-29 | Discharge: 2015-12-29 | Disposition: A | Discharge status: Home / Self Care | Payer: BLUE CROSS/BLUE SHIELD | Source: Ambulatory Visit | Attending: Surgery | Admitting: Surgery

## 2015-12-29 ENCOUNTER — Encounter (HOSPITAL_BASED_OUTPATIENT_CLINIC_OR_DEPARTMENT_OTHER): Payer: Self-pay | Admitting: Anesthesiology

## 2015-12-29 ENCOUNTER — Ambulatory Visit
Admission: RE | Admit: 2015-12-29 | Discharge: 2015-12-29 | Disposition: A | Discharge status: Home / Self Care | Payer: BLUE CROSS/BLUE SHIELD | Source: Ambulatory Visit | Attending: Surgery | Admitting: Surgery

## 2015-12-29 DIAGNOSIS — M1990 Unspecified osteoarthritis, unspecified site: Secondary | ICD-10-CM | POA: Insufficient documentation

## 2015-12-29 DIAGNOSIS — C50411 Malignant neoplasm of upper-outer quadrant of right female breast: Secondary | ICD-10-CM | POA: Insufficient documentation

## 2015-12-29 DIAGNOSIS — Z800 Family history of malignant neoplasm of digestive organs: Secondary | ICD-10-CM | POA: Diagnosis not present

## 2015-12-29 DIAGNOSIS — C50911 Malignant neoplasm of unspecified site of right female breast: Secondary | ICD-10-CM | POA: Diagnosis present

## 2015-12-29 DIAGNOSIS — Z803 Family history of malignant neoplasm of breast: Secondary | ICD-10-CM | POA: Insufficient documentation

## 2015-12-29 DIAGNOSIS — Z95828 Presence of other vascular implants and grafts: Secondary | ICD-10-CM

## 2015-12-29 DIAGNOSIS — Z6841 Body Mass Index (BMI) 40.0 and over, adult: Secondary | ICD-10-CM | POA: Diagnosis not present

## 2015-12-29 DIAGNOSIS — C50111 Malignant neoplasm of central portion of right female breast: Secondary | ICD-10-CM | POA: Diagnosis present

## 2015-12-29 DIAGNOSIS — C50121 Malignant neoplasm of central portion of right male breast: Secondary | ICD-10-CM

## 2015-12-29 DIAGNOSIS — Z79899 Other long term (current) drug therapy: Secondary | ICD-10-CM | POA: Insufficient documentation

## 2015-12-29 DIAGNOSIS — I10 Essential (primary) hypertension: Secondary | ICD-10-CM | POA: Diagnosis not present

## 2015-12-29 DIAGNOSIS — Z170 Estrogen receptor positive status [ER+]: Secondary | ICD-10-CM | POA: Insufficient documentation

## 2015-12-29 HISTORY — DX: Other specified postprocedural states: R11.2

## 2015-12-29 HISTORY — PX: BREAST LUMPECTOMY WITH RADIOACTIVE SEED AND SENTINEL LYMPH NODE BIOPSY: SHX6550

## 2015-12-29 HISTORY — DX: Anxiety disorder, unspecified: F41.9

## 2015-12-29 HISTORY — DX: Other specified postprocedural states: Z98.890

## 2015-12-29 HISTORY — PX: PORTACATH PLACEMENT: SHX2246

## 2015-12-29 SURGERY — BREAST LUMPECTOMY WITH RADIOACTIVE SEED AND SENTINEL LYMPH NODE BIOPSY
Anesthesia: General | Site: Chest | Laterality: Right

## 2015-12-29 MED ORDER — MIDAZOLAM HCL 2 MG/2ML IJ SOLN
INTRAMUSCULAR | Status: AC
  Filled 2015-12-29: qty 2

## 2015-12-29 MED ORDER — HEPARIN (PORCINE) IN NACL 2-0.9 UNIT/ML-% IJ SOLN
INTRAMUSCULAR | Status: DC | PRN
  Administered 2015-12-29: 1 via INTRAVENOUS

## 2015-12-29 MED ORDER — PROPOFOL 10 MG/ML IV BOLUS
INTRAVENOUS | Status: AC
  Filled 2015-12-29: qty 40

## 2015-12-29 MED ORDER — HYDROMORPHONE HCL 1 MG/ML IJ SOLN
0.2500 mg | INTRAMUSCULAR | Status: DC | PRN
  Administered 2015-12-29 (×3): 0.5 mg via INTRAVENOUS

## 2015-12-29 MED ORDER — SUCCINYLCHOLINE CHLORIDE 20 MG/ML IJ SOLN
INTRAMUSCULAR | Status: DC | PRN
  Administered 2015-12-29: 100 mg via INTRAVENOUS

## 2015-12-29 MED ORDER — FENTANYL CITRATE (PF) 100 MCG/2ML IJ SOLN
INTRAMUSCULAR | Status: AC
  Filled 2015-12-29: qty 2

## 2015-12-29 MED ORDER — CLINDAMYCIN PHOSPHATE 300 MG/50ML IV SOLN: 300.0000 mg | Freq: Once | INTRAVENOUS | Status: DC

## 2015-12-29 MED ORDER — ARTIFICIAL TEARS OP OINT
TOPICAL_OINTMENT | OPHTHALMIC | Status: AC
  Filled 2015-12-29: qty 3.5

## 2015-12-29 MED ORDER — SCOPOLAMINE 1 MG/3DAYS TD PT72
MEDICATED_PATCH | TRANSDERMAL | Status: AC
  Filled 2015-12-29: qty 1

## 2015-12-29 MED ORDER — HYDROMORPHONE HCL 1 MG/ML IJ SOLN: 0.2500 mg | INTRAMUSCULAR | Status: DC | PRN

## 2015-12-29 MED ORDER — TRAMADOL HCL 50 MG PO TABS: 50.0000 mg | ORAL_TABLET | Freq: Four times a day (QID) | ORAL | Status: DC | PRN

## 2015-12-29 MED ORDER — SCOPOLAMINE 1 MG/3DAYS TD PT72
1.0000 | MEDICATED_PATCH | Freq: Once | TRANSDERMAL | Status: DC
  Administered 2015-12-29: 1.5 mg via TRANSDERMAL

## 2015-12-29 MED ORDER — CHLORHEXIDINE GLUCONATE 4 % EX LIQD: 1.0000 "application " | Freq: Once | CUTANEOUS | Status: DC

## 2015-12-29 MED ORDER — LACTATED RINGERS IV SOLN
INTRAVENOUS | Status: DC
  Administered 2015-12-29 (×3): via INTRAVENOUS

## 2015-12-29 MED ORDER — LIDOCAINE HCL (CARDIAC) 20 MG/ML IV SOLN
INTRAVENOUS | Status: AC
  Filled 2015-12-29: qty 5

## 2015-12-29 MED ORDER — HEPARIN SOD (PORK) LOCK FLUSH 100 UNIT/ML IV SOLN
INTRAVENOUS | Status: DC | PRN
  Administered 2015-12-29: 500 [IU] via INTRAVENOUS

## 2015-12-29 MED ORDER — BUPIVACAINE-EPINEPHRINE (PF) 0.25% -1:200000 IJ SOLN
INTRAMUSCULAR | Status: DC | PRN
  Administered 2015-12-29: 10 mL

## 2015-12-29 MED ORDER — PROMETHAZINE HCL 25 MG/ML IJ SOLN: 6.2500 mg | INTRAMUSCULAR | Status: DC | PRN

## 2015-12-29 MED ORDER — PHENYLEPHRINE HCL 10 MG/ML IJ SOLN
INTRAMUSCULAR | Status: AC
  Filled 2015-12-29: qty 1

## 2015-12-29 MED ORDER — GLYCOPYRROLATE 0.2 MG/ML IJ SOLN: 0.2000 mg | Freq: Once | INTRAMUSCULAR | Status: DC | PRN

## 2015-12-29 MED ORDER — HEPARIN (PORCINE) IN NACL 2-0.9 UNIT/ML-% IJ SOLN
INTRAMUSCULAR | Status: AC
  Filled 2015-12-29: qty 500

## 2015-12-29 MED ORDER — HEPARIN SOD (PORK) LOCK FLUSH 100 UNIT/ML IV SOLN
INTRAVENOUS | Status: AC
  Filled 2015-12-29: qty 5

## 2015-12-29 MED ORDER — FENTANYL CITRATE (PF) 100 MCG/2ML IJ SOLN
50.0000 ug | INTRAMUSCULAR | Status: AC | PRN
  Administered 2015-12-29 (×2): 50 ug via INTRAVENOUS
  Administered 2015-12-29: 100 ug via INTRAVENOUS
  Administered 2015-12-29: 25 ug via INTRAVENOUS
  Administered 2015-12-29: 50 ug via INTRAVENOUS
  Administered 2015-12-29: 25 ug via INTRAVENOUS

## 2015-12-29 MED ORDER — PHENYLEPHRINE 40 MCG/ML (10ML) SYRINGE FOR IV PUSH (FOR BLOOD PRESSURE SUPPORT)
PREFILLED_SYRINGE | INTRAVENOUS | Status: AC
  Filled 2015-12-29: qty 10

## 2015-12-29 MED ORDER — GLYCOPYRROLATE 0.2 MG/ML IJ SOLN
INTRAMUSCULAR | Status: AC
  Filled 2015-12-29: qty 1

## 2015-12-29 MED ORDER — LIDOCAINE HCL (CARDIAC) 20 MG/ML IV SOLN
INTRAVENOUS | Status: DC | PRN
  Administered 2015-12-29: 60 mg via INTRAVENOUS

## 2015-12-29 MED ORDER — ONDANSETRON HCL 4 MG/2ML IJ SOLN
INTRAMUSCULAR | Status: DC | PRN
  Administered 2015-12-29: 4 mg via INTRAVENOUS

## 2015-12-29 MED ORDER — DIPHENHYDRAMINE HCL 25 MG PO CAPS: 25.0000 mg | ORAL_CAPSULE | Freq: Four times a day (QID) | ORAL | Status: DC | PRN

## 2015-12-29 MED ORDER — DEXAMETHASONE SODIUM PHOSPHATE 10 MG/ML IJ SOLN
INTRAMUSCULAR | Status: AC
  Filled 2015-12-29: qty 1

## 2015-12-29 MED ORDER — HYDROMORPHONE HCL 1 MG/ML IJ SOLN
INTRAMUSCULAR | Status: AC
  Filled 2015-12-29: qty 1

## 2015-12-29 MED ORDER — BUPIVACAINE-EPINEPHRINE (PF) 0.5% -1:200000 IJ SOLN
INTRAMUSCULAR | Status: DC | PRN
  Administered 2015-12-29: 30 mL

## 2015-12-29 MED ORDER — METHYLENE BLUE 1 % INJ SOLN
INTRAMUSCULAR | Status: AC
  Filled 2015-12-29: qty 10

## 2015-12-29 MED ORDER — OXYCODONE-ACETAMINOPHEN 5-325 MG PO TABS: 1.0000 | ORAL_TABLET | ORAL | Status: DC | PRN

## 2015-12-29 MED ORDER — CLINDAMYCIN PHOSPHATE 300 MG/50ML IV SOLN
300.0000 mg | Freq: Once | INTRAVENOUS | Status: AC
  Administered 2015-12-29: 300 mg via INTRAVENOUS

## 2015-12-29 MED ORDER — ONDANSETRON HCL 4 MG/2ML IJ SOLN
INTRAMUSCULAR | Status: AC
  Filled 2015-12-29: qty 2

## 2015-12-29 MED ORDER — DEXAMETHASONE SODIUM PHOSPHATE 4 MG/ML IJ SOLN
INTRAMUSCULAR | Status: DC | PRN
  Administered 2015-12-29: 10 mg via INTRAVENOUS

## 2015-12-29 MED ORDER — PROPOFOL 10 MG/ML IV BOLUS
INTRAVENOUS | Status: DC | PRN
  Administered 2015-12-29: 20 mg via INTRAVENOUS
  Administered 2015-12-29: 250 mg via INTRAVENOUS
  Administered 2015-12-29: 50 mg via INTRAVENOUS

## 2015-12-29 MED ORDER — MIDAZOLAM HCL 2 MG/2ML IJ SOLN
1.0000 mg | INTRAMUSCULAR | Status: DC | PRN
  Administered 2015-12-29: 1.5 mg via INTRAVENOUS
  Administered 2015-12-29: 2 mg via INTRAVENOUS

## 2015-12-29 MED ORDER — SUCCINYLCHOLINE CHLORIDE 20 MG/ML IJ SOLN
INTRAMUSCULAR | Status: AC
  Filled 2015-12-29: qty 1

## 2015-12-29 MED ORDER — ONDANSETRON 4 MG PO TBDP: 4.0000 mg | ORAL_TABLET | Freq: Three times a day (TID) | ORAL | Status: DC | PRN

## 2015-12-29 MED ORDER — TECHNETIUM TC 99M SULFUR COLLOID FILTERED
1.0000 | Freq: Once | INTRAVENOUS | Status: AC | PRN
  Administered 2015-12-29: 1 via INTRADERMAL

## 2015-12-29 MED ORDER — 0.9 % SODIUM CHLORIDE (POUR BTL) OPTIME
TOPICAL | Status: DC | PRN
  Administered 2015-12-29: 1000 mL

## 2015-12-29 MED ORDER — BUPIVACAINE-EPINEPHRINE (PF) 0.25% -1:200000 IJ SOLN
INTRAMUSCULAR | Status: AC
  Filled 2015-12-29: qty 30

## 2015-12-29 MED ORDER — EPHEDRINE SULFATE 50 MG/ML IJ SOLN
INTRAMUSCULAR | Status: AC
  Filled 2015-12-29: qty 1

## 2015-12-29 MED ORDER — PHENYLEPHRINE HCL 10 MG/ML IJ SOLN
INTRAMUSCULAR | Status: DC | PRN
  Administered 2015-12-29 (×2): 80 ug via INTRAVENOUS

## 2015-12-29 MED ORDER — SODIUM CHLORIDE 0.9 % IJ SOLN
INTRAMUSCULAR | Status: AC
  Filled 2015-12-29: qty 10

## 2015-12-29 MED ORDER — ATROPINE SULFATE 0.4 MG/ML IJ SOLN
INTRAMUSCULAR | Status: AC
  Filled 2015-12-29: qty 1

## 2015-12-29 MED ORDER — CLINDAMYCIN PHOSPHATE 300 MG/50ML IV SOLN
INTRAVENOUS | Status: AC
Start: 2015-12-29 — End: 2015-12-29
  Filled 2015-12-29: qty 50

## 2015-12-29 SURGICAL SUPPLY — 74 items
APL SKNCLS STERI-STRIP NONHPOA (GAUZE/BANDAGES/DRESSINGS)
APPLIER CLIP 9.375 MED OPEN (MISCELLANEOUS) ×3
APR CLP MED 9.3 20 MLT OPN (MISCELLANEOUS) ×2
BAG DECANTER FOR FLEXI CONT (MISCELLANEOUS) ×3 IMPLANT
BENZOIN TINCTURE PRP APPL 2/3 (GAUZE/BANDAGES/DRESSINGS) IMPLANT
BINDER BREAST LRG (GAUZE/BANDAGES/DRESSINGS) IMPLANT
BINDER BREAST MEDIUM (GAUZE/BANDAGES/DRESSINGS) IMPLANT
BINDER BREAST XLRG (GAUZE/BANDAGES/DRESSINGS) IMPLANT
BINDER BREAST XXLRG (GAUZE/BANDAGES/DRESSINGS) ×1 IMPLANT
BLADE HEX COATED 2.75 (ELECTRODE) ×2 IMPLANT
BLADE SURG 11 STRL SS (BLADE) ×3 IMPLANT
BLADE SURG 15 STRL LF DISP TIS (BLADE) ×2 IMPLANT
BLADE SURG 15 STRL SS (BLADE) ×3
CANISTER SUCT 1200ML W/VALVE (MISCELLANEOUS) ×3 IMPLANT
CHLORAPREP W/TINT 26ML (MISCELLANEOUS) ×4 IMPLANT
CLIP APPLIE 9.375 MED OPEN (MISCELLANEOUS) ×2 IMPLANT
COVER BACK TABLE 60X90IN (DRAPES) ×3 IMPLANT
COVER MAYO STAND STRL (DRAPES) ×3 IMPLANT
COVER PROBE 5X48 (MISCELLANEOUS) ×3
COVER PROBE W GEL 5X96 (DRAPES) ×3 IMPLANT
DECANTER SPIKE VIAL GLASS SM (MISCELLANEOUS) IMPLANT
DEVICE DUBIN W/COMP PLATE 8390 (MISCELLANEOUS) ×3 IMPLANT
DRAPE C-ARM 42X72 X-RAY (DRAPES) ×3 IMPLANT
DRAPE LAPAROSCOPIC ABDOMINAL (DRAPES) ×3 IMPLANT
DRAPE UTILITY XL STRL (DRAPES) ×3 IMPLANT
DRSG TEGADERM 2-3/8X2-3/4 SM (GAUZE/BANDAGES/DRESSINGS) IMPLANT
ELECT COATED BLADE 2.86 ST (ELECTRODE) ×3 IMPLANT
ELECT REM PT RETURN 9FT ADLT (ELECTROSURGICAL) ×3
ELECTRODE REM PT RTRN 9FT ADLT (ELECTROSURGICAL) ×2 IMPLANT
GEL ULTRASOUND 8.5O AQUASONIC (MISCELLANEOUS) ×3 IMPLANT
GLOVE BIOGEL PI IND STRL 7.0 (GLOVE) IMPLANT
GLOVE BIOGEL PI IND STRL 8 (GLOVE) ×2 IMPLANT
GLOVE BIOGEL PI INDICATOR 7.0 (GLOVE) ×2
GLOVE BIOGEL PI INDICATOR 8 (GLOVE) ×2
GLOVE ECLIPSE 6.5 STRL STRAW (GLOVE) ×2 IMPLANT
GLOVE ECLIPSE 8.0 STRL XLNG CF (GLOVE) ×5 IMPLANT
GLOVE EXAM NITRILE MD LF STRL (GLOVE) ×2 IMPLANT
GOWN STRL REUS W/ TWL LRG LVL3 (GOWN DISPOSABLE) ×4 IMPLANT
GOWN STRL REUS W/TWL LRG LVL3 (GOWN DISPOSABLE) ×12
HEMOSTAT SNOW SURGICEL 2X4 (HEMOSTASIS) ×2 IMPLANT
IV KIT MINILOC 20X1 SAFETY (NEEDLE) IMPLANT
KIT CVR 48X5XPRB PLUP LF (MISCELLANEOUS) ×2 IMPLANT
KIT MARKER MARGIN INK (KITS) ×3 IMPLANT
KIT PORT POWER 8FR ISP CVUE (Catheter) ×1 IMPLANT
LIQUID BAND (GAUZE/BANDAGES/DRESSINGS) ×4 IMPLANT
NDL HYPO 25X1 1.5 SAFETY (NEEDLE) ×2 IMPLANT
NDL SAFETY ECLIPSE 18X1.5 (NEEDLE) IMPLANT
NDL SPNL 22GX3.5 QUINCKE BK (NEEDLE) IMPLANT
NEEDLE HYPO 18GX1.5 SHARP (NEEDLE)
NEEDLE HYPO 22GX1.5 SAFETY (NEEDLE) ×1 IMPLANT
NEEDLE HYPO 25X1 1.5 SAFETY (NEEDLE) ×3 IMPLANT
NEEDLE SPNL 22GX3.5 QUINCKE BK (NEEDLE) IMPLANT
NS IRRIG 1000ML POUR BTL (IV SOLUTION) ×3 IMPLANT
PACK BASIN DAY SURGERY FS (CUSTOM PROCEDURE TRAY) ×3 IMPLANT
PENCIL BUTTON HOLSTER BLD 10FT (ELECTRODE) ×3 IMPLANT
SET SHEATH INTRODUCER 10FR (MISCELLANEOUS) IMPLANT
SHEATH COOK PEEL AWAY SET 9F (SHEATH) IMPLANT
SLEEVE SCD COMPRESS KNEE MED (MISCELLANEOUS) ×4 IMPLANT
SPONGE GAUZE 4X4 12PLY STER LF (GAUZE/BANDAGES/DRESSINGS) IMPLANT
SPONGE LAP 4X18 X RAY DECT (DISPOSABLE) ×3 IMPLANT
STRIP CLOSURE SKIN 1/2X4 (GAUZE/BANDAGES/DRESSINGS) IMPLANT
SUT MNCRL AB 4-0 PS2 18 (SUTURE) ×3 IMPLANT
SUT MON AB 4-0 PC3 18 (SUTURE) ×3 IMPLANT
SUT PROLENE 2 0 CT2 30 (SUTURE) IMPLANT
SUT PROLENE 2 0 SH DA (SUTURE) ×3 IMPLANT
SUT SILK 2 0 TIES 17X18 (SUTURE)
SUT SILK 2-0 18XBRD TIE BLK (SUTURE) IMPLANT
SUT VICRYL 3-0 CR8 SH (SUTURE) ×4 IMPLANT
SYR 5ML LUER SLIP (SYRINGE) ×3 IMPLANT
SYR CONTROL 10ML LL (SYRINGE) ×3 IMPLANT
TOWEL OR 17X24 6PK STRL BLUE (TOWEL DISPOSABLE) ×6 IMPLANT
TOWEL OR NON WOVEN STRL DISP B (DISPOSABLE) ×3 IMPLANT
TUBE CONNECTING 20X1/4 (TUBING) ×3 IMPLANT
YANKAUER SUCT BULB TIP NO VENT (SUCTIONS) ×3 IMPLANT

## 2015-12-29 NOTE — Interval H&P Note (Signed)
History and Physical Interval Note:  12/29/2015 3:39 PM  RYENN CHESTERMAN  has presented today for surgery, with the diagnosis of right breast cancer  The various methods of treatment have been discussed with the patient and family. After consideration of risks, benefits and other options for treatment, the patient has consented to  Procedure(s): RIGHT BREAST  RADIOACTIVE SEED LOCALIZATION LUMPECTOMY AND RIGHT SENTINEL LYMPH NODE MAPPING (Right) INSERTION PORT-A-CATH (N/A) as a surgical intervention .  The patient's history has been reviewed, patient examined, no change in status, stable for surgery.  I have reviewed the patient's chart and labs.  Questions were answered to the patient's satisfaction.  Discussed port placement and complications of a port.   Risk of bleeding infection  Collapse lung bleeding from lung,  Death  DVT,  Port failure and migration heart injury, blood vessel injury,  Nerve injury and the need for more surgery. She agrees to proceed.    Arletta Lumadue A.

## 2015-12-29 NOTE — H&P (Signed)
H&P   Vanessa [name] (MR# [id number])      H&P Info    Chief Strategy Officer Note Status Last Update User Last Update Date/Time   Vanessa Luna, MD Signed Vanessa Luna, MD     H&P    Expand All Collapse All   Vanessa [name]  Location: [facility] Patient #: [id number] DOB: [birth year] Single / Language: Vanessa [name] / Race: Black or African American Female  History of Present Illness Vanessa Moores A. Tanayah Squitieri MD;  Patient words: right breast cancer   Patient sent at the request of Dr. Glennon [name] for right breast cancer. This is picked up on screening mammogram. 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 Receptors PENDING. She has 2 cousins had breast cancer in her 73s. She has a history of colon cancer in the family. Denies any history of breast pain, breast mass or nipple discharge. She was experiencing some right breast discomfort prior to her mammogram.            Breast, right, needle core biopsy, 10:00 o'clock - INVASIVE DUCTAL CARCINOMA. - DUCTAL CARCINOMA IN SITU. - ASSOCIATED CALCIFICATION. - SEE COMMENT.    CLINICAL DATA: Post right breast ultrasound-guided biopsy.  EXAM: DIAGNOSTIC RIGHT MAMMOGRAM POST ULTRASOUND BIOPSY  COMPARISON: Previous exam(s).  FINDINGS: Mammographic images were obtained following ultrasound guided biopsy of the mass located within the right breast at the 10 o'clock position. The ribbon shaped clip is in appropriate position.  IMPRESSION: Appropriate position of clip following right breast ultrasound-guided biopsy.  Final Assessment: Post Procedure Mammograms for Marker Placement   Electronically Signed By: Vanessa [name] M.D. On: 11/15/2015 16     CLINICAL DATA: Recall from screening mammogram.  EXAM: DIGITAL DIAGNOSTIC RIGHT MAMMOGRAM WITH CAD  ULTRASOUND RIGHT BREAST  COMPARISON: Previous exam(s).  ACR Breast Density  Category b: There are scattered areas of fibroglandular density.  FINDINGS: There is a small irregular mass present locating within the upper-outer quadrant of the right breast at the 10 o'clock position approximately 7 cm from the nipple. This does contain suspicious internal calcifications.  Mammographic images were processed with CAD.  On physical exam, there is a small, mobile, firm mass present located within the right breast at 10 o'clock position 7 cm from the nipple. There is no palpable right axillary adenopathy.  Targeted ultrasound is performed, showing an irregularly marginated hypoechoic mass located within the right breast at the 10 o'clock position 7 cm from the nipple corresponding to the mammographic and palpable finding. This measures 11 x 10 x 7 mm in size and and is suspicious for invasive mammary carcinoma. Tissue sampling via ultrasound-guided core biopsy is recommended and will be scheduled.  Ultrasound of the right axilla demonstrates normal axillary contents and no evidence for adenopathy.  IMPRESSION: 1.1 cm irregular mass located within the right breast at 10 o'clock position 7 cm from the nipple. Tissue sampling via ultrasound-guided core biopsy is recommended and will be scheduled.  RECOMMENDATION: Right breast ultrasound-guided core biopsy.  I have discussed the findings and recommendations with the patient. Results were also provided in writing at the conclusion of the visit. If applicable, a reminder letter will be sent to the patient regarding the next appointment.  BI-RADS CATEGORY 4: Suspicious.   Electronically Signed By: Vanessa [name] M.D. On: 11/13/2015 11:08.  The patient is a 55 year old female.   Other Problems Vanessa [name], Arthritis Breast Cancer Lump In Breast  Past Surgical History Vanessa [name], Vanessa [name];  Breast Biopsy Right. Hysterectomy (not due to cancer) - Complete Knee Surgery Left. Oral  Surgery  Diagnostic Studies History Vanessa [name], [place];  Colonoscopy 1-5 years ago Mammogram within last year  Allergies Vanessa [name], [place];  Penicillin G Potassium *PENICILLINS* Codeine Phosphate *ANALGESICS - OPIOID* Darvocet A500 *ANALGESICS - OPIOID* Demerol *ANALGESICS - OPIOID* Erythromycin *DERMATOLOGICALS* Flexeril *MUSCULOSKELETAL THERAPY AGENTS* Percocet *ANALGESICS - OPIOID* Sulfa 10 *OPHTHALMIC AGENTS*  Medication History Vanessa [name], CMA;  Diclofenac Sodium (75MG  Tablet DR, Oral) Active. Gabapentin (600MG  Tablet, Oral) Active. Medications Reconciled  Social History Vanessa [name], [place];  Alcohol use Occasional alcohol use. Caffeine use Carbonated beverages, Coffee, Tea. No drug use Tobacco use Never smoker.  Family History Vanessa [name], [place];  Alcohol Abuse Father. Arthritis Mother. Colon Cancer Sister. Diabetes Mellitus Mother, Sister. Hypertension Mother, Sister. Respiratory Condition Father. Thyroid problems Sister.  Pregnancy / Birth History Vanessa [name], [place];  Age at menarche 11 years. Gravida 2 Maternal age 53-25 Para 2     Review of Systems (Vanessa [name] CMA;  General Not Present- Appetite Loss, Chills, Fatigue, Fever, Night Sweats, Weight Gain and Weight Loss. Skin Not Present- Change in Wart/Mole, Dryness, Hives, Jaundice, New Lesions, Non-Healing Wounds, Rash and Ulcer. HEENT Present- Wears glasses/contact lenses. Not Present- Earache, Hearing Loss, Hoarseness, Nose Bleed, Oral Ulcers, Ringing in the Ears, Seasonal Allergies, Sinus Pain, Sore Throat, Visual Disturbances and Yellow Eyes. Breast Present- Breast Mass. Not Present- Breast Pain, Nipple Discharge and Skin Changes. Cardiovascular Not Present- Chest Pain, Difficulty Breathing Lying Down, Leg Cramps, Palpitations, Rapid Heart Rate, Shortness of Breath and Swelling of Extremities. Gastrointestinal Not Present- Abdominal  Pain, Bloating, Bloody Stool, Change in Bowel Habits, Chronic diarrhea, Constipation, Difficulty Swallowing, Excessive gas, Gets full quickly at meals, Hemorrhoids, Indigestion, Nausea, Rectal Pain and Vomiting. Female Genitourinary Not Present- Frequency, Nocturia, Painful Urination, Pelvic Pain and Urgency. Musculoskeletal Not Present- Back Pain, Joint Pain, Joint Stiffness, Muscle Pain, Muscle Weakness and Swelling of Extremities. Neurological Not Present- Decreased Memory, Fainting, Headaches, Numbness, Seizures, Tingling, Tremor, Trouble walking and Weakness. Psychiatric Not Present- Anxiety, Bipolar, Change in Sleep Pattern, Depression, Fearful and Frequent crying. Endocrine Not Present- Cold Intolerance, Excessive Hunger, Hair Changes, Heat Intolerance, Hot flashes and New Diabetes. Hematology Not Present- Easy Bruising, Excessive bleeding, Gland problems, HIV and Persistent Infections.  [employer] R. [name] CMA;  11/20/2015 2:30 PM Weight: 270.13 lb Height: 64in Body Surface Area: 2.22 m Body Mass Index: 46.37 kg/m  BP: 152/98 (Sitting, Left Arm, Standard)      Physical Exam (Shermar Friedland A. Zemira Zehring MD;   General Mental Status-Alert. General Appearance-Consistent with stated age. Hydration-Well hydrated. Voice-Normal.  Head and Neck Head-normocephalic, atraumatic with no lesions or palpable masses. Trachea-midline. Thyroid Gland Characteristics - normal size and consistency.  Eye Eyeball - Bilateral-Extraocular movements intact. Sclera/Conjunctiva - Bilateral-No scleral icterus.  Chest and Lung Exam Chest and lung exam reveals -quiet, even and easy respiratory effort with no use of accessory muscles and on auscultation, normal breath sounds, no adventitious sounds and normal vocal resonance. Inspection Chest Wall - Normal. Back - normal.  Breast Breast - Left-Symmetric, Non Tender, No Biopsy scars, no Dimpling, No Inflammation, No  Lumpectomy scars, No Mastectomy scars, No Peau d' Orange. Breast - Right-Symmetric, Non Tender, No Biopsy scars, no Dimpling, No Inflammation, No Lumpectomy scars, No Mastectomy scars, No Peau d' Orange. Breast Lump-No Palpable Breast Mass.  Cardiovascular Cardiovascular examination reveals -normal heart sounds, regular rate  and rhythm with no murmurs and normal pedal pulses bilaterally.  Abdomen Inspection Inspection of the abdomen reveals - No Hernias. Skin - Scar - no surgical scars. Palpation/Percussion Palpation and Percussion of the abdomen reveal - Soft, Non Tender, No Rebound tenderness, No Rigidity (guarding) and No hepatosplenomegaly. Auscultation Auscultation of the abdomen reveals - Bowel sounds normal.  Neurologic Neurologic evaluation reveals -alert and oriented x 3 with no impairment of recent or remote memory. Mental Status-Normal.  Musculoskeletal Normal Exam - Left-Upper Extremity Strength Normal and Lower Extremity Strength Normal. Normal Exam - Right-Upper Extremity Strength Normal and Lower Extremity Strength Normal.  Lymphatic Head & Neck  General Head & Neck Lymphatics: Bilateral - Description - Normal. Axillary  General Axillary Region: Bilateral - Description - Normal. Tenderness - Non Tender. Femoral & Inguinal  Generalized Femoral & Inguinal Lymphatics: Bilateral - Description - Normal. Tenderness - Non Tender.    Assessment & Plan (Deyci Gesell A. Melannie Metzner MD;  BREAST CANCER, RIGHT (C50.911) Impression: Discussed breast conservation with right breast lumpectomy and sentinel lymph node mapping. Discussed mastectomy reconstruction. Pros and cons of each were reviewed. She has opted for breast conservation surgery with right breast seed Risk of lumpectomy include bleeding, infection, seroma, more surgery, use of seed/wire, wound care, cosmetic deformity and the need for other treatments, death , blood clots, death. Pt agrees to proceed. Risk of  sentinel lymph node mapping include bleeding, infection, lymphedema, shoulder pain. stiffness, dye allergy. cosmetic deformity , blood clots, death, need for more surgery. Pt agres to proceed. localized lumpectomy and right axillary cyst lymph node mapping. Pt will need port for post op chemotherapy.  Risk of bleeding  Infection,  Collapse lung   Bleeding around the lung cardiac injury, mediastinal injury,  Nerve injury resulting in limb loss of function catheter migration,  Embolization and need for removal.   Current Plans Referred to Genetic Counseling, for evaluation and follow up (Medical Genetics). Routine. Referred to Oncology, for evaluation and follow up (Oncology). Routine. Referred to Radiation Oncology, for evaluation and follow up (Radiation Oncology). Routine. Referred to Physical Therapy, for evaluation and follow up (Physical Therapy). Routine. We discussed the staging and pathophysiology of breast cancer. We discussed all of the different options for treatment for breast cancer including surgery, chemotherapy, radiation therapy, Herceptin, and antiestrogen therapy. We discussed a sentinel lymph node biopsy as she does not appear to having lymph node involvement right now. We discussed the performance of that with injection of radioactive tracer and blue dye. We discussed that she would have an incision underneath her axillary hairline. We discussed that there is a bout a 10-20% chance of having a positive node with a sentinel lymph node biopsy and we will await the permanent pathology to make any other first further decisions in terms of her treatment. One of these options might be to return to the operating room to perform an axillary lymph node dissection. We discussed about a 1-2% risk lifetime of chronic shoulder pain as well as lymphedema associated with a sentinel lymph node biopsy. We discussed the options for treatment of the breast cancer which included lumpectomy versus a  mastectomy. We discussed the performance of the lumpectomy with a wire placement. We discussed a 10-20% chance of a positive margin requiring reexcision in the operating room. We also discussed that she may need radiation therapy or antiestrogen therapy or both if she undergoes lumpectomy. We discussed the mastectomy and the postoperative care for that as well. We discussed that there is  no difference in her survival whether she undergoes lumpectomy with radiation therapy or antiestrogen therapy versus a mastectomy. There is a slight difference in the local recurrence rate being 3-5% with lumpectomy and about 1% with a mastectomy. We discussed the risks of operation including bleeding, infection, possible reoperation. She understands her further therapy will be based on what her stages at the time of her operation.  Pt Education - [facility] Breast Cancer Information Given - [facility] "Breast Journey" Package You are being scheduled for surgery - Our schedulers will call you.  You should hear from our office's scheduling department within 5 working days about the location, date, and time of surgery. We try to make accommodations for patient's preferences in scheduling surgery, but sometimes the OR schedule or the surgeon's schedule prevents [place] from making those accommodations.  If you have not heard from our office [phone]) in 5 working days, call the office and ask for your surgeon's nurse.  If you have other questions about your diagnosis, plan, or surgery, call the office and ask for your surgeon's nurse.  Pt Education - [facility] Breast Biopsy HCI: discussed with patient and provided information. Pt Education - ABC (After Breast Cancer) Class Info: discussed with patient and provided information.

## 2015-12-29 NOTE — Discharge Instructions (Signed)
[place] Office Phone Number [phone]  BREAST BIOPSY/ LUMPECTOMY: POST OP INSTRUCTIONS  Always review your discharge instruction sheet given to you by the facility where your surgery was performed.  IF YOU HAVE DISABILITY OR FAMILY LEAVE FORMS, YOU MUST BRING THEM TO THE OFFICE FOR PROCESSING.  DO NOT GIVE THEM TO YOUR DOCTOR.  1. A prescription for pain medication may be given to you upon discharge.  Take your pain medication as prescribed, if needed.  If narcotic pain medicine is not needed, then you may take acetaminophen (Tylenol) or ibuprofen (Advil) as needed. 2. Take your usually prescribed medications unless otherwise directed 3. If you need a refill on your pain medication, please contact your pharmacy.  They will contact our office to request authorization.  Prescriptions will not be filled after 5pm or on week-ends. 4. You should eat very light the first 24 hours after surgery, such as soup, crackers, pudding, etc.  Resume your normal diet the day after surgery. 5. Most patients will experience some swelling and bruising in the breast.  Ice packs and a good support bra will help.  Swelling and bruising can take several days to resolve.  6. It is common to experience some constipation if taking pain medication after surgery.  Increasing fluid intake and taking a stool softener will usually help or prevent this problem from occurring.  A mild laxative (Milk of Magnesia or Miralax) should be taken according to package directions if there are no bowel movements after 48 hours. 7. Unless discharge instructions indicate otherwise, you may remove your bandages 24-48 hours after surgery, and you may shower at that time.  You may have steri-strips (small skin tapes) in place directly over the incision.  These strips should be left on the skin for 7-10 days.  If your surgeon used skin glue on the incision, you may shower in 24 hours.  The glue will flake off over the next 2-3  weeks.  Any sutures or staples will be removed at the office during your follow-up visit. 8. ACTIVITIES:  You may resume regular daily activities (gradually increasing) beginning the next day.  Wearing a good support bra or sports bra minimizes pain and swelling.  You may have sexual intercourse when it is comfortable. a. You may drive when you no longer are taking prescription pain medication, you can comfortably wear a seatbelt, and you can safely maneuver your car and apply brakes. b. RETURN TO WORK:  ______________________________________________________________________________________ 9. You should see your doctor in the office for a follow-up appointment approximately two weeks after your surgery.  Your doctors nurse will typically make your follow-up appointment when she calls you with your pathology report.  Expect your pathology report 2-3 business days after your surgery.  You may call to check if you do not hear from [place] after three days. OTHER INSTRUCTIONS: __________________ WHEN TO CALL YOUR DOCTOR: 1. Fever over 101.0 2. Nausea and/or vomiting. 3. Extreme swelling or bruising. 4. Continued bleeding from incision. 5. Increased pain, redness, or drainage from the incision.  The clinic staff is available to answer your questions during regular business hours.  Please dont hesitate to call and ask to speak to one of the nurses for clinical concerns.  If you have a medical emergency, go to the nearest emergency room or call 911.  A surgeon from [facility] is always on call at the hospital.  For further questions, please visit [email or web address]        PORT-A-CATH:  POST OP INSTRUCTIONS  Always review your discharge instruction sheet given to you by the facility where your surgery was performed.   1. A prescription for pain medication may be given to you upon discharge. Take your pain medication as prescribed, if needed. If narcotic pain medicine is not  needed, then you make take acetaminophen (Tylenol) or ibuprofen (Advil) as needed.  2. Take your usually prescribed medications unless otherwise directed. 3. If you need a refill on your pain medication, please contact our office. All narcotic pain medicine now requires a paper prescription.  Phoned in and fax refills are no longer allowed by law.  Prescriptions will not be filled after 5 pm or on weekends.  4. You should follow a light diet for the remainder of the day after your procedure. 5. Most patients will experience some mild swelling and/or bruising in the area of the incision. It may take several days to resolve. 6. It is common to experience some constipation if taking pain medication after surgery. Increasing fluid intake and taking a stool softener (such as Colace) will usually help or prevent this problem from occurring. A mild laxative (Milk of Magnesia or Miralax) should be taken according to package directions if there are no bowel movements after 48 hours.  7. Unless discharge instructions indicate otherwise, you may remove your bandages 48 hours after surgery, and you may shower at that time. You may have steri-strips (small white skin tapes) in place directly over the incision.  These strips should be left on the skin for 7-10 days.  If your surgeon used Dermabond (skin glue) on the incision, you may shower in 24 hours.  The glue will flake off over the next 2-3 weeks.  8. If your port is left accessed at the end of surgery (needle left in port), the dressing cannot get wet and should only by changed by a healthcare professional. When the port is no longer accessed (when the needle has been removed), follow step 7.   9. ACTIVITIES:  Limit activity involving your arms for the next 72 hours. Do no strenuous exercise or activity for 1 week. You may drive when you are no longer taking prescription pain medication, you can comfortably wear a seatbelt, and you can maneuver your car. 10.You may  need to see your doctor in the office for a follow-up appointment.  Please       check with your doctor.  11.When you receive a new Port-a-Cath, you will get a product guide and        ID card.  Please keep them in case you need them.  WHEN TO CALL YOUR DOCTOR [phone]): 1. Fever over 101.0 2. Chills 3. Continued bleeding from incision 4. Increased redness and tenderness at the site 5. Shortness of breath, difficulty breathing   The clinic staff is available to answer your questions during regular business hours. Please dont hesitate to call and ask to speak to one of the nurses or medical assistants for clinical concerns. If you have a medical emergency, go to the nearest emergency room or call 911.  A surgeon from [facility] is always on call at the hospital.     For further information, please visit [email or web address]    Take the benedryl with your pain medication to combat the itching  Take medication with food   Post Anesthesia Home Care Instructions  Activity: Get plenty of rest for the remainder of the day. A responsible adult should stay with you  for 24 hours following the procedure.  For the next 24 hours, DO NOT: -Drive a car -Paediatric nurse -Drink alcoholic beverages -Take any medication unless instructed by your physician -Make any legal decisions or sign important papers.  Meals: Start with liquid foods such as gelatin or soup. Progress to regular foods as tolerated. Avoid greasy, spicy, heavy foods. If nausea and/or vomiting occur, drink only clear liquids until the nausea and/or vomiting subsides. Call your physician if vomiting continues.  Special Instructions/Symptoms: Your throat may feel dry or sore from the anesthesia or the breathing tube placed in your throat during surgery. If this causes discomfort, gargle with warm salt water. The discomfort should disappear within 24 hours.  If you had a scopolamine patch placed  behind your ear for the management of post- operative nausea and/or vomiting:  1. The medication in the patch is effective for 72 hours, after which it should be removed.  Wrap patch in a tissue and discard in the trash. Wash hands thoroughly with soap and water. 2. You may remove the patch earlier than 72 hours if you experience unpleasant side effects which may include dry mouth, dizziness or visual disturbances. 3. Avoid touching the patch. Wash your hands with soap and water after contact with the patch.

## 2015-12-29 NOTE — Anesthesia Preprocedure Evaluation (Addendum)
Anesthesia Evaluation  Patient identified by MRN, date of birth, ID band Patient awake    Reviewed: Allergy & Precautions, NPO status   History of Anesthesia Complications (+) PONV  Airway Mallampati: II  TM Distance: >3 FB Neck ROM: Full    Dental  (+) Teeth Intact   Pulmonary neg pulmonary ROS,    breath sounds clear to auscultation       Cardiovascular hypertension,  Rhythm:Regular Rate:Normal     Neuro/Psych  Neuromuscular disease    GI/Hepatic negative GI ROS, Neg liver ROS,   Endo/Other  Morbid obesity  Renal/GU negative Renal ROS     Musculoskeletal  (+) Arthritis ,   Abdominal (+) + obese,   Peds  Hematology   Anesthesia Other Findings   Reproductive/Obstetrics                            Anesthesia Physical Anesthesia Plan  ASA: III  Anesthesia Plan: General   Post-op Pain Management:    Induction: Intravenous  Airway Management Planned: Oral ETT  Additional Equipment:   Intra-op Plan:   Post-operative Plan: Extubation in OR  Informed Consent: I have reviewed the patients History and Physical, chart, labs and discussed the procedure including the risks, benefits and alternatives for the proposed anesthesia with the patient or authorized representative who has indicated his/her understanding and acceptance.     Plan Discussed with: CRNA and Surgeon  Anesthesia Plan Comments:         Anesthesia Quick Evaluation

## 2015-12-29 NOTE — Op Note (Signed)
Preoperative diagnosis: Stage I right breast cancer  Postoperative diagnosis: Same  Procedure: Right breast seed localized partial mastectomy with right axillary sentinel lymph node mapping and placement of right internal jugular 8 French Port-A-Cath under fluoroscopic and ultrasonic guidance  Surgeon: Erroll Luna M.D.  Anesthesia: Gen. endotracheal anesthesia with right pectoral block and 0.25% Sensorcaine with epinephrine local  EBL: Less than 30 mL  Specimen: Right breast mass with clip and seed verified by radiography and 1 right axillary sentinel node pathology  Drains: None  Indications for procedure: The patient's a pleasant 40 portable female found to have a right breast mass. 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 She was seen by medical oncology and radiation oncology. Chemotherapy was recommended postoperatively due to her receptor status. She desired breast conservation and had ample breast tissue for that. She was seen in the office and the procedure was discussed with her. I discussed both lumpectomy and mastectomy and reconstruction as options as well as the need for sentinel lymph node mapping. Port-A-Cath was discussed with her for chemotherapy as well.The procedure has been discussed with the patient.  Alternative therapies have been discussed with the patient.  Operative risks include bleeding,  Infection,  Organ injury,  Nerve injury,  Blood vessel injury,  DVT,  Pulmonary embolism,  Death,  And possible reoperation.  Medical management risks include worsening of present situation.  The success of the procedure is 50 -90 % at treating patients symptoms.  The patient understands and agrees to proceed.The procedure has been discussed with the patient. Alternatives to surgery have been discussed with the patient.  Risks of surgery include bleeding,  Infection,  Seroma formation, death,  and the need for further surgery.   Pnuemothorax,  heothorax and damage to internal organs.  The patient understands and wishes to proceed.Sentinel lymph node mapping and dissection has been discussed with the patient.  Risk of bleeding,  Infection,  Seroma formation,  Additional procedures,,  Shoulder weakness ,  Shoulder stiffness,  Nerve and blood vessel injury and reaction to the mapping dyes have been discussed.  Alternatives to surgery have been discussed with the patient.  The patient agrees to proceed.  Description of procedure: The patient was met in the holding area. Neoprobe was used to verify proper seed location in the right breast. This was marked as the correct side. She underwent injection by her medicine for mapping. I discussed Port-A-Cath placement again with paranoid area and reviewed the risks and benefits of this as outlined in my history and physical. She had no questions. She was taken back to the operative room and placed supine on the OR table. After induction of general anesthesia, both arms are placed her side and the Port-A-Cath was placed first. The right chest and right neck regions were prepped and draped in a sterile fashion. Timeout was done to verify the proper patient and procedure. With the patient Trendelenburg ultrasound was used to find the right internal jugular vein. A needle was placed under ultrasound guidance into the right internal jugular vein with return of good dark nonpulsatile blood. A wire was fed through this under fluoroscopic guidance into the superior vena cava down through into the inferior vena cava. After this was done a small incision was made after removal  after removal the needle. Small incision was made at the insertion site and below that just below the right clavicle small pocket was made with an incision cautery. 8 [place] port  was brought on the field and tunneled from the lower incision to the upper incision. This was attached and flushed to the hospital. The catheter was trimmed to  18 cm. With the patient Trendelenburg the dilator introducer complex was fed over the wire moving the wire to and fro without resistance. I then removed the dilator and wire leaving the introducer in place. The cath was placed in this the peel-away sheath was peeled away without difficulty. Fluoroscopy revealed the catheter tip to be in the midst. Vena cava with no kinking. I was able to draw back on the port and get good blood return of dark. I then flushed with heparinized saline without difficulty. Hip saline lock was placed into the port which was units per cc for a total of 5 mL. Port secured to chest wall with 2-0 Prolene. Fluoroscopy was used again in the catheter looked to be in good position with no kinking. There is no obvious pneumothorax. Incisions closed with 3-0 Vicryl and 4-0 Monocryl.   The patient was reprepped and redraped. Lumpectomy on the right side was now done. Neoprobe was used to identify the hot spot which is the right breast upper outer quadrant. Curvilinear incision was made in his location and the seating clip are identified with a neoprobe and all tissue around the tumor was excised with a gross negative margin. I took additional anterior posterior margins to be sure. Cavities found to be hemostatic. Through the same incision I was able to identify the sentinel node in the right axilla. Neoprobe was changed to technetium settings from iodine settings. A hot nodes identified level I. Background counts approached 0. This is removed and sent the pathology. Hemostasis achieved. I closed the sentinel lymph node site with a deep layer of 3-0 Vicryl. I approximated the breast tissue after placing clips to mark cavity with 3-0 Vicryl. 4 Monocryl used to close the skin a subcuticular fashion. A liquid adhesive was applied. All final counts the sponge, needle and this was found to be correct this portion the case. The patient was awoke, extubated taken to recovery in satisfactory condition.

## 2015-12-29 NOTE — Progress Notes (Signed)
Nuc Med injection complete. Pt tol well.

## 2015-12-29 NOTE — Progress Notes (Signed)
Assisted Dr. Massagee with right, ultrasound guided, pectoralis block. Side rails up, monitors on throughout procedure. See vital signs in flow sheet. Tolerated Procedure well. 

## 2015-12-29 NOTE — Transfer of Care (Signed)
Immediate Anesthesia Transfer of Care Note  Patient: GRAYCIN MARCUCCI  Procedure(s) Performed: Procedure(s): RIGHT BREAST  RADIOACTIVE SEED LOCALIZATION LUMPECTOMY AND RIGHT SENTINEL LYMPH NODE MAPPING (Right) INSERTION PORT-A-CATH (N/A)  Patient Location: PACU  Anesthesia Type:GA combined with regional for post-op pain  Level of Consciousness: awake, sedated and patient cooperative  Airway & Oxygen Therapy: Patient Spontanous Breathing and Patient connected to face mask oxygen  Post-op Assessment: Report given to RN and Post -op Vital signs reviewed and stable  Post vital signs: Reviewed and stable  Last Vitals:  Filed Vitals:   12/29/15 1545 12/29/15 1759  BP:    Pulse: 100 83  Resp: 22 40    Complications: No apparent anesthesia complications

## 2015-12-29 NOTE — Anesthesia Procedure Notes (Addendum)
Anesthesia Regional Block:  Pectoralis block  Pre-Anesthetic Checklist: ,, timeout performed, Correct Patient, Correct Site, Correct Laterality, Correct Procedure, Correct Position, site marked, Risks and benefits discussed,  Surgical consent,  Pre-op evaluation,  At surgeon's request and post-op pain management  Laterality: Right and Upper  Prep: chloraprep       Needles:   Needle Type: Echogenic Stimulator Needle     Needle Length: 9cm 9 cm Needle Gauge: 22 and 22 G  Needle insertion depth: 9 cm   Additional Needles:  Procedures: ultrasound guided (picture in chart) Pectoralis block Narrative:  Start time: 12/29/2015 2:30 PM End time: 12/29/2015 2:50 PM Injection made incrementally with aspirations every 5 mL.  Performed by: Personally  Anesthesiologist: MASSAGEE, TERRY  Additional Notes: Difficult windows c [place] due to body habitus. Tolerated well   Procedure Name: Intubation Date/Time: 12/29/2015 3:59 PM Performed by: Lyndee Leo Pre-anesthesia Checklist: Patient identified, Emergency Drugs available, Suction available and Patient being monitored Patient Re-evaluated:Patient Re-evaluated prior to inductionOxygen Delivery Method: Circle System Utilized Preoxygenation: Pre-oxygenation with 100% oxygen Intubation Type: IV induction and Inhalational induction Ventilation: Mask ventilation without difficulty Laryngoscope Size: Mac and 3 Grade View: Grade II Tube type: Oral Tube size: 7.0 mm Number of attempts: 1 Airway Equipment and Method: Stylet and Oral airway Placement Confirmation: ETT inserted through vocal cords under direct vision,  positive ETCO2 and breath sounds checked- equal and bilateral Secured at: 22 cm Tube secured with: Tape Dental Injury: Teeth and Oropharynx as per pre-operative assessment

## 2015-12-29 NOTE — Progress Notes (Signed)
CXR tip in svc No obvious PTX

## 2015-12-30 NOTE — Anesthesia Postprocedure Evaluation (Signed)
Anesthesia Post Note  Patient: Vanessa [name]  Procedure(s) Performed: Procedure(s) (LRB): RIGHT BREAST  RADIOACTIVE SEED LOCALIZATION LUMPECTOMY AND RIGHT SENTINEL LYMPH NODE MAPPING (Right) INSERTION PORT-A-CATH (N/A)  Patient location during evaluation: PACU Anesthesia Type: General and Regional Level of consciousness: awake and alert Pain management: satisfactory to patient Vital Signs Assessment: post-procedure vital signs reviewed and stable Respiratory status: spontaneous breathing, nonlabored ventilation, respiratory function stable and patient connected to nasal cannula oxygen Cardiovascular status: blood pressure returned to baseline and stable Postop Assessment: no signs of nausea or vomiting Anesthetic complications: no    Last Vitals:  Filed Vitals:   12/29/15 1845 12/29/15 1930  BP: 125/86 132/76  Pulse: 78 74  Temp:  36.2 C  Resp: 16 16    Last Pain:  Filed Vitals:   12/29/15 1946  PainSc: 0-No pain                 Jaydalynn Olivero,JAMES TERRILL

## 2016-01-01 ENCOUNTER — Encounter (HOSPITAL_BASED_OUTPATIENT_CLINIC_OR_DEPARTMENT_OTHER): Payer: Self-pay | Admitting: Surgery

## 2016-01-01 ENCOUNTER — Encounter: Payer: Self-pay | Admitting: Oncology

## 2016-01-01 NOTE — Progress Notes (Signed)
Faxed [phone] forms/notes to [employer]

## 2016-01-01 NOTE — Progress Notes (Signed)
Per patient she wants mailed to her. I advised I had already faxed. All originals were mailed to her

## 2016-01-14 NOTE — Progress Notes (Signed)
[facility]  [phone] [phone]     ID: Vanessa [name] DOB: 01-24-1961  MR#: [id number]  RAX#:[id number]  Patient Care Team: Aretta Nip, MD as PCP - General (Family Medicine) Erroll Luna, MD as Consulting Physician (General Surgery) Chauncey Cruel, MD as Consulting Physician (Oncology) Marcial Pacas, MD as Consulting Physician (Neurology) Gean Birchwood, DPM as Consulting Physician (Podiatry) Servando Salina, MD as Consulting Physician (Obstetrics and Gynecology) Eppie Gibson, MD as Attending Physician (Radiation Oncology) Larey Dresser, MD as Consulting Physician (Cardiology) PCP: Aretta Nip, MD OTHER MD:  CHIEF COMPLAINT: Triple positive breast cancer  CURRENT TREATMENT: Adjuvant chemotherapy and anti-HER-2 immunotherapy   BREAST CANCER HISTORY: From the original intake note:  Vanessa [name] had screening mammography at Dr. cousins office on 11/06/2015 suggesting a change in the right breast. She was scheduled for right diagnostic mammography and tomosynthesis with ultrasonography at the [facility] 11/13/2015. The breast density was category B. In the upper outer quadrant of the right breast there was a small irregular mass with suspicious internal calcifications. By exam this was small, mobile, firm, and located at the 10:00 position 7 cm from the nipple. Ultrasound of the right breast confirmed an irregularly marginated hypoechoic mass measuring 1.1 cm. The right axilla was sonographically benign.  Biopsy of the right breast mass in question 11/15/2015 showed ([id number]) an invasive ductal carcinoma, grade 2, estrogen receptor 100% positive, progesterone receptor 2% positive, both with strong staining intensity, with an MIB-1 of 70%, and HER-2 amplification, the signals ratio being 4.92 and the number per cell 17.45.  The patient's subsequent history is as detailed below.  INTERVAL HISTORY: Vanessa [name] returns today for  follow-up of her triple positive breast cancer accompanied by her friend. Since hew last visit here she underwent genetic testing which found no evidence of a deleterious mutation. She then proceeded to Right lumpectomy and sentinel lymph node sampling 12/29/2015. The final pathology ([id number]) showed a 1.2 cm invasive ductal carcinoma, grade 3, with the single sentinel lymph node clear. Margins were negative but close (1 mm for DCIS).  Her case was represented at the breast cancer multidisciplinary conference for every 12/29/2015. At that point it was felt no further margin clearance was needed as the patient will have adjuvant chemotherapy, adjuvant anti-HER-2 treatment, and adjuvant anti-estrogens in addition to radiation.  REVIEW OF SYSTEMS: She found this seed placement the most painful portion of the procedure. Of course the sentinel lymph node process was also painful but less so, she says. She still has pain in the upper outer quadrant of the right breast and the right axilla. She was on oxycodone for this briefly then she graduated to tramadol and currently she is using gabapentin at bedtime which helps her sleep. She never developed any fever or bleeding issues. She did have significant nausea related to the anesthesia. She vomited twice after getting home the night of surgery. Incidentally she did develop significant itching when she was on OxyContin. She required Benadryl to take with that medication. The tramadol did not constipate her. A detailed review of systems today was otherwise stable  PAST MEDICAL HISTORY: Past Medical History  Diagnosis Date  . Allergy   . Arthritis   . Anemia   . Trigeminal neuralgia 05/2014  . Family history of breast cancer   . Family history of colon cancer   . Family history of thyroid cancer   . Hypertension     no meds now  .  Anxiety   . PONV (postoperative nausea and vomiting)     PAST SURGICAL HISTORY: Past Surgical History  Procedure  Laterality Date  . Breast surgery      biopsy  . Abdominal hysterectomy    . Meniscus repair      L knee  . Hysterotomy    . Eye surgery    . Breast lumpectomy with radioactive seed and sentinel lymph node biopsy Right 12/29/2015    Procedure: RIGHT BREAST  RADIOACTIVE SEED LOCALIZATION LUMPECTOMY AND RIGHT SENTINEL LYMPH NODE MAPPING;  Surgeon: Erroll Luna, MD;  Location: [facility];  Service: General;  Laterality: Right;  . Portacath placement N/A 12/29/2015    Procedure: INSERTION PORT-A-CATH;  Surgeon: Erroll Luna, MD;  Location: [facility];  Service: General;  Laterality: N/A;    FAMILY HISTORY Family History  Problem Relation Age of Onset  . Diabetes Mother   . Hypertension Mother   . Asthma Daughter   . Diabetes Sister   . Hypertension Sister   . Colon cancer Sister 45  . Thyroid cancer Sister 68  . Lung cancer Father   . Breast cancer Cousin     maternal first cousin  . Breast cancer Cousin 37    maternal first cousin - inflammatory   the patient's father died from lung cancer at the age of 48, in the setting of tobacco abuse. The patient's mother is living, at age 64. The patient had 2 brothers, 3 sisters. One sister was diagnosed with colon cancer in her 7s and later thyroid cancer. She died from metastatic disease. One brother died with pulmonary problems. On the maternal side a cousin was diagnosed with inflammatory breast cancer at the age of 23. A second cousin on the maternal side was diagnosed with breast cancer in her early 75s. There is no history of ovarian cancer in the family.  GYNECOLOGIC HISTORY:  No LMP recorded. Patient is postmenopausal. Menarche age 25, first live birth age 33, the patient is Vanessa [name] P2. She underwent simple hysterectomy with bilateral salpingectomy 06/28/2010, with benign pathology([id number]). She still has her ovaries in place. 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 She describes herself is single. At home she lives with her daughter Vanessa [name], who teaches theater at [employer] middle school and is currently studying towards a Masters in counseling; and her son Vanessa [name], who is a professional basketball player in the international circuit (currently with the [employer]). The patient has no grandchildren. She attends a Tour manager    ADVANCED DIRECTIVES: Not in place. At the initial clinic visit 01/15/2016 the patient was given the appropriate forms to complete and notarize at her discretion.  HEALTH MAINTENANCE: Social History  Substance Use Topics  . Smoking status: Never Smoker   . Smokeless tobacco: Never Used  . Alcohol Use: Yes     Comment: social     Colonoscopy: 2016/[facility]  PAP:  Bone density:  Lipid panel:  Allergies  Allergen Reactions  . Penicillins Anaphylaxis  . Cyclobenzaprine Swelling  . Hydrocodone Itching  . Meperidine Swelling  . Amoxicillin-Pot Clavulanate Rash  . Cefaclor Rash  . Codeine Rash  . Darvocet [Propoxyphene N-Acetaminophen] Rash  . Demerol Rash  . Erythromycin Rash  . Flexeril [Cyclobenzaprine Hcl] Rash  . Percocet [Oxycodone-Acetaminophen] Itching and Rash    Red streaks come up on skin  . Sulfa Antibiotics  Rash    Current Outpatient Prescriptions  Medication Sig Dispense Refill  . acetaminophen (TYLENOL) 325 MG tablet Take 650 mg by mouth every 6 (six) hours as needed. Reported on 12/18/2015    . diclofenac (VOLTAREN) 75 MG EC tablet Take 75 mg by mouth 2 (two) times daily as needed. Reported on 12/18/2015    . ergocalciferol (VITAMIN D2) 50000 units capsule Take 50,000 Units by mouth once a week.    . gabapentin (NEURONTIN) 600 MG tablet Take 1 tablet (600 mg total) by mouth 3 (three) times daily. 90 tablet 6  . lidocaine-prilocaine (EMLA) cream Apply 1 application topically as needed. 30 g 0  . Multiple  Vitamins-Minerals (CENTRUM SILVER ADULT 50+ PO) Take by mouth.    . ondansetron (ZOFRAN ODT) 4 MG disintegrating tablet Take 1 tablet (4 mg total) by mouth every 8 (eight) hours as needed for nausea or vomiting. 20 tablet 0  . traMADol (ULTRAM) 50 MG tablet Take 1 tablet (50 mg total) by mouth every 6 (six) hours as needed. 30 tablet 0   No current facility-administered medications for this visit.    OBJECTIVE: Middle-aged African-American woman in no acute distress Filed Vitals:   01/15/16 1104  BP: 166/87  Pulse: 103  Temp: 98 F (36.7 C)  Resp: 19     Body mass index is 47.66 kg/(m^2).    ECOG FS:1 - Symptomatic but completely ambulatory  Sclerae unicteric, pupils round and equal Oropharynx clear and moist-- no thrush or other lesions No cervical or supraclavicular adenopathy Lungs no rales or rhonchi Heart regular rate and rhythm Abd soft, obese, nontender, positive bowel sounds MSK no focal spinal tenderness, no upper extremity lymphedema Neuro: nonfocal, well oriented, appropriate affect Breasts: The right breast is status post recent lumpectomy. The incision is healing nicely. There is no swelling, erythema, or dehiscence. There is no unusual tenderness to palpation although the patient is a little bit tenderness laterally to the incision and in the right axilla. There is no frank numbness. The right axilla is otherwise benign. The cosmetic result is excellent. The left breast is unremarkable.    LAB RESULTS:  CMP     Component Value Date/Time   NA 141 12/20/2015 1120   NA 141 11/22/2015 1548   NA 143 04/25/2015 1148   K 3.3* 12/20/2015 1120   K 3.7 11/22/2015 1548   CL 106 12/20/2015 1120   CO2 28 12/20/2015 1120   CO2 25 11/22/2015 1548   GLUCOSE 100* 12/20/2015 1120   GLUCOSE 91 11/22/2015 1548   GLUCOSE 89 04/25/2015 1148   BUN 12 12/20/2015 1120   BUN 13.4 11/22/2015 1548   BUN 13 04/25/2015 1148   CREATININE 0.73 12/20/2015 1120   CREATININE 0.8  11/22/2015 1548   CREATININE 0.72 05/25/2014 1112   CALCIUM 9.1 12/20/2015 1120   CALCIUM 9.3 11/22/2015 1548   PROT 7.4 12/20/2015 1120   PROT 7.9 11/22/2015 1548   ALBUMIN 3.4* 12/20/2015 1120   ALBUMIN 3.6 11/22/2015 1548   AST 19 12/20/2015 1120   AST 16 11/22/2015 1548   ALT 14 12/20/2015 1120   ALT 13 11/22/2015 1548   ALKPHOS 105 12/20/2015 1120   ALKPHOS 130 11/22/2015 1548   BILITOT 0.3 12/20/2015 1120   BILITOT 0.37 11/22/2015 1548   GFRNONAA >60 12/20/2015 1120   GFRAA >60 12/20/2015 1120    INo results found for: SPEP, UPEP  Lab Results  Component Value Date   WBC 3.9* 12/20/2015   NEUTROABS 1.5*  12/20/2015   HGB 13.4 12/20/2015   HCT 41.9 12/20/2015   MCV 86.0 12/20/2015   PLT 286 12/20/2015      Chemistry      Component Value Date/Time   NA 141 12/20/2015 1120   NA 141 11/22/2015 1548   NA 143 04/25/2015 1148   K 3.3* 12/20/2015 1120   K 3.7 11/22/2015 1548   CL 106 12/20/2015 1120   CO2 28 12/20/2015 1120   CO2 25 11/22/2015 1548   BUN 12 12/20/2015 1120   BUN 13.4 11/22/2015 1548   BUN 13 04/25/2015 1148   CREATININE 0.73 12/20/2015 1120   CREATININE 0.8 11/22/2015 1548   CREATININE 0.72 05/25/2014 1112      Component Value Date/Time   CALCIUM 9.1 12/20/2015 1120   CALCIUM 9.3 11/22/2015 1548   ALKPHOS 105 12/20/2015 1120   ALKPHOS 130 11/22/2015 1548   AST 19 12/20/2015 1120   AST 16 11/22/2015 1548   ALT 14 12/20/2015 1120   ALT 13 11/22/2015 1548   BILITOT 0.3 12/20/2015 1120   BILITOT 0.37 11/22/2015 1548       No results found for: LABCA2  No components found for: LABCA125  No results for input(s): INR in the last 168 hours.  Urinalysis No results found for: COLORURINE, APPEARANCEUR, LABSPEC, PHURINE, GLUCOSEU, HGBUR, BILIRUBINUR, KETONESUR, PROTEINUR, UROBILINOGEN, NITRITE, LEUKOCYTESUR  STUDIES: Nm Sentinel Node Inj-no Rpt (breast)  12/29/2015  CLINICAL DATA: RIGHT BREAST CANCER Sulfur colloid was injected  intradermally by the nuclear medicine technologist for breast cancer sentinel node localization.   Mm Breast Surgical Specimen  12/29/2015  CLINICAL DATA:  Status post right lumpectomy after radioactive seed localization. EXAM: SPECIMEN RADIOGRAPH OF THE RIGHT BREAST COMPARISON:  Previous exam(s). FINDINGS: Status post excision of the right breast. The radioactive seed and biopsy marker clip are present, completely intact, and were marked for pathology. Locations of the seed and clip were discussed with the OR staff during the procedure. IMPRESSION: Specimen radiograph of the right breast. Electronically Signed   By: Franki Cabot M.D.   On: 12/29/2015 17:19   Dg Chest Portable 1 View  12/29/2015  CLINICAL DATA:  Status post right breast lumpectomy. Placement of central venous catheter. EXAM: PORTABLE CHEST 1 VIEW COMPARISON:  Chest radiograph 07/05/2011 FINDINGS: There has been a right internal jugular approach central venous catheter placement, tip overlies the expected location of distal superior vena cava. Cardiomediastinal silhouette is normal. Mediastinal contours appear intact. There is no evidence of focal airspace consolidation, pleural effusion or pneumothorax. Lung volumes are low. Osseous structures are without acute abnormality. Soft tissues are grossly normal. IMPRESSION: Status post placement of central venous catheter. No evidence of pneumothorax. Low lung volumes. Electronically Signed   By: Fidela Salisbury M.D.   On: 12/29/2015 20:22   Dg Fluoro Guide Cv Line-no Report  12/29/2015  CLINICAL DATA:  FLOURO GUIDE CV LINE Fluoroscopy was utilized by the requesting physician.  No radiographic interpretation.   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 The radioactive seed is approximately 5 mm  posterior to the mass/ribbon shaped tissue marker clip. Electronically Signed   By: Evangeline Dakin M.D.   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 Patient is scheduled for lumpectomy on 12/29/2015. EXAM:  MAMMOGRAPHIC GUIDED RADIOACTIVE SEED LOCALIZATION OF THE RIGHT BREAST COMPARISON:  Previous exam(s). FINDINGS: Patient presents for radioactive seed localization prior to right breast lumpectomy. I met with the patient and we discussed the procedure of seed localization including benefits and alternatives. We discussed the high likelihood of a successful procedure. We discussed the risks of the procedure including infection, bleeding, tissue injury and further surgery. We discussed the low dose of radioactivity involved in the procedure. Informed, written consent was given. The usual time-out protocol was performed immediately prior to the procedure. Using mammographic guidance, sterile technique, 1% lidocaine as local anesthetic and an I-125 radioactive seed, the mass and the ribbon shaped tissue marker clip in the upper outer quadrant of the right breast were localized using a lateral approach. The follow-up mammogram images confirm the seed in the expected location and were marked for Dr. Brantley Stage. Follow-up survey of the patient confirms the presence of the radioactive seed. Order number of I-125 seed:  [id number] Total activity:  0.257 mCi  Reference Date: 11/28/2015 The patient tolerated the procedure well and was released from the [facility]. She was given instructions regarding seed removal. IMPRESSION: Radioactive seed localization of the right breast. No apparent complications. Electronically Signed: By: Evangeline Dakin M.D. On: 12/26/2015 13:59    ASSESSMENT: 55 y.o. [place] woman status post right breast upper outer quadrant biopsy 11/15/2015 for a clinical T1c N0, stage IA invasive ductal carcinoma, grade 2,  estrogen and progesterone receptor positive, HER-2 amplified, with an MIB-1 of 70%  (1) Genetics testing 12/22/2015 through the Hereditary Gene Panel offered by [facility] found no deleterious mutations in  APC, ATM, AXIN2, BARD1, BMPR1A, BRCA1, BRCA2, BRIP1, CDH1, CDKN2A, CHEK2, DICER1, EPCAM, GREM1, KIT, MEN1, MLH1, MSH2, MSH6, MUTYH, NBN, NF1, PALB2, PDGFRA, PMS2, POLD1, POLE, PTEN, RAD50, RAD51C, RAD51D, SDHA, SDHB, SDHC, SDHD, SMAD4, SMARCA4. STK11, TP53, TSC1, TSC2, and VHL.  (2) right lumpectomy with sentinel lymph node sampling 12/29/2015 showed a pT1c pN0, stage IA invasive ductal carcinoma, grade 3, with negative though close margins (54m to DCIS)  (3) adjuvant chemotherapy with Abraxane weekly 12 with trastuzumab every 3 weeks to start 01/18/2016   (4) trastuzumab to be continued to total one year  (a) echo 12/18/2015 shows an EF of 65-70%  (5) adjuvant radiation to follow chemotherapy  (6) anti-estrogens to follow radiation  PLAN: KAlekdid well with her surgery and we spent approximately 50 minutes today going over the pathology results, the discussion at conference regarding margins, and then reviewing the possible side effects, toxicities and complications she may expect from Abraxane and trastuzumab. We also reviewed her genetics testing results.  The plan is to start these 01/18/2016. She understands she may or may not lose her hair. There are rare cases of permanent hair loss. We will be following counts on a weekly basis. The main concern with Abraxane of course his neuropathy and we will be monitoring that closely. She understands if that occurs it may be irreversible. With trastuzumab of course the concern is damage to the heart muscle, and she has a very favorable pretreatment echocardiogram. Her neuro oncologist will be Dr. MBenjamine Molaand she will be seeing him again in April with her second echocardiogram.  We reviewed how she will be taking her antinausea medication, which  will be ondansetron the evening of and morning after chemotherapy. She knows how to use the M a cream in the proper prescription was placed for her.  As far as her insomnia problems are concerned I  think she will do better with gabapentin and then tramadol, but I did refill her tramadol just in case. She will be participating in the [place] program and I really plan no limitations for her. What she is comfortable doing she should be able to do  She is not able to wear even a sports bra at present. I referring her to the "second to nature" folks so they can advisor regarding which bra may be better. She has a written prescription she can use.  Otherwise were going to see her on a weekly basis until she completes her Abraxane treatments. The trastuzumab of course we'll continue for one year, every 3 weeks.  I anticipate she will start radiation treatments mid May. She may want to "reserve" a lunchtime appointment since that would interfere the least with her work.  Otherwise she knows to call for any problems that may develop before her next visit here.  Chauncey Cruel, MD   01/15/2016 11:54 AM Medical Oncology and Hematology [facility] [address] [place], [place] [address] Tel. [phone]    Fax. [phone]

## 2016-01-15 ENCOUNTER — Ambulatory Visit (HOSPITAL_BASED_OUTPATIENT_CLINIC_OR_DEPARTMENT_OTHER): Payer: BLUE CROSS/BLUE SHIELD | Admitting: Oncology

## 2016-01-15 VITALS — BP 166/87 | HR 103 | Temp 98.0°F | Resp 19 | Ht 64.0 in | Wt 277.8 lb

## 2016-01-15 DIAGNOSIS — G629 Polyneuropathy, unspecified: Secondary | ICD-10-CM

## 2016-01-15 DIAGNOSIS — Z170 Estrogen receptor positive status [ER+]: Secondary | ICD-10-CM

## 2016-01-15 DIAGNOSIS — N644 Mastodynia: Secondary | ICD-10-CM

## 2016-01-15 DIAGNOSIS — C50411 Malignant neoplasm of upper-outer quadrant of right female breast: Secondary | ICD-10-CM

## 2016-01-15 DIAGNOSIS — G4700 Insomnia, unspecified: Secondary | ICD-10-CM | POA: Diagnosis not present

## 2016-01-15 MED ORDER — ONDANSETRON 4 MG PO TBDP: 4.0000 mg | ORAL_TABLET | Freq: Three times a day (TID) | ORAL | Status: DC | PRN

## 2016-01-15 MED ORDER — LIDOCAINE-PRILOCAINE 2.5-2.5 % EX CREA: 1.0000 "application " | TOPICAL_CREAM | CUTANEOUS | Status: DC | PRN

## 2016-01-15 MED ORDER — TRAMADOL HCL 50 MG PO TABS: 50.0000 mg | ORAL_TABLET | Freq: Four times a day (QID) | ORAL | Status: DC | PRN

## 2016-01-16 ENCOUNTER — Other Ambulatory Visit: Payer: Self-pay | Admitting: *Deleted

## 2016-01-17 ENCOUNTER — Telehealth: Payer: Self-pay | Admitting: Oncology

## 2016-01-17 NOTE — Telephone Encounter (Signed)
Patient is aware of her appointments

## 2016-01-25 ENCOUNTER — Ambulatory Visit (HOSPITAL_BASED_OUTPATIENT_CLINIC_OR_DEPARTMENT_OTHER): Payer: BLUE CROSS/BLUE SHIELD | Admitting: Nurse Practitioner

## 2016-01-25 ENCOUNTER — Other Ambulatory Visit: Payer: Self-pay | Admitting: *Deleted

## 2016-01-25 ENCOUNTER — Other Ambulatory Visit (HOSPITAL_BASED_OUTPATIENT_CLINIC_OR_DEPARTMENT_OTHER): Payer: BLUE CROSS/BLUE SHIELD

## 2016-01-25 ENCOUNTER — Other Ambulatory Visit: Payer: Self-pay

## 2016-01-25 ENCOUNTER — Encounter: Payer: Self-pay | Admitting: Nurse Practitioner

## 2016-01-25 ENCOUNTER — Ambulatory Visit (HOSPITAL_BASED_OUTPATIENT_CLINIC_OR_DEPARTMENT_OTHER): Payer: BLUE CROSS/BLUE SHIELD

## 2016-01-25 ENCOUNTER — Encounter: Payer: Self-pay | Admitting: *Deleted

## 2016-01-25 VITALS — BP 145/91 | HR 83 | Temp 97.9°F | Resp 19 | Wt 278.5 lb

## 2016-01-25 DIAGNOSIS — Z170 Estrogen receptor positive status [ER+]: Secondary | ICD-10-CM

## 2016-01-25 DIAGNOSIS — C50411 Malignant neoplasm of upper-outer quadrant of right female breast: Secondary | ICD-10-CM

## 2016-01-25 DIAGNOSIS — Z5112 Encounter for antineoplastic immunotherapy: Secondary | ICD-10-CM | POA: Diagnosis not present

## 2016-01-25 DIAGNOSIS — Z5111 Encounter for antineoplastic chemotherapy: Secondary | ICD-10-CM | POA: Diagnosis not present

## 2016-01-25 DIAGNOSIS — Z95828 Presence of other vascular implants and grafts: Secondary | ICD-10-CM

## 2016-01-25 LAB — CBC WITH DIFFERENTIAL/PLATELET
BASO%: 0.9 % (ref 0.0–2.0)
Basophils Absolute: 0 10*3/uL (ref 0.0–0.1)
EOS ABS: 0.2 10*3/uL (ref 0.0–0.5)
EOS%: 4.5 % (ref 0.0–7.0)
HEMATOCRIT: 40.9 % (ref 34.8–46.6)
HEMOGLOBIN: 13.1 g/dL (ref 11.6–15.9)
LYMPH#: 1.5 10*3/uL (ref 0.9–3.3)
LYMPH%: 39 % (ref 14.0–49.7)
MCH: 27 pg (ref 25.1–34.0)
MCHC: 32.1 g/dL (ref 31.5–36.0)
MCV: 83.9 fL (ref 79.5–101.0)
MONO#: 0.3 10*3/uL (ref 0.1–0.9)
MONO%: 7.4 % (ref 0.0–14.0)
NEUT%: 48.2 % (ref 38.4–76.8)
NEUTROS ABS: 1.9 10*3/uL (ref 1.5–6.5)
PLATELETS: 241 10*3/uL (ref 145–400)
RBC: 4.87 10*6/uL (ref 3.70–5.45)
RDW: 13.8 % (ref 11.2–14.5)
WBC: 3.9 10*3/uL (ref 3.9–10.3)

## 2016-01-25 LAB — COMPREHENSIVE METABOLIC PANEL
ALT: 12 U/L (ref 0–55)
AST: 16 U/L (ref 5–34)
Albumin: 3.5 g/dL (ref 3.5–5.0)
Alkaline Phosphatase: 128 U/L (ref 40–150)
Anion Gap: 9 mEq/L (ref 3–11)
BILIRUBIN TOTAL: 0.48 mg/dL (ref 0.20–1.20)
BUN: 15 mg/dL (ref 7.0–26.0)
CO2: 25 meq/L (ref 22–29)
Calcium: 9.1 mg/dL (ref 8.4–10.4)
Chloride: 108 mEq/L (ref 98–109)
Creatinine: 0.8 mg/dL (ref 0.6–1.1)
GLUCOSE: 112 mg/dL (ref 70–140)
Potassium: 3.5 mEq/L (ref 3.5–5.1)
SODIUM: 142 meq/L (ref 136–145)
TOTAL PROTEIN: 7.6 g/dL (ref 6.4–8.3)

## 2016-01-25 MED ORDER — SODIUM CHLORIDE 0.9 % IJ SOLN
10.0000 mL | INTRAMUSCULAR | Status: DC | PRN
  Administered 2016-01-25: 10 mL
  Filled 2016-01-25: qty 10

## 2016-01-25 MED ORDER — PACLITAXEL PROTEIN-BOUND CHEMO INJECTION 100 MG
100.0000 mg/m2 | Freq: Once | INTRAVENOUS | Status: AC
  Administered 2016-01-25: 225 mg via INTRAVENOUS
  Filled 2016-01-25: qty 45

## 2016-01-25 MED ORDER — SODIUM CHLORIDE 0.9 % IV SOLN
Freq: Once | INTRAVENOUS | Status: AC
  Administered 2016-01-25: 14:00:00 via INTRAVENOUS
  Filled 2016-01-25: qty 4

## 2016-01-25 MED ORDER — DIPHENHYDRAMINE HCL 25 MG PO CAPS
50.0000 mg | ORAL_CAPSULE | Freq: Once | ORAL | Status: AC
  Administered 2016-01-25: 50 mg via ORAL

## 2016-01-25 MED ORDER — ACETAMINOPHEN 325 MG PO TABS
650.0000 mg | ORAL_TABLET | Freq: Once | ORAL | Status: AC
  Administered 2016-01-25: 650 mg via ORAL

## 2016-01-25 MED ORDER — TRASTUZUMAB CHEMO INJECTION 440 MG
8.0000 mg/kg | Freq: Once | INTRAVENOUS | Status: AC
  Administered 2016-01-25: 1008 mg via INTRAVENOUS
  Filled 2016-01-25: qty 48

## 2016-01-25 MED ORDER — SODIUM CHLORIDE 0.9% FLUSH
10.0000 mL | INTRAVENOUS | Status: DC | PRN
  Administered 2016-01-25: 10 mL via INTRAVENOUS
  Filled 2016-01-25: qty 10

## 2016-01-25 MED ORDER — HEPARIN SOD (PORK) LOCK FLUSH 100 UNIT/ML IV SOLN
500.0000 [IU] | Freq: Once | INTRAVENOUS | Status: AC | PRN
  Administered 2016-01-25: 500 [IU]
  Filled 2016-01-25: qty 5

## 2016-01-25 MED ORDER — DIPHENHYDRAMINE HCL 25 MG PO CAPS
ORAL_CAPSULE | ORAL | Status: AC
  Filled 2016-01-25: qty 2

## 2016-01-25 MED ORDER — SODIUM CHLORIDE 0.9 % IV SOLN
Freq: Once | INTRAVENOUS | Status: AC
  Administered 2016-01-25: 11:00:00 via INTRAVENOUS

## 2016-01-25 MED ORDER — ACETAMINOPHEN 325 MG PO TABS
ORAL_TABLET | ORAL | Status: AC
  Filled 2016-01-25: qty 2

## 2016-01-25 NOTE — Progress Notes (Signed)
[place]  [phone] [phone]     ID: Vanessa [name] DOB: 1961-02-15  MR#: [id number]  CBJ#:[id number]  Patient Care Team: Aretta Nip, MD as PCP - General (Family Medicine) Erroll Luna, MD as Consulting Physician (General Surgery) Chauncey Cruel, MD as Consulting Physician (Oncology) Marcial Pacas, MD as Consulting Physician (Neurology) Gean Birchwood, DPM as Consulting Physician (Podiatry) Servando Salina, MD as Consulting Physician (Obstetrics and Gynecology) Eppie Gibson, MD as Attending Physician (Radiation Oncology) Larey Dresser, MD as Consulting Physician (Cardiology) PCP: Aretta Nip, MD OTHER MD:  CHIEF COMPLAINT: Triple positive breast cancer  CURRENT TREATMENT: Adjuvant chemotherapy and anti-HER-2 immunotherapy  BREAST CANCER HISTORY: From the original intake note:  Vanessa [name] had screening mammography at Dr. cousins office on 11/06/2015 suggesting a change in the right breast. She was scheduled for right diagnostic mammography and tomosynthesis with ultrasonography at the [facility] 11/13/2015. The breast density was category B. In the upper outer quadrant of the right breast there was a small irregular mass with suspicious internal calcifications. By exam this was small, mobile, firm, and located at the 10:00 position 7 cm from the nipple. Ultrasound of the right breast confirmed an irregularly marginated hypoechoic mass measuring 1.1 cm. The right axilla was sonographically benign.  Biopsy of the right breast mass in question 11/15/2015 showed ([id number]) an invasive ductal carcinoma, grade 2, estrogen receptor 100% positive, progesterone receptor 2% positive, both with strong staining intensity, with an MIB-1 of 70%, and HER-2 amplification, the signals ratio being 4.92 and the number per cell 17.45.  The patient's subsequent history is as detailed below.  INTERVAL HISTORY: Vanessa [name] returns today for  follow-up of her triple positive breast cancer, accompanied by her friend. She is due to start cycle 1 of 12 planned weekly doses of abraxane as well as her first cycle of trastuzumab.   REVIEW OF SYSTEMS: Vanessa [name] is nervous to start treatment today. At baseline she has few physical complaints. She denies fevers, chills, nausea, vomiting, or changes in bowel or bladder habits. She has no mouth sores, rashes, or neuropathy symptoms. She continues on gabapentin TID for her right breast pain. The incision rubs up against her bra and this is irritating. Her port site is sore. She has tramadol to take PRN. Her energy level is decent. [facility] A detailed review of systems is otherwise stable.   PAST MEDICAL HISTORY: Past Medical History  Diagnosis Date  . Allergy   . Arthritis   . Anemia   . Trigeminal neuralgia 05/2014  . Family history of breast cancer   . Family history of colon cancer   . Family history of thyroid cancer   . Hypertension     no meds now  . Anxiety   . PONV (postoperative nausea and vomiting)     PAST SURGICAL HISTORY: Past Surgical History  Procedure Laterality Date  . Breast surgery      biopsy  . Abdominal hysterectomy    . Meniscus repair      L knee  . Hysterotomy    . Eye surgery    . Breast lumpectomy with radioactive seed and sentinel lymph node biopsy Right 12/29/2015    Procedure: RIGHT BREAST  RADIOACTIVE SEED LOCALIZATION LUMPECTOMY AND RIGHT SENTINEL LYMPH NODE MAPPING;  Surgeon: Erroll Luna, MD;  Location: [facility];  Service: General;  Laterality: Right;  . Portacath placement N/A 12/29/2015  Procedure: INSERTION PORT-A-CATH;  Surgeon: Erroll Luna, MD;  Location: [facility];  Service: General;  Laterality: N/A;    FAMILY HISTORY Family History  Problem Relation Age of Onset  . Diabetes Mother   . Hypertension Mother   . Asthma Daughter   . Diabetes Sister   . Hypertension  Sister   . Colon cancer Sister 45  . Thyroid cancer Sister 21  . Lung cancer Father   . Breast cancer Cousin     maternal first cousin  . Breast cancer Cousin 62    maternal first cousin - inflammatory   the patient's father died from lung cancer at the age of 11, in the setting of tobacco abuse. The patient's mother is living, at age 47. The patient had 2 brothers, 3 sisters. One sister was diagnosed with colon cancer in her 85s and later thyroid cancer. She died from metastatic disease. One brother died with pulmonary problems. On the maternal side a cousin was diagnosed with inflammatory breast cancer at the age of 66. A second cousin on the maternal side was diagnosed with breast cancer in her early 30s. There is no history of ovarian cancer in the family.  GYNECOLOGIC HISTORY:  No LMP recorded. Patient is postmenopausal. Menarche age 47, first live birth age 35, the patient is [name]. She underwent simple hysterectomy with bilateral salpingectomy 06/28/2010, with benign pathology([id number]). She still has her ovaries in place. 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 She describes herself is single. At home she lives with her daughter Vanessa [name], who teaches theater at [name] and is currently studying towards a Masters in counseling; and her son Vanessa [name], who is a professional basketball player in the international circuit (currently with the [employer]). The patient has no grandchildren. She attends a Tour manager    ADVANCED DIRECTIVES: Not in place. At the initial clinic visit 01/25/2016 the patient was given the appropriate forms to complete and notarize at her discretion.  HEALTH MAINTENANCE: Social History  Substance Use Topics  . Smoking status: Never Smoker   . Smokeless tobacco: Never Used  . Alcohol Use: Yes     Comment:  social     Colonoscopy: 2016/Vanessa [name]  PAP:  Bone density:  Lipid panel:  Allergies  Allergen Reactions  . Penicillins Anaphylaxis  . Cyclobenzaprine Swelling  . Hydrocodone Itching  . Meperidine Swelling  . Amoxicillin-Pot Clavulanate Rash  . Cefaclor Rash  . Codeine Rash  . Darvocet [Propoxyphene N-Acetaminophen] Rash  . Demerol Rash  . Erythromycin Rash  . Flexeril [Cyclobenzaprine Hcl] Rash  . Percocet [Oxycodone-Acetaminophen] Itching and Rash    Red streaks come up on skin  . Sulfa Antibiotics Rash    Current Outpatient Prescriptions  Medication Sig Dispense Refill  . ergocalciferol (VITAMIN D2) 50000 units capsule Take 50,000 Units by mouth once a week. Pt takes Vit D2 on Wed.    Marland Kitchen gabapentin (NEURONTIN) 600 MG tablet Take 1 tablet (600 mg total) by mouth 3 (three) times daily. 90 tablet 6  . lidocaine-prilocaine (EMLA) cream Apply 1 application topically as needed. 30 g 0  . Multiple Vitamins-Minerals (CENTRUM SILVER ADULT 50+ PO) Take by mouth.    . ondansetron (ZOFRAN ODT) 4 MG disintegrating tablet Take 1 tablet (4 mg total) by mouth every 8 (eight) hours as needed for nausea or vomiting. 20 tablet 0  . acetaminophen (  TYLENOL) 325 MG tablet Take 650 mg by mouth every 6 (six) hours as needed. Reported on 01/25/2016    . diclofenac (VOLTAREN) 75 MG EC tablet Take 75 mg by mouth 2 (two) times daily as needed. Reported on 01/25/2016    . traMADol (ULTRAM) 50 MG tablet Take 1 tablet (50 mg total) by mouth every 6 (six) hours as needed. (Patient not taking: Reported on 01/25/2016) 30 tablet 0   No current facility-administered medications for this visit.    OBJECTIVE: Middle-aged African-American woman in no acute distress Filed Vitals:   01/25/16 1001  BP: 145/91  Pulse: 83  Temp: 97.9 F (36.6 C)  Resp: 19     Body mass index is 47.78 kg/(m^2).    ECOG FS:1 - Symptomatic but completely ambulatory  Skin: warm, dry  HEENT: sclerae anicteric, conjunctivae pink,  oropharynx clear. No thrush or mucositis.  Lymph Nodes: No cervical or supraclavicular lymphadenopathy  Lungs: clear to auscultation bilaterally, no rales, wheezes, or rhonci  Heart: regular rate and rhythm  Abdomen: round, soft, non tender, positive bowel sounds  Musculoskeletal: No focal spinal tenderness, no peripheral edema  Neuro: non focal, well oriented, positive affect  Breasts: deferred. Newly placed right chest port already accessed  LAB RESULTS:  CMP     Component Value Date/Time   NA 142 01/25/2016 0909   NA 141 12/20/2015 1120   NA 143 04/25/2015 1148   K 3.5 01/25/2016 0909   K 3.3* 12/20/2015 1120   CL 106 12/20/2015 1120   CO2 25 01/25/2016 0909   CO2 28 12/20/2015 1120   GLUCOSE 112 01/25/2016 0909   GLUCOSE 100* 12/20/2015 1120   GLUCOSE 89 04/25/2015 1148   BUN 15.0 01/25/2016 0909   BUN 12 12/20/2015 1120   BUN 13 04/25/2015 1148   CREATININE 0.8 01/25/2016 0909   CREATININE 0.73 12/20/2015 1120   CREATININE 0.72 05/25/2014 1112   CALCIUM 9.1 01/25/2016 0909   CALCIUM 9.1 12/20/2015 1120   PROT 7.6 01/25/2016 0909   PROT 7.4 12/20/2015 1120   ALBUMIN 3.5 01/25/2016 0909   ALBUMIN 3.4* 12/20/2015 1120   AST 16 01/25/2016 0909   AST 19 12/20/2015 1120   ALT 12 01/25/2016 0909   ALT 14 12/20/2015 1120   ALKPHOS 128 01/25/2016 0909   ALKPHOS 105 12/20/2015 1120   BILITOT 0.48 01/25/2016 0909   BILITOT 0.3 12/20/2015 1120   GFRNONAA >60 12/20/2015 1120   GFRAA >60 12/20/2015 1120    INo results found for: SPEP, UPEP  Lab Results  Component Value Date   WBC 3.9 01/25/2016   NEUTROABS 1.9 01/25/2016   HGB 13.1 01/25/2016   HCT 40.9 01/25/2016   MCV 83.9 01/25/2016   PLT 241 01/25/2016      Chemistry      Component Value Date/Time   NA 142 01/25/2016 0909   NA 141 12/20/2015 1120   NA 143 04/25/2015 1148   K 3.5 01/25/2016 0909   K 3.3* 12/20/2015 1120   CL 106 12/20/2015 1120   CO2 25 01/25/2016 0909   CO2 28 12/20/2015 1120   BUN  15.0 01/25/2016 0909   BUN 12 12/20/2015 1120   BUN 13 04/25/2015 1148   CREATININE 0.8 01/25/2016 0909   CREATININE 0.73 12/20/2015 1120   CREATININE 0.72 05/25/2014 1112      Component Value Date/Time   CALCIUM 9.1 01/25/2016 0909   CALCIUM 9.1 12/20/2015 1120   ALKPHOS 128 01/25/2016 0909   ALKPHOS 105 12/20/2015 1120  AST 16 01/25/2016 0909   AST 19 12/20/2015 1120   ALT 12 01/25/2016 0909   ALT 14 12/20/2015 1120   BILITOT 0.48 01/25/2016 0909   BILITOT 0.3 12/20/2015 1120       No results found for: LABCA2  No components found for: LABCA125  No results for input(s): INR in the last 168 hours.  Urinalysis No results found for: COLORURINE, APPEARANCEUR, LABSPEC, PHURINE, GLUCOSEU, HGBUR, BILIRUBINUR, KETONESUR, PROTEINUR, UROBILINOGEN, NITRITE, LEUKOCYTESUR  STUDIES: Nm Sentinel Node Inj-no Rpt (breast)  12/29/2015  CLINICAL DATA: RIGHT BREAST CANCER Sulfur colloid was injected intradermally by the nuclear medicine technologist for breast cancer sentinel node localization.   Mm Breast Surgical Specimen  12/29/2015  CLINICAL DATA:  Status post right lumpectomy after radioactive seed localization. EXAM: SPECIMEN RADIOGRAPH OF THE RIGHT BREAST COMPARISON:  Previous exam(s). FINDINGS: Status post excision of the right breast. The radioactive seed and biopsy marker clip are present, completely intact, and were marked for pathology. Locations of the seed and clip were discussed with the OR staff during the procedure. IMPRESSION: Specimen radiograph of the right breast. Electronically Signed   By: Franki Cabot M.D.   On: 12/29/2015 17:19   Dg Chest Portable 1 View  12/29/2015  CLINICAL DATA:  Status post right breast lumpectomy. Placement of central venous catheter. EXAM: PORTABLE CHEST 1 VIEW COMPARISON:  Chest radiograph 07/05/2011 FINDINGS: There has been a right internal jugular approach central venous catheter placement, tip overlies the expected location of distal superior  vena cava. Cardiomediastinal silhouette is normal. Mediastinal contours appear intact. There is no evidence of focal airspace consolidation, pleural effusion or pneumothorax. Lung volumes are low. Osseous structures are without acute abnormality. Soft tissues are grossly normal. IMPRESSION: Status post placement of central venous catheter. No evidence of pneumothorax. Low lung volumes. Electronically Signed   By: Fidela Salisbury M.D.   On: 12/29/2015 20:22   Dg Fluoro Guide Cv Line-no Report  12/29/2015  CLINICAL DATA:  FLOURO GUIDE CV LINE Fluoroscopy was utilized by the requesting physician.  No radiographic interpretation.   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 The radioactive seed is approximately 5 mm posterior to the mass/ribbon shaped tissue marker clip. Electronically Signed   By: Evangeline Dakin M.D.   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 Patient is scheduled for lumpectomy on 12/29/2015. EXAM: MAMMOGRAPHIC GUIDED RADIOACTIVE SEED LOCALIZATION OF THE RIGHT BREAST COMPARISON:  Previous exam(s). FINDINGS: Patient presents for radioactive seed localization prior to right breast lumpectomy. I met with the patient and we discussed the procedure of seed localization including benefits and alternatives. We discussed the high likelihood of a successful procedure. We discussed the risks of the procedure including infection, bleeding, tissue injury and further surgery. We discussed the low dose of radioactivity involved in the procedure. Informed, written consent was given. The usual time-out protocol was performed immediately prior to the procedure. Using mammographic guidance, sterile technique, 1% lidocaine as local  anesthetic and an I-125 radioactive seed, the mass and the ribbon shaped tissue marker clip in the upper outer quadrant of the right breast were localized using a lateral approach. The follow-up mammogram images confirm the seed in the expected location and were marked for Dr. Brantley Stage. Follow-up survey of the  patient confirms the presence of the radioactive seed. Order number of I-125 seed:  [id number] Total activity:  0.257 mCi  Reference Date: 11/28/2015 The patient tolerated the procedure well and was released from the [facility]. She was given instructions regarding seed removal. IMPRESSION: Radioactive seed localization of the right breast. No apparent complications. Electronically Signed: By: Evangeline Dakin M.D. On: 12/26/2015 13:59    ASSESSMENT: 55 y.o. [place] woman status post right breast upper outer quadrant biopsy 11/15/2015 for a clinical T1c N0, stage IA invasive ductal carcinoma, grade 2, estrogen and progesterone receptor positive, HER-2 amplified, with an MIB-1 of 70%  (1) Genetics testing 12/22/2015 through the Hereditary Gene Panel offered by [facility] found no deleterious mutations in  APC, ATM, AXIN2, BARD1, BMPR1A, BRCA1, BRCA2, BRIP1, CDH1, CDKN2A, CHEK2, DICER1, EPCAM, GREM1, KIT, MEN1, MLH1, MSH2, MSH6, MUTYH, NBN, NF1, PALB2, PDGFRA, PMS2, POLD1, POLE, PTEN, RAD50, RAD51C, RAD51D, SDHA, SDHB, SDHC, SDHD, SMAD4, SMARCA4. STK11, TP53, TSC1, TSC2, and VHL.  (2) right lumpectomy with sentinel lymph node sampling 12/29/2015 showed a pT1c pN0, stage IA invasive ductal carcinoma, grade 3, with negative though close margins (4m to DCIS)  (3) adjuvant chemotherapy with Abraxane weekly 12 with trastuzumab every 3 weeks to start 01/18/2016   (4) trastuzumab to be continued to total one year  (a) echo 12/18/2015 shows an EF of 65-70%  (5) adjuvant radiation to follow chemotherapy  (6) anti-estrogens to follow radiation  PLAN: KGleniceand I reviewed abraxane and trastuzumab,  including potential side effects and toxicities. She already has the zofran and understands how to use this for her nausea. The labs were reviewed in detail and were entirely normal. She will proceed with her first cycle of both drugs today.   She is going to try to find something soft to keep in between her right breast incision and her bra. GHomero Fellersis too harsh.  KAmabelwill return in 1 week for cycle 2 of abraxane. She understands and agrees with this plan. She knows the goal of treatment in her case is cure. She has been encouraged to call with any issues that might arise before her next visit here.   HLaurie Panda NP   01/25/2016 10:46 AM

## 2016-01-25 NOTE — Patient Instructions (Signed)
[facility] Discharge Instructions for Patients Receiving Chemotherapy  Today you received the following chemotherapy agents:  Abraxane and Herceptin  To help prevent nausea and vomiting after your treatment, we encourage you to take your nausea medication as ordered per MD.   If you develop nausea and vomiting that is not controlled by your nausea medication, call the clinic.   BELOW ARE SYMPTOMS THAT SHOULD BE REPORTED IMMEDIATELY:  *FEVER GREATER THAN 100.5 F  *CHILLS WITH OR WITHOUT FEVER  NAUSEA AND VOMITING THAT IS NOT CONTROLLED WITH YOUR NAUSEA MEDICATION  *UNUSUAL SHORTNESS OF BREATH  *UNUSUAL BRUISING OR BLEEDING  TENDERNESS IN MOUTH AND THROAT WITH OR WITHOUT PRESENCE OF ULCERS  *URINARY PROBLEMS  *BOWEL PROBLEMS  UNUSUAL RASH Items with * indicate a potential emergency and should be followed up as soon as possible.  Feel free to call the clinic you have any questions or concerns. The clinic phone number is [phone].  Please show the [place] [facility] at check-in to the Emergency Department and triage nurse.  Nanoparticle Albumin-Bound Paclitaxel injection What is this medicine? NANOPARTICLE ALBUMIN-BOUND PACLITAXEL (Na no PAHR ti kuhl al BYOO muhn-bound PAK li TAX el) is a chemotherapy drug. It targets fast dividing cells, like cancer cells, and causes these cells to die. This medicine is used to treat advanced breast cancer and advanced lung cancer. This medicine may be used for other purposes; ask your health care provider or pharmacist if you have questions. What should I tell my health care provider before I take this medicine? They need to know if you have any of these conditions: -kidney disease -liver disease -low blood counts, like low platelets, red blood cells, or white blood cells -recent or ongoing radiation therapy -an unusual or allergic reaction to paclitaxel, albumin, other chemotherapy, other medicines, foods, dyes, or  preservatives -pregnant or trying to get pregnant -breast-feeding How should I use this medicine? This drug is given as an infusion into a vein. It is administered in a hospital or clinic by a specially trained health care professional. Talk to your pediatrician regarding the use of this medicine in children. Special care may be needed. Overdosage: If you think you have taken too much of this medicine contact a poison control center or emergency room at once. NOTE: This medicine is only for you. Do not share this medicine with others. What if I miss a dose? It is important not to miss your dose. Call your doctor or health care professional if you are unable to keep an appointment. What may interact with this medicine? -cyclosporine -diazepam -ketoconazole -medicines to increase blood counts like filgrastim, pegfilgrastim, sargramostim -other chemotherapy drugs like cisplatin, doxorubicin, epirubicin, etoposide, teniposide, vincristine -quinidine -testosterone -vaccines -verapamil Talk to your doctor or health care professional before taking any of these medicines: -acetaminophen -aspirin -ibuprofen -ketoprofen -naproxen This list may not describe all possible interactions. Give your health care provider a list of all the medicines, herbs, non-prescription drugs, or dietary supplements you use. Also tell them if you smoke, drink alcohol, or use illegal drugs. Some items may interact with your medicine. What should I watch for while using this medicine? Your condition will be monitored carefully while you are receiving this medicine. You will need important blood work done while you are taking this medicine. This drug may make you feel generally unwell. This is not uncommon, as chemotherapy can affect healthy cells as well as cancer cells. Report any side effects. Continue your course of treatment  even though you feel ill unless your doctor tells you to stop. In some cases, you may be  given additional medicines to help with side effects. Follow all directions for their use. Call your doctor or health care professional for advice if you get a fever, chills or sore throat, or other symptoms of a cold or flu. Do not treat yourself. This drug decreases your body's ability to fight infections. Try to avoid being around people who are sick. This medicine may increase your risk to bruise or bleed. Call your doctor or health care professional if you notice any unusual bleeding. Be careful brushing and flossing your teeth or using a toothpick because you may get an infection or bleed more easily. If you have any dental work done, tell your dentist you are receiving this medicine. Avoid taking products that contain aspirin, acetaminophen, ibuprofen, naproxen, or ketoprofen unless instructed by your doctor. These medicines may hide a fever. Do not become pregnant while taking this medicine. Women should inform their doctor if they wish to become pregnant or think they might be pregnant. There is a potential for serious side effects to an unborn child. Talk to your health care professional or pharmacist for more information. Do not breast-feed an infant while taking this medicine. Men are advised not to father a child while receiving this medicine. What side effects may I notice from receiving this medicine? Side effects that you should report to your doctor or health care professional as soon as possible: -allergic reactions like skin rash, itching or hives, swelling of the face, lips, or tongue -low blood counts - This drug may decrease the number of white blood cells, red blood cells and platelets. You may be at increased risk for infections and bleeding. -signs of infection - fever or chills, cough, sore throat, pain or difficulty passing urine -signs of decreased platelets or bleeding - bruising, pinpoint red spots on the skin, black, tarry stools, nosebleeds -signs of decreased red blood  cells - unusually weak or tired, fainting spells, lightheadedness -breathing problems -changes in vision -chest pain -high or low blood pressure -mouth sores -nausea and vomiting -pain, swelling, redness or irritation at the injection site -pain, tingling, numbness in the hands or feet -slow or irregular heartbeat -swelling of the ankle, feet, hands Side effects that usually do not require medical attention (report to your doctor or health care professional if they continue or are bothersome): -aches, pains -changes in the color of fingernails -diarrhea -hair loss -loss of appetite This list may not describe all possible side effects. Call your doctor for medical advice about side effects. You may report side effects to FDA at 1-800-FDA-1088. Where should I keep my medicine? This drug is given in a hospital or clinic and will not be stored at home. NOTE: This sheet is a summary. It may not cover all possible information. If you have questions about this medicine, talk to your doctor, pharmacist, or health care provider.    2016, [employer] Standard. (2013-01-18 16:48:50) Trastuzumab injection for infusion What is this medicine? TRASTUZUMAB (tras TOO zoo mab) is a monoclonal antibody. It is used to treat breast cancer and stomach cancer. This medicine may be used for other purposes; ask your health care provider or pharmacist if you have questions. What should I tell my health care provider before I take this medicine? They need to know if you have any of these conditions: -heart disease -heart failure -infection (especially a virus infection such as chickenpox, cold  sores, or herpes) -lung or breathing disease, like asthma -recent or ongoing radiation therapy -an unusual or allergic reaction to trastuzumab, benzyl alcohol, or other medications, foods, dyes, or preservatives -pregnant or trying to get pregnant -breast-feeding How should I use this medicine? This drug is given  as an infusion into a vein. It is administered in a hospital or clinic by a specially trained health care professional. Talk to your pediatrician regarding the use of this medicine in children. This medicine is not approved for use in children. Overdosage: If you think you have taken too much of this medicine contact a poison control center or emergency room at once. NOTE: This medicine is only for you. Do not share this medicine with others. What if I miss a dose? It is important not to miss a dose. Call your doctor or health care professional if you are unable to keep an appointment. What may interact with this medicine? -doxorubicin -warfarin This list may not describe all possible interactions. Give your health care provider a list of all the medicines, herbs, non-prescription drugs, or dietary supplements you use. Also tell them if you smoke, drink alcohol, or use illegal drugs. Some items may interact with your medicine. What should I watch for while using this medicine? Visit your doctor for checks on your progress. Report any side effects. Continue your course of treatment even though you feel ill unless your doctor tells you to stop. Call your doctor or health care professional for advice if you get a fever, chills or sore throat, or other symptoms of a cold or flu. Do not treat yourself. Try to avoid being around people who are sick. You may experience fever, chills and shaking during your first infusion. These effects are usually mild and can be treated with other medicines. Report any side effects during the infusion to your health care professional. Fever and chills usually do not happen with later infusions. Do not become pregnant while taking this medicine or for 7 months after stopping it. Women should inform their doctor if they wish to become pregnant or think they might be pregnant. Women of child-bearing potential will need to have a negative pregnancy test before starting this  medicine. There is a potential for serious side effects to an unborn child. Talk to your health care professional or pharmacist for more information. Do not breast-feed an infant while taking this medicine or for 7 months after stopping it. Women must use effective birth control with this medicine. What side effects may I notice from receiving this medicine? Side effects that you should report to your doctor or other health care professional as soon as possible: -breathing difficulties -chest pain or palpitations -cough -dizziness or fainting -fever or chills, sore throat -skin rash, itching or hives -swelling of the legs or ankles -unusually weak or tired Side effects that usually do not require medical attention (report to your doctor or other health care professional if they continue or are bothersome): -loss of appetite -headache -muscle aches -nausea This list may not describe all possible side effects. Call your doctor for medical advice about side effects. You may report side effects to FDA at 1-800-FDA-1088. Where should I keep my medicine? This drug is given in a hospital or clinic and will not be stored at home. NOTE: This sheet is a summary. It may not cover all possible information. If you have questions about this medicine, talk to your doctor, pharmacist, or health care provider.    2016, [employer]  Standard. (2015-03-03 11:49:32)

## 2016-01-31 ENCOUNTER — Other Ambulatory Visit: Payer: Self-pay

## 2016-01-31 DIAGNOSIS — C50411 Malignant neoplasm of upper-outer quadrant of right female breast: Secondary | ICD-10-CM

## 2016-02-01 ENCOUNTER — Ambulatory Visit: Payer: BLUE CROSS/BLUE SHIELD

## 2016-02-01 ENCOUNTER — Encounter: Payer: Self-pay | Admitting: Nurse Practitioner

## 2016-02-01 ENCOUNTER — Other Ambulatory Visit (HOSPITAL_BASED_OUTPATIENT_CLINIC_OR_DEPARTMENT_OTHER): Payer: BLUE CROSS/BLUE SHIELD

## 2016-02-01 ENCOUNTER — Ambulatory Visit (HOSPITAL_BASED_OUTPATIENT_CLINIC_OR_DEPARTMENT_OTHER): Payer: BLUE CROSS/BLUE SHIELD

## 2016-02-01 ENCOUNTER — Ambulatory Visit (HOSPITAL_BASED_OUTPATIENT_CLINIC_OR_DEPARTMENT_OTHER): Payer: BLUE CROSS/BLUE SHIELD | Admitting: Nurse Practitioner

## 2016-02-01 ENCOUNTER — Other Ambulatory Visit: Payer: Self-pay | Admitting: *Deleted

## 2016-02-01 VITALS — BP 143/84 | HR 88 | Temp 98.0°F | Resp 20 | Wt 284.1 lb

## 2016-02-01 DIAGNOSIS — R51 Headache: Secondary | ICD-10-CM | POA: Diagnosis not present

## 2016-02-01 DIAGNOSIS — C50411 Malignant neoplasm of upper-outer quadrant of right female breast: Secondary | ICD-10-CM

## 2016-02-01 DIAGNOSIS — Z5111 Encounter for antineoplastic chemotherapy: Secondary | ICD-10-CM

## 2016-02-01 DIAGNOSIS — R0981 Nasal congestion: Secondary | ICD-10-CM | POA: Diagnosis not present

## 2016-02-01 DIAGNOSIS — Z170 Estrogen receptor positive status [ER+]: Secondary | ICD-10-CM

## 2016-02-01 DIAGNOSIS — Z95828 Presence of other vascular implants and grafts: Secondary | ICD-10-CM

## 2016-02-01 LAB — CBC WITH DIFFERENTIAL/PLATELET
BASO%: 1.3 % (ref 0.0–2.0)
BASOS ABS: 0.1 10*3/uL (ref 0.0–0.1)
EOS%: 4.5 % (ref 0.0–7.0)
Eosinophils Absolute: 0.2 10*3/uL (ref 0.0–0.5)
HEMATOCRIT: 36.3 % (ref 34.8–46.6)
HGB: 11.7 g/dL (ref 11.6–15.9)
LYMPH#: 1.7 10*3/uL (ref 0.9–3.3)
LYMPH%: 38.1 % (ref 14.0–49.7)
MCH: 27.3 pg (ref 25.1–34.0)
MCHC: 32.3 g/dL (ref 31.5–36.0)
MCV: 84.4 fL (ref 79.5–101.0)
MONO#: 0.3 10*3/uL (ref 0.1–0.9)
MONO%: 5.7 % (ref 0.0–14.0)
NEUT#: 2.3 10*3/uL (ref 1.5–6.5)
NEUT%: 50.4 % (ref 38.4–76.8)
PLATELETS: 231 10*3/uL (ref 145–400)
RBC: 4.3 10*6/uL (ref 3.70–5.45)
RDW: 13.5 % (ref 11.2–14.5)
WBC: 4.5 10*3/uL (ref 3.9–10.3)

## 2016-02-01 LAB — COMPREHENSIVE METABOLIC PANEL
ALT: 16 U/L (ref 0–55)
ANION GAP: 8 meq/L (ref 3–11)
AST: 16 U/L (ref 5–34)
Albumin: 3.3 g/dL — ABNORMAL LOW (ref 3.5–5.0)
Alkaline Phosphatase: 119 U/L (ref 40–150)
BUN: 15.1 mg/dL (ref 7.0–26.0)
CALCIUM: 8.9 mg/dL (ref 8.4–10.4)
CHLORIDE: 108 meq/L (ref 98–109)
CO2: 25 meq/L (ref 22–29)
Creatinine: 0.7 mg/dL (ref 0.6–1.1)
Glucose: 95 mg/dl (ref 70–140)
POTASSIUM: 3.8 meq/L (ref 3.5–5.1)
Sodium: 142 mEq/L (ref 136–145)
Total Bilirubin: <0.3 mg/dL (ref 0.20–1.20)
Total Protein: 7 g/dL (ref 6.4–8.3)

## 2016-02-01 MED ORDER — SODIUM CHLORIDE 0.9 % IV SOLN
Freq: Once | INTRAVENOUS | Status: AC
  Administered 2016-02-01: 10:00:00 via INTRAVENOUS
  Filled 2016-02-01: qty 4

## 2016-02-01 MED ORDER — LIDOCAINE-PRILOCAINE 2.5-2.5 % EX CREA: 1.0000 "application " | TOPICAL_CREAM | CUTANEOUS | Status: DC | PRN

## 2016-02-01 MED ORDER — PACLITAXEL PROTEIN-BOUND CHEMO INJECTION 100 MG
100.0000 mg/m2 | Freq: Once | INTRAVENOUS | Status: AC
  Administered 2016-02-01: 225 mg via INTRAVENOUS
  Filled 2016-02-01: qty 45

## 2016-02-01 MED ORDER — SODIUM CHLORIDE 0.9 % IJ SOLN
10.0000 mL | INTRAMUSCULAR | Status: DC | PRN
  Administered 2016-02-01: 10 mL
  Filled 2016-02-01: qty 10

## 2016-02-01 MED ORDER — SODIUM CHLORIDE 0.9 % IV SOLN
Freq: Once | INTRAVENOUS | Status: AC
  Administered 2016-02-01: 10:00:00 via INTRAVENOUS

## 2016-02-01 MED ORDER — HEPARIN SOD (PORK) LOCK FLUSH 100 UNIT/ML IV SOLN
500.0000 [IU] | Freq: Once | INTRAVENOUS | Status: AC | PRN
  Administered 2016-02-01: 500 [IU]
  Filled 2016-02-01: qty 5

## 2016-02-01 MED ORDER — SODIUM CHLORIDE 0.9% FLUSH
10.0000 mL | INTRAVENOUS | Status: DC | PRN
  Administered 2016-02-01: 10 mL via INTRAVENOUS
  Filled 2016-02-01: qty 10

## 2016-02-01 NOTE — Patient Instructions (Signed)
[facility] Discharge Instructions for Patients Receiving Chemotherapy  Today you received the following chemotherapy agents Abraxane To help prevent nausea and vomiting after your treatment, we encourage you to take your nausea medication as prescribed.   If you develop nausea and vomiting that is not controlled by your nausea medication, call the clinic.   BELOW ARE SYMPTOMS THAT SHOULD BE REPORTED IMMEDIATELY:  *FEVER GREATER THAN 100.5 F  *CHILLS WITH OR WITHOUT FEVER  NAUSEA AND VOMITING THAT IS NOT CONTROLLED WITH YOUR NAUSEA MEDICATION  *UNUSUAL SHORTNESS OF BREATH  *UNUSUAL BRUISING OR BLEEDING  TENDERNESS IN MOUTH AND THROAT WITH OR WITHOUT PRESENCE OF ULCERS  *URINARY PROBLEMS  *BOWEL PROBLEMS  UNUSUAL RASH Items with * indicate a potential emergency and should be followed up as soon as possible.  Feel free to call the clinic you have any questions or concerns. The clinic phone number is [phone].  Please show the CHEMO ALERT CARD at check-in to the Emergency Department and triage nurse.   

## 2016-02-01 NOTE — Progress Notes (Signed)
 [facility]  [phone] [phone]     ID: Vanessa [name] DOB: 07/20/1961  MR#: [id number]  CSN#:[id number]  Patient Care Team: Victoria R Rankins, MD as PCP - General (Family Medicine) Thomas Cornett, MD as Consulting Physician (General Surgery) Gustav C Magrinat, MD as Consulting Physician (Oncology) Yijun Yan, MD as Consulting Physician (Neurology) Richard C Tuchman, DPM as Consulting Physician (Podiatry) Sheronette Cousins, MD as Consulting Physician (Obstetrics and Gynecology) Sarah Squire, MD as Attending Physician (Radiation Oncology) Dalton S McLean, MD as Consulting Physician (Cardiology) PCP: Victoria R Rankins, MD OTHER MD:  CHIEF COMPLAINT: Triple positive breast cancer  CURRENT TREATMENT: Adjuvant chemotherapy and anti-HER-2 immunotherapy  BREAST CANCER HISTORY: From the original intake note:  Vanessa [name] had screening mammography at Dr. cousins office on 11/06/2015 suggesting a change in the right breast. She was scheduled for right diagnostic mammography and tomosynthesis with ultrasonography at the breast Center 11/13/2015. The breast density was category B. In the upper outer quadrant of the right breast there was a small irregular mass with suspicious internal calcifications. By exam this was small, mobile, firm, and located at the 10:00 position 7 cm from the nipple. Ultrasound of the right breast confirmed an irregularly marginated hypoechoic mass measuring 1.1 cm. The right axilla was sonographically benign.  Biopsy of the right breast mass in question 11/15/2015 showed ([id number]) an invasive ductal carcinoma, grade 2, estrogen receptor 100% positive, progesterone receptor 2% positive, both with strong staining intensity, with an MIB-1 of 70%, and HER-2 amplification, the signals ratio being 4.92 and the number per cell 17.45.  The patient's subsequent history is as detailed below.  INTERVAL HISTORY: Vanessa [name] returns today for  follow-up of her triple positive breast cancer, accompanied by her friend. She is due to start cycle 2 of 12 planned weekly doses of abraxane. Trastuzumab is given every 3 weeks as well, next due on 3/9.   REVIEW OF SYSTEMS: Vanessa [name] had few issues with her first treatment. She denies fevers, chills, nausea, or vomiting. She started out mildly constipation but colace got her back regular again. Her appetite is still healthy with no taste changes. She denies mouth sores, rashes, or neuropathy symptoms. Her main complaint this week is sinus congestion and headaches. Occasionally her eyes will run. She was unsure of what to take while on chemotherapy.  She continues on gabapentin TID for her right breast pain. Her energy level is decent. A detailed review of systems is otherwise stable.   PAST MEDICAL HISTORY: Past Medical History  Diagnosis Date  . Allergy   . Arthritis   . Anemia   . Trigeminal neuralgia 05/2014  . Family history of breast cancer   . Family history of colon cancer   . Family history of thyroid cancer   . Hypertension     no meds now  . Anxiety   . PONV (postoperative nausea and vomiting)     PAST SURGICAL HISTORY: Past Surgical History  Procedure Laterality Date  . Breast surgery      biopsy  . Abdominal hysterectomy    . Meniscus repair      L knee  . Hysterotomy    . Eye surgery    . Breast lumpectomy with radioactive seed and sentinel lymph node biopsy Right 12/29/2015    Procedure: RIGHT BREAST  RADIOACTIVE SEED LOCALIZATION LUMPECTOMY AND RIGHT SENTINEL LYMPH NODE MAPPING;  Surgeon: Thomas Cornett, MD;  Location: [facility];  Service: General;  Laterality: Right;  .   Portacath placement N/A 12/29/2015    Procedure: INSERTION PORT-A-CATH;  Surgeon: Thomas Cornett, MD;  Location: [facility];  Service: General;  Laterality: N/A;    FAMILY HISTORY Family History  Problem Relation Age of Onset  . Diabetes Mother   . Hypertension Mother    . Asthma Daughter   . Diabetes Sister   . Hypertension Sister   . Colon cancer Sister 47  . Thyroid cancer Sister 47  . Lung cancer Father   . Breast cancer Cousin     maternal first cousin  . Breast cancer Cousin 28    maternal first cousin - inflammatory   the patient's father died from lung cancer at the age of 42, in the setting of tobacco abuse. The patient's mother is living, at age 70. The patient had 2 brothers, 3 sisters. One sister was diagnosed with colon cancer in her 40s and later thyroid cancer. She died from metastatic disease. One brother died with pulmonary problems. On the maternal side a cousin was diagnosed with inflammatory breast cancer at the age of 28. A second cousin on the maternal side was diagnosed with breast cancer in her early 40s. There is no history of ovarian cancer in the family.  GYNECOLOGIC HISTORY:  No LMP recorded. Patient is postmenopausal. Menarche age 10, first live birth age 24, the patient is GX P2. She underwent simple hysterectomy with bilateral salpingectomy 06/28/2010, with benign pathology([id number]). She still has her ovaries in place. 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 She describes herself is single. At home she lives with her daughter Todnee Durocher, who teaches theater at [employer] and is currently studying towards a Masters in counseling; and her son Julius Brooks, who is a professional basketball player in the international circuit (currently with the [employer]). The patient has no grandchildren. She attends a nondenominational church    ADVANCED DIRECTIVES: Not in place. At the initial clinic visit 02/01/2016 the patient was given the appropriate forms to complete and notarize at her discretion.  HEALTH MAINTENANCE: Social History  Substance Use Topics  . Smoking status: Never Smoker   . Smokeless  tobacco: Never Used  . Alcohol Use: Yes     Comment: social     Colonoscopy: 2016/[facility]  PAP:  Bone density:  Lipid panel:  Allergies  Allergen Reactions  . Penicillins Anaphylaxis  . Cyclobenzaprine Swelling  . Hydrocodone Itching  . Meperidine Swelling  . Amoxicillin-Pot Clavulanate Rash  . Cefaclor Rash  . Codeine Rash  . Darvocet [Propoxyphene N-Acetaminophen] Rash  . Demerol Rash  . Erythromycin Rash  . Flexeril [Cyclobenzaprine Hcl] Rash  . Percocet [Oxycodone-Acetaminophen] Itching and Rash    Red streaks come up on skin  . Sulfa Antibiotics Rash    Current Outpatient Prescriptions  Medication Sig Dispense Refill  . diclofenac (VOLTAREN) 75 MG EC tablet Take 75 mg by mouth 2 (two) times daily as needed. Reported on 01/25/2016    . ergocalciferol (VITAMIN D2) 50000 units capsule Take 50,000 Units by mouth once a week. Pt takes Vit D2 on Wed.    . gabapentin (NEURONTIN) 600 MG tablet Take 1 tablet (600 mg total) by mouth 3 (three) times daily. 90 tablet 6  . Multiple Vitamins-Minerals (CENTRUM SILVER ADULT 50+ PO) Take by mouth.    . ondansetron (ZOFRAN ODT) 4 MG disintegrating tablet Take 1 tablet (4 mg total) by   mouth every 8 (eight) hours as needed for nausea or vomiting. 20 tablet 0  . acetaminophen (TYLENOL) 325 MG tablet Take 650 mg by mouth every 6 (six) hours as needed. Reported on 02/01/2016    . lidocaine-prilocaine (EMLA) cream Apply 1 application topically as needed. 30 g 1  . traMADol (ULTRAM) 50 MG tablet Take 1 tablet (50 mg total) by mouth every 6 (six) hours as needed. (Patient not taking: Reported on 01/25/2016) 30 tablet 0   No current facility-administered medications for this visit.    OBJECTIVE: Middle-aged African-American woman in no acute distress Filed Vitals:   02/01/16 0906  BP: 143/84  Pulse: 88  Temp: 98 F (36.7 C)  Resp: 20     Body mass index is 48.74 kg/(m^2).    ECOG FS:1 - Symptomatic but completely ambulatory  Skin: warm, dry    HEENT: sclerae anicteric, conjunctivae pink, oropharynx clear. No thrush or mucositis.  Lymph Nodes: No cervical or supraclavicular lymphadenopathy  Lungs: clear to auscultation bilaterally, no rales, wheezes, or rhonci  Heart: regular rate and rhythm  Abdomen: round, soft, non tender, positive bowel sounds  Musculoskeletal: No focal spinal tenderness, no peripheral edema  Neuro: non focal, well oriented, positive affect  Breasts: deferred. Newly placed right chest port already accessed  LAB RESULTS:  CMP     Component Value Date/Time   NA 142 01/25/2016 0909   NA 141 12/20/2015 1120   NA 143 04/25/2015 1148   K 3.5 01/25/2016 0909   K 3.3* 12/20/2015 1120   CL 106 12/20/2015 1120   CO2 25 01/25/2016 0909   CO2 28 12/20/2015 1120   GLUCOSE 112 01/25/2016 0909   GLUCOSE 100* 12/20/2015 1120   GLUCOSE 89 04/25/2015 1148   BUN 15.0 01/25/2016 0909   BUN 12 12/20/2015 1120   BUN 13 04/25/2015 1148   CREATININE 0.8 01/25/2016 0909   CREATININE 0.73 12/20/2015 1120   CREATININE 0.72 05/25/2014 1112   CALCIUM 9.1 01/25/2016 0909   CALCIUM 9.1 12/20/2015 1120   PROT 7.6 01/25/2016 0909   PROT 7.4 12/20/2015 1120   ALBUMIN 3.5 01/25/2016 0909   ALBUMIN 3.4* 12/20/2015 1120   AST 16 01/25/2016 0909   AST 19 12/20/2015 1120   ALT 12 01/25/2016 0909   ALT 14 12/20/2015 1120   ALKPHOS 128 01/25/2016 0909   ALKPHOS 105 12/20/2015 1120   BILITOT 0.48 01/25/2016 0909   BILITOT 0.3 12/20/2015 1120   GFRNONAA >60 12/20/2015 1120   GFRAA >60 12/20/2015 1120    INo results found for: SPEP, UPEP  Lab Results  Component Value Date   WBC 4.5 02/01/2016   NEUTROABS 2.3 02/01/2016   HGB 11.7 02/01/2016   HCT 36.3 02/01/2016   MCV 84.4 02/01/2016   PLT 231 02/01/2016      Chemistry      Component Value Date/Time   NA 142 01/25/2016 0909   NA 141 12/20/2015 1120   NA 143 04/25/2015 1148   K 3.5 01/25/2016 0909   K 3.3* 12/20/2015 1120   CL 106 12/20/2015 1120   CO2 25  01/25/2016 0909   CO2 28 12/20/2015 1120   BUN 15.0 01/25/2016 0909   BUN 12 12/20/2015 1120   BUN 13 04/25/2015 1148   CREATININE 0.8 01/25/2016 0909   CREATININE 0.73 12/20/2015 1120   CREATININE 0.72 05/25/2014 1112      Component Value Date/Time   CALCIUM 9.1 01/25/2016 0909   CALCIUM 9.1 12/20/2015 1120   ALKPHOS 128  01/25/2016 0909   ALKPHOS 105 12/20/2015 1120   AST 16 01/25/2016 0909   AST 19 12/20/2015 1120   ALT 12 01/25/2016 0909   ALT 14 12/20/2015 1120   BILITOT 0.48 01/25/2016 0909   BILITOT 0.3 12/20/2015 1120       No results found for: LABCA2  No components found for: LABCA125  No results for input(s): INR in the last 168 hours.  Urinalysis No results found for: COLORURINE, APPEARANCEUR, LABSPEC, PHURINE, GLUCOSEU, HGBUR, BILIRUBINUR, KETONESUR, PROTEINUR, UROBILINOGEN, NITRITE, LEUKOCYTESUR  STUDIES: No results found.  ASSESSMENT: 54 y.o. [place] woman status post right breast upper outer quadrant biopsy 11/15/2015 for a clinical T1c N0, stage IA invasive ductal carcinoma, grade 2, estrogen and progesterone receptor positive, HER-2 amplified, with an MIB-1 of 70%  (1) Genetics testing 12/22/2015 through the Hereditary Gene Panel offered by [facility] found no deleterious mutations in  APC, ATM, AXIN2, BARD1, BMPR1A, BRCA1, BRCA2, BRIP1, CDH1, CDKN2A, CHEK2, DICER1, EPCAM, GREM1, KIT, MEN1, MLH1, MSH2, MSH6, MUTYH, NBN, NF1, PALB2, PDGFRA, PMS2, POLD1, POLE, PTEN, RAD50, RAD51C, RAD51D, SDHA, SDHB, SDHC, SDHD, SMAD4, SMARCA4. STK11, TP53, TSC1, TSC2, and VHL.  (2) right lumpectomy with sentinel lymph node sampling 12/29/2015 showed a pT1c pN0, stage IA invasive ductal carcinoma, grade 3, with negative though close margins (1mm to DCIS)  (3) adjuvant chemotherapy with Abraxane weekly 12 with trastuzumab every 3 weeks to start 01/18/2016   (4) trastuzumab to be continued to total one year  (a) echo 12/18/2015 shows an EF of 65-70%  (5) adjuvant  radiation to follow chemotherapy  (6) anti-estrogens to follow radiation  PLAN: Vanessa [name] performed remarkably well with her first cycle of treatment. The labs were reviewed in detail and were entirely stable. She will proceed with cycle 2 of abraxane with no changes needed in her regimen.   For her sinus congestion and headaches, she will use the zyrtec she already has at home along with some sudafed. I suggested nasocort to help open up the sinuses but she declined, uninterested in the method of administration.   Vanessa [name] will return in 1 week for cycle 3 of abraxane. She understands and agrees with this plan. She knows the goal of treatment in her case is cure. She has been encouraged to call with any issues that might arise before her next visit here.   Vanessa F Boelter, NP   02/01/2016 9:37 AM 

## 2016-02-01 NOTE — Patient Instructions (Signed)

## 2016-02-07 ENCOUNTER — Other Ambulatory Visit: Payer: Self-pay

## 2016-02-07 DIAGNOSIS — C50411 Malignant neoplasm of upper-outer quadrant of right female breast: Secondary | ICD-10-CM

## 2016-02-08 ENCOUNTER — Ambulatory Visit (HOSPITAL_BASED_OUTPATIENT_CLINIC_OR_DEPARTMENT_OTHER): Payer: BLUE CROSS/BLUE SHIELD

## 2016-02-08 ENCOUNTER — Ambulatory Visit: Payer: Self-pay

## 2016-02-08 ENCOUNTER — Encounter: Payer: Self-pay | Admitting: Oncology

## 2016-02-08 ENCOUNTER — Ambulatory Visit (HOSPITAL_BASED_OUTPATIENT_CLINIC_OR_DEPARTMENT_OTHER): Payer: BLUE CROSS/BLUE SHIELD | Admitting: Oncology

## 2016-02-08 ENCOUNTER — Other Ambulatory Visit (HOSPITAL_BASED_OUTPATIENT_CLINIC_OR_DEPARTMENT_OTHER): Payer: BLUE CROSS/BLUE SHIELD

## 2016-02-08 VITALS — BP 159/71 | HR 104 | Temp 97.7°F | Resp 18 | Ht 64.0 in | Wt 287.0 lb

## 2016-02-08 DIAGNOSIS — C50411 Malignant neoplasm of upper-outer quadrant of right female breast: Secondary | ICD-10-CM

## 2016-02-08 DIAGNOSIS — R5383 Other fatigue: Secondary | ICD-10-CM | POA: Diagnosis not present

## 2016-02-08 DIAGNOSIS — Z170 Estrogen receptor positive status [ER+]: Secondary | ICD-10-CM | POA: Diagnosis not present

## 2016-02-08 DIAGNOSIS — Z5111 Encounter for antineoplastic chemotherapy: Secondary | ICD-10-CM | POA: Diagnosis not present

## 2016-02-08 LAB — CBC WITH DIFFERENTIAL/PLATELET
BASO%: 1 % (ref 0.0–2.0)
Basophils Absolute: 0 10*3/uL (ref 0.0–0.1)
EOS ABS: 0.1 10*3/uL (ref 0.0–0.5)
EOS%: 3.1 % (ref 0.0–7.0)
HEMATOCRIT: 35.8 % (ref 34.8–46.6)
HGB: 11.7 g/dL (ref 11.6–15.9)
LYMPH#: 1.4 10*3/uL (ref 0.9–3.3)
LYMPH%: 37.5 % (ref 14.0–49.7)
MCH: 27.4 pg (ref 25.1–34.0)
MCHC: 32.6 g/dL (ref 31.5–36.0)
MCV: 84.2 fL (ref 79.5–101.0)
MONO#: 0.2 10*3/uL (ref 0.1–0.9)
MONO%: 6.3 % (ref 0.0–14.0)
NEUT%: 52.1 % (ref 38.4–76.8)
NEUTROS ABS: 2 10*3/uL (ref 1.5–6.5)
PLATELETS: 263 10*3/uL (ref 145–400)
RBC: 4.25 10*6/uL (ref 3.70–5.45)
RDW: 13.9 % (ref 11.2–14.5)
WBC: 3.8 10*3/uL — ABNORMAL LOW (ref 3.9–10.3)

## 2016-02-08 LAB — COMPREHENSIVE METABOLIC PANEL
ALT: 19 U/L (ref 0–55)
ANION GAP: 7 meq/L (ref 3–11)
AST: 20 U/L (ref 5–34)
Albumin: 3.3 g/dL — ABNORMAL LOW (ref 3.5–5.0)
Alkaline Phosphatase: 107 U/L (ref 40–150)
BILIRUBIN TOTAL: 0.33 mg/dL (ref 0.20–1.20)
BUN: 9.8 mg/dL (ref 7.0–26.0)
CALCIUM: 8.5 mg/dL (ref 8.4–10.4)
CO2: 26 mEq/L (ref 22–29)
CREATININE: 0.7 mg/dL (ref 0.6–1.1)
Chloride: 109 mEq/L (ref 98–109)
Glucose: 101 mg/dl (ref 70–140)
Potassium: 3.6 mEq/L (ref 3.5–5.1)
Sodium: 142 mEq/L (ref 136–145)
TOTAL PROTEIN: 6.9 g/dL (ref 6.4–8.3)

## 2016-02-08 MED ORDER — SODIUM CHLORIDE 0.9 % IV SOLN
Freq: Once | INTRAVENOUS | Status: AC
  Administered 2016-02-08: 10:00:00 via INTRAVENOUS
  Filled 2016-02-08: qty 4

## 2016-02-08 MED ORDER — HEPARIN SOD (PORK) LOCK FLUSH 100 UNIT/ML IV SOLN
500.0000 [IU] | Freq: Once | INTRAVENOUS | Status: AC | PRN
  Administered 2016-02-08: 500 [IU]
  Filled 2016-02-08: qty 5

## 2016-02-08 MED ORDER — SODIUM CHLORIDE 0.9 % IJ SOLN
10.0000 mL | INTRAMUSCULAR | Status: DC | PRN
  Administered 2016-02-08: 10 mL
  Filled 2016-02-08: qty 10

## 2016-02-08 MED ORDER — SODIUM CHLORIDE 0.9 % IV SOLN
Freq: Once | INTRAVENOUS | Status: AC
Start: 2016-02-08 — End: 2016-02-08
  Administered 2016-02-08: 10:00:00 via INTRAVENOUS

## 2016-02-08 MED ORDER — PACLITAXEL PROTEIN-BOUND CHEMO INJECTION 100 MG
100.0000 mg/m2 | Freq: Once | INTRAVENOUS | Status: AC
  Administered 2016-02-08: 225 mg via INTRAVENOUS
  Filled 2016-02-08: qty 45

## 2016-02-08 NOTE — Progress Notes (Signed)
[facility]  [phone] [phone]     ID: Vanessa [name] DOB: Feb 05, 1961  MR#: [id number]  FZF#:[id number]  Patient Care Team: Clayborn Heron, MD as PCP - General (Family Medicine) Harriette Bouillon, MD as Consulting Physician (General Surgery) Lowella Dell, MD as Consulting Physician (Oncology) Levert Feinstein, MD as Consulting Physician (Neurology) Carrington Clamp, DPM as Consulting Physician (Podiatry) Maxie Better, MD as Consulting Physician (Obstetrics and Gynecology) Lonie Peak, MD as Attending Physician (Radiation Oncology) Laurey Morale, MD as Consulting Physician (Cardiology) PCP: Clayborn Heron, MD OTHER MD:  CHIEF COMPLAINT: Triple positive breast cancer  CURRENT TREATMENT:  Abraxane, trastuzumab  BREAST CANCER HISTORY: From the original intake note:  Vanessa [name] had screening mammography at Dr. cousins office on 11/06/2015 suggesting a change in the right breast. She was scheduled for right diagnostic mammography and tomosynthesis with ultrasonography at the breast Center 11/13/2015. The breast density was category B. In the upper outer quadrant of the right breast there was a small irregular mass with suspicious internal calcifications. By exam this was small, mobile, firm, and located at the 10:00 position 7 cm from the nipple. Ultrasound of the right breast confirmed an irregularly marginated hypoechoic mass measuring 1.1 cm. The right axilla was sonographically benign.  Biopsy of the right breast mass in question 11/15/2015 showed ([id number]) an invasive ductal carcinoma, grade 2, estrogen receptor 100% positive, progesterone receptor 2% positive, both with strong staining intensity, with an MIB-1 of 70%, and HER-2 amplification, the signals ratio being 4.92 and the number per cell 17.45.  The patient's subsequent history is as detailed below.  INTERVAL HISTORY: Vanessa [name] returns today for follow-up of her  HER-2 positive  breast cancer, accompanied by her friend.  Today is day 1 cycle 3 of 12 planned weekly doses of abraxane. Trastuzumab is given every 3 weeks as well, next due  With cycle 4  REVIEW OF SYSTEMS: Vanessa [name] had  More fatigued with cycle 2 than she did with cycle 1. The fatigue began on day 2 and continued through day 4. By day 5, Monday, she was able to get back to work, but she really didn't feel herself until day 7, which was yesterday. She has had some upper respiratory allergies symptoms, and she started Zyrtec for that. She gets constipated from the Abraxane but has started Senokot as needed and that takes care of the problem. She denies any numbness or tingling in her toes or fingers. She is not exercising regularly but she does walk sometimes at the wide. Her port is working well. Detailed review of systems today was otherwise noncontributory  PAST MEDICAL HISTORY: Past Medical History  Diagnosis Date  . Allergy   . Arthritis   . Anemia   . Trigeminal neuralgia 05/2014  . Family history of breast cancer   . Family history of colon cancer   . Family history of thyroid cancer   . Hypertension     no meds now  . Anxiety   . PONV (postoperative nausea and vomiting)     PAST SURGICAL HISTORY: Past Surgical History  Procedure Laterality Date  . Breast surgery      biopsy  . Abdominal hysterectomy    . Meniscus repair      L knee  . Hysterotomy    . Eye surgery    . Breast lumpectomy with radioactive seed and sentinel lymph node biopsy Right 12/29/2015    Procedure: RIGHT BREAST  RADIOACTIVE SEED LOCALIZATION LUMPECTOMY  AND RIGHT SENTINEL LYMPH NODE MAPPING;  Surgeon: Erroll Luna, MD;  Location: [facility];  Service: General;  Laterality: Right;  . Portacath placement N/A 12/29/2015    Procedure: INSERTION PORT-A-CATH;  Surgeon: Erroll Luna, MD;  Location: [facility];  Service: General;  Laterality: N/A;    FAMILY HISTORY Family History  Problem  Relation Age of Onset  . Diabetes Mother   . Hypertension Mother   . Asthma Daughter   . Diabetes Sister   . Hypertension Sister   . Colon cancer Sister 20  . Thyroid cancer Sister 100  . Lung cancer Father   . Breast cancer Cousin     maternal first cousin  . Breast cancer Cousin 27    maternal first cousin - inflammatory   the patient's father died from lung cancer at the age of 35, in the setting of tobacco abuse. The patient's mother is living, at age 89. The patient had 2 brothers, 3 sisters. One sister was diagnosed with colon cancer in her 16s and later thyroid cancer. She died from metastatic disease. One brother died with pulmonary problems. On the maternal side a cousin was diagnosed with inflammatory breast cancer at the age of 81. A second cousin on the maternal side was diagnosed with breast cancer in her early 77s. There is no history of ovarian cancer in the family.  GYNECOLOGIC HISTORY:  No LMP recorded. Patient is postmenopausal. Menarche age 23, first live birth age 82, the patient is [place] P2. She underwent simple hysterectomy with bilateral salpingectomy 06/28/2010, with benign pathology([id number]). She still has her ovaries in place. 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 She describes herself is single. At home she lives with her daughter Vanessa [name], who teaches theater at [employer] middle school and is currently studying towards a Masters in counseling; and her son Vanessa [name], who is a professional basketball player in the international circuit (currently with the [employer]). The patient has no grandchildren. She attends a Tour manager    ADVANCED DIRECTIVES: Not in place. At the initial clinic visit 02/08/2016 the patient was given the appropriate forms to complete and notarize at her discretion.  HEALTH MAINTENANCE: Social History    Substance Use Topics  . Smoking status: Never Smoker   . Smokeless tobacco: Never Used  . Alcohol Use: Yes     Comment: social     Colonoscopy: 2016/[facility]  PAP:  Bone density:  Lipid panel:  Allergies  Allergen Reactions  . Penicillins Anaphylaxis  . Cyclobenzaprine Swelling  . Hydrocodone Itching  . Meperidine Swelling  . Amoxicillin-Pot Clavulanate Rash  . Cefaclor Rash  . Codeine Rash  . Darvocet [Propoxyphene N-Acetaminophen] Rash  . Demerol Rash  . Erythromycin Rash  . Flexeril [Cyclobenzaprine Hcl] Rash  . Percocet [Oxycodone-Acetaminophen] Itching and Rash    Red streaks come up on skin  . Sulfa Antibiotics Rash    Current Outpatient Prescriptions  Medication Sig Dispense Refill  . acetaminophen (TYLENOL) 325 MG tablet Take 650 mg by mouth every 6 (six) hours as needed. Reported on 02/01/2016    . diclofenac (VOLTAREN) 75 MG EC tablet Take 75 mg by mouth 2 (two) times daily as needed. Reported on 01/25/2016    . ergocalciferol (VITAMIN D2) 50000 units capsule Take 50,000 Units by mouth once a week. Pt takes Vit D2 on Wed.    Marland Kitchen gabapentin (  NEURONTIN) 600 MG tablet Take 1 tablet (600 mg total) by mouth 3 (three) times daily. 90 tablet 6  . lidocaine-prilocaine (EMLA) cream Apply 1 application topically as needed. 30 g 1  . Multiple Vitamins-Minerals (CENTRUM SILVER ADULT 50+ PO) Take by mouth.    . ondansetron (ZOFRAN ODT) 4 MG disintegrating tablet Take 1 tablet (4 mg total) by mouth every 8 (eight) hours as needed for nausea or vomiting. 20 tablet 0  . traMADol (ULTRAM) 50 MG tablet Take 1 tablet (50 mg total) by mouth every 6 (six) hours as needed. (Patient not taking: Reported on 01/25/2016) 30 tablet 0   No current facility-administered medications for this visit.    OBJECTIVE: Middle-aged African-American woman  Filed Vitals:   02/08/16 0915  BP: 159/71  Pulse: 104  Temp: 97.7 F (36.5 C)  Resp: 18     Body mass index is 49.24 kg/(m^2).    ECOG FS:1 -  Symptomatic but completely ambulatory  Sclerae unicteric, pupils round and equal Oropharynx clear and moist-- no thrush or other lesions No cervical or supraclavicular adenopathy Lungs no rales or rhonchi Heart regular rate and rhythm Abd soft, obese, nontender, positive bowel sounds MSK no focal spinal tenderness, no upper extremity lymphedema Neuro: nonfocal, well oriented, appropriate affect Breasts:  deferred   LAB RESULTS:  CMP     Component Value Date/Time   NA 142 02/01/2016 0833   NA 141 12/20/2015 1120   NA 143 04/25/2015 1148   K 3.8 02/01/2016 0833   K 3.3* 12/20/2015 1120   CL 106 12/20/2015 1120   CO2 25 02/01/2016 0833   CO2 28 12/20/2015 1120   GLUCOSE 95 02/01/2016 0833   GLUCOSE 100* 12/20/2015 1120   GLUCOSE 89 04/25/2015 1148   BUN 15.1 02/01/2016 0833   BUN 12 12/20/2015 1120   BUN 13 04/25/2015 1148   CREATININE 0.7 02/01/2016 0833   CREATININE 0.73 12/20/2015 1120   CREATININE 0.72 05/25/2014 1112   CALCIUM 8.9 02/01/2016 0833   CALCIUM 9.1 12/20/2015 1120   PROT 7.0 02/01/2016 0833   PROT 7.4 12/20/2015 1120   ALBUMIN 3.3* 02/01/2016 0833   ALBUMIN 3.4* 12/20/2015 1120   AST 16 02/01/2016 0833   AST 19 12/20/2015 1120   ALT 16 02/01/2016 0833   ALT 14 12/20/2015 1120   ALKPHOS 119 02/01/2016 0833   ALKPHOS 105 12/20/2015 1120   BILITOT <0.30 02/01/2016 0833   BILITOT 0.3 12/20/2015 1120   GFRNONAA >60 12/20/2015 1120   GFRAA >60 12/20/2015 1120    INo results found for: SPEP, UPEP  Lab Results  Component Value Date   WBC 4.5 02/01/2016   NEUTROABS 2.3 02/01/2016   HGB 11.7 02/01/2016   HCT 36.3 02/01/2016   MCV 84.4 02/01/2016   PLT 231 02/01/2016      Chemistry      Component Value Date/Time   NA 142 02/01/2016 0833   NA 141 12/20/2015 1120   NA 143 04/25/2015 1148   K 3.8 02/01/2016 0833   K 3.3* 12/20/2015 1120   CL 106 12/20/2015 1120   CO2 25 02/01/2016 0833   CO2 28 12/20/2015 1120   BUN 15.1 02/01/2016 0833    BUN 12 12/20/2015 1120   BUN 13 04/25/2015 1148   CREATININE 0.7 02/01/2016 0833   CREATININE 0.73 12/20/2015 1120   CREATININE 0.72 05/25/2014 1112      Component Value Date/Time   CALCIUM 8.9 02/01/2016 0833   CALCIUM 9.1 12/20/2015 1120  ALKPHOS 119 02/01/2016 0833   ALKPHOS 105 12/20/2015 1120   AST 16 02/01/2016 0833   AST 19 12/20/2015 1120   ALT 16 02/01/2016 0833   ALT 14 12/20/2015 1120   BILITOT <0.30 02/01/2016 0833   BILITOT 0.3 12/20/2015 1120       No results found for: LABCA2  No components found for: [id number]  No results for input(s): INR in the last 168 hours.  Urinalysis No results found for: COLORURINE, APPEARANCEUR, LABSPEC, PHURINE, GLUCOSEU, HGBUR, BILIRUBINUR, KETONESUR, PROTEINUR, UROBILINOGEN, NITRITE, LEUKOCYTESUR  STUDIES: No results found.  ASSESSMENT: 55 y.o. [place] woman status post right breast upper outer quadrant biopsy 11/15/2015 for a clinical T1c N0, stage IA invasive ductal carcinoma, grade 2, estrogen and progesterone receptor positive, HER-2 amplified, with an MIB-1 of 70%  (1) Genetics testing 12/22/2015 through the Hereditary Gene Panel offered by [facility] found no deleterious mutations in  APC, ATM, AXIN2, BARD1, BMPR1A, BRCA1, BRCA2, BRIP1, CDH1, CDKN2A, CHEK2, DICER1, EPCAM, GREM1, KIT, MEN1, MLH1, MSH2, MSH6, MUTYH, NBN, NF1, PALB2, PDGFRA, PMS2, POLD1, POLE, PTEN, RAD50, RAD51C, RAD51D, SDHA, SDHB, SDHC, SDHD, SMAD4, SMARCA4. STK11, TP53, TSC1, TSC2, and VHL.  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 Fatigue is the main problem.  If the fatigue persists or worsens she does have the option of taking off every fourth cycle, which would be added to the and. This would make the treatments a little easier on her but it would prolong the chemotherapy. By month. She really does not want to do that. The plan accordingly remains 4 Abraxane  Weekly as before , with the trastuzumab every 3 weeks.   She is using stool softeners appropriately to take care of the mild constipation she experiences with treatment. So far she has had no peripheral neuropathy symptoms   she knows to call for any problems that may develop before her next visit here. MChauncey Cruel MD   02/08/2016 9:26 AM

## 2016-02-08 NOTE — Patient Instructions (Signed)
[facility] Discharge Instructions for Patients Receiving Chemotherapy  Today you received the following chemotherapy agents Abraxane To help prevent nausea and vomiting after your treatment, we encourage you to take your nausea medication as prescribed.   If you develop nausea and vomiting that is not controlled by your nausea medication, call the clinic.   BELOW ARE SYMPTOMS THAT SHOULD BE REPORTED IMMEDIATELY:  *FEVER GREATER THAN 100.5 F  *CHILLS WITH OR WITHOUT FEVER  NAUSEA AND VOMITING THAT IS NOT CONTROLLED WITH YOUR NAUSEA MEDICATION  *UNUSUAL SHORTNESS OF BREATH  *UNUSUAL BRUISING OR BLEEDING  TENDERNESS IN MOUTH AND THROAT WITH OR WITHOUT PRESENCE OF ULCERS  *URINARY PROBLEMS  *BOWEL PROBLEMS  UNUSUAL RASH Items with * indicate a potential emergency and should be followed up as soon as possible.  Feel free to call the clinic you have any questions or concerns. The clinic phone number is [phone].  Please show the CHEMO ALERT CARD at check-in to the Emergency Department and triage nurse.   

## 2016-02-08 NOTE — Progress Notes (Signed)
Called patient who indicated she wanted to see me today. Patient states she was ready to get home after treatment and forgot to stop by. Patient had some financial concerns as far as rent and personal bills. Discussed income information and grant information to see if patient was interested. Patient is interested. Patient states she can come meet with me tomorrow to complete grant and go over other options that may be available. Advised her about transportation grant for radiation breast patients through [place]. Will also give ACS referral,[facility]. Patient knows to bring proof of income and bills when she comes. Patient has my number for any additional questions or concerns.

## 2016-02-08 NOTE — Progress Notes (Signed)
Pt accessed by MD nurse 

## 2016-02-09 ENCOUNTER — Ambulatory Visit: Payer: BLUE CROSS/BLUE SHIELD

## 2016-02-09 ENCOUNTER — Encounter: Payer: Self-pay | Admitting: Oncology

## 2016-02-09 NOTE — Progress Notes (Signed)
Patient came in for our [place] appointment today. Patient brought in documents needed for the [employer]. Patient approved for $1000 [employer]. She has a copy of the award as well as expenses it covers. Patient gave me documents for what assistance she needed. Patient completed ACS referral. Form faxed and received per confirmation sheet. Gave her other resources such as [place] to take with her if she is interested in applying. Patient has my card for any additional questions or concerns.

## 2016-02-13 ENCOUNTER — Encounter: Payer: Self-pay | Admitting: *Deleted

## 2016-02-14 ENCOUNTER — Other Ambulatory Visit: Payer: Self-pay | Admitting: *Deleted

## 2016-02-14 DIAGNOSIS — C50411 Malignant neoplasm of upper-outer quadrant of right female breast: Secondary | ICD-10-CM

## 2016-02-15 ENCOUNTER — Ambulatory Visit: Payer: Self-pay

## 2016-02-15 ENCOUNTER — Encounter: Payer: Self-pay | Admitting: Nurse Practitioner

## 2016-02-15 ENCOUNTER — Other Ambulatory Visit (HOSPITAL_BASED_OUTPATIENT_CLINIC_OR_DEPARTMENT_OTHER): Payer: BLUE CROSS/BLUE SHIELD

## 2016-02-15 ENCOUNTER — Other Ambulatory Visit: Payer: Self-pay

## 2016-02-15 ENCOUNTER — Ambulatory Visit (HOSPITAL_BASED_OUTPATIENT_CLINIC_OR_DEPARTMENT_OTHER): Payer: BLUE CROSS/BLUE SHIELD

## 2016-02-15 ENCOUNTER — Ambulatory Visit (HOSPITAL_BASED_OUTPATIENT_CLINIC_OR_DEPARTMENT_OTHER): Payer: BLUE CROSS/BLUE SHIELD | Admitting: Nurse Practitioner

## 2016-02-15 VITALS — BP 141/72 | HR 101 | Temp 98.1°F | Resp 20 | Wt 285.3 lb

## 2016-02-15 DIAGNOSIS — Z170 Estrogen receptor positive status [ER+]: Secondary | ICD-10-CM

## 2016-02-15 DIAGNOSIS — C50411 Malignant neoplasm of upper-outer quadrant of right female breast: Secondary | ICD-10-CM

## 2016-02-15 DIAGNOSIS — G4700 Insomnia, unspecified: Secondary | ICD-10-CM | POA: Diagnosis not present

## 2016-02-15 DIAGNOSIS — Z5111 Encounter for antineoplastic chemotherapy: Secondary | ICD-10-CM

## 2016-02-15 DIAGNOSIS — Z5112 Encounter for antineoplastic immunotherapy: Secondary | ICD-10-CM

## 2016-02-15 DIAGNOSIS — Z95828 Presence of other vascular implants and grafts: Secondary | ICD-10-CM

## 2016-02-15 LAB — COMPREHENSIVE METABOLIC PANEL
ALBUMIN: 3.4 g/dL — AB (ref 3.5–5.0)
ALK PHOS: 113 U/L (ref 40–150)
ALT: 16 U/L (ref 0–55)
AST: 18 U/L (ref 5–34)
Anion Gap: 9 mEq/L (ref 3–11)
BILIRUBIN TOTAL: 0.34 mg/dL (ref 0.20–1.20)
BUN: 12.3 mg/dL (ref 7.0–26.0)
CALCIUM: 8.8 mg/dL (ref 8.4–10.4)
CO2: 25 mEq/L (ref 22–29)
Chloride: 109 mEq/L (ref 98–109)
Creatinine: 0.8 mg/dL (ref 0.6–1.1)
GLUCOSE: 116 mg/dL (ref 70–140)
Potassium: 3.7 mEq/L (ref 3.5–5.1)
SODIUM: 142 meq/L (ref 136–145)
TOTAL PROTEIN: 7.1 g/dL (ref 6.4–8.3)

## 2016-02-15 LAB — CBC WITH DIFFERENTIAL/PLATELET
BASO%: 1.1 % (ref 0.0–2.0)
BASOS ABS: 0.1 10*3/uL (ref 0.0–0.1)
EOS ABS: 0.1 10*3/uL (ref 0.0–0.5)
EOS%: 1.8 % (ref 0.0–7.0)
HCT: 36.8 % (ref 34.8–46.6)
HGB: 11.8 g/dL (ref 11.6–15.9)
LYMPH%: 32.3 % (ref 14.0–49.7)
MCH: 27.2 pg (ref 25.1–34.0)
MCHC: 32 g/dL (ref 31.5–36.0)
MCV: 84.8 fL (ref 79.5–101.0)
MONO#: 0.3 10*3/uL (ref 0.1–0.9)
MONO%: 5.6 % (ref 0.0–14.0)
NEUT%: 59.2 % (ref 38.4–76.8)
NEUTROS ABS: 3 10*3/uL (ref 1.5–6.5)
PLATELETS: 311 10*3/uL (ref 145–400)
RBC: 4.34 10*6/uL (ref 3.70–5.45)
RDW: 13.8 % (ref 11.2–14.5)
WBC: 5 10*3/uL (ref 3.9–10.3)
lymph#: 1.6 10*3/uL (ref 0.9–3.3)

## 2016-02-15 MED ORDER — PACLITAXEL PROTEIN-BOUND CHEMO INJECTION 100 MG
100.0000 mg/m2 | Freq: Once | INTRAVENOUS | Status: AC
  Administered 2016-02-15: 225 mg via INTRAVENOUS
  Filled 2016-02-15: qty 45

## 2016-02-15 MED ORDER — SODIUM CHLORIDE 0.9 % IJ SOLN
10.0000 mL | INTRAMUSCULAR | Status: DC | PRN
  Administered 2016-02-15: 10 mL
  Filled 2016-02-15: qty 10

## 2016-02-15 MED ORDER — SODIUM CHLORIDE 0.9 % IV SOLN
6.0000 mg/kg | Freq: Once | INTRAVENOUS | Status: AC
  Administered 2016-02-15: 756 mg via INTRAVENOUS
  Filled 2016-02-15: qty 36

## 2016-02-15 MED ORDER — SODIUM CHLORIDE 0.9 % IV SOLN
Freq: Once | INTRAVENOUS | Status: AC
  Administered 2016-02-15: 14:00:00 via INTRAVENOUS

## 2016-02-15 MED ORDER — ACETAMINOPHEN 325 MG PO TABS
650.0000 mg | ORAL_TABLET | Freq: Once | ORAL | Status: AC
  Administered 2016-02-15: 650 mg via ORAL

## 2016-02-15 MED ORDER — DIPHENHYDRAMINE HCL 25 MG PO CAPS
50.0000 mg | ORAL_CAPSULE | Freq: Once | ORAL | Status: AC
  Administered 2016-02-15: 50 mg via ORAL

## 2016-02-15 MED ORDER — SODIUM CHLORIDE 0.9% FLUSH
10.0000 mL | INTRAVENOUS | Status: DC | PRN
  Administered 2016-02-15: 10 mL via INTRAVENOUS
  Filled 2016-02-15: qty 10

## 2016-02-15 MED ORDER — DIPHENHYDRAMINE HCL 25 MG PO CAPS
ORAL_CAPSULE | ORAL | Status: AC
  Filled 2016-02-15: qty 2

## 2016-02-15 MED ORDER — HEPARIN SOD (PORK) LOCK FLUSH 100 UNIT/ML IV SOLN
500.0000 [IU] | Freq: Once | INTRAVENOUS | Status: AC | PRN
  Administered 2016-02-15: 500 [IU]
  Filled 2016-02-15: qty 5

## 2016-02-15 MED ORDER — SODIUM CHLORIDE 0.9 % IV SOLN
Freq: Once | INTRAVENOUS | Status: AC
  Administered 2016-02-15: 15:00:00 via INTRAVENOUS
  Filled 2016-02-15: qty 4

## 2016-02-15 MED ORDER — ACETAMINOPHEN 325 MG PO TABS
ORAL_TABLET | ORAL | Status: AC
  Filled 2016-02-15: qty 2

## 2016-02-15 NOTE — Patient Instructions (Signed)

## 2016-02-15 NOTE — Progress Notes (Signed)
Vanessa [name]  [phone] Fax:([phone]) [phone]     ID: Vanessa [name] DOB: [birth year]  MR#: [id number]  LYY#:[id number]  Patient Care Team: Aretta Nip, MD as PCP - General (Family Medicine) Erroll Luna, MD as Consulting Physician (General Surgery) Chauncey Cruel, MD as Consulting Physician (Oncology) Marcial Pacas, MD as Consulting Physician (Neurology) Gean Birchwood, DPM as Consulting Physician (Podiatry) Servando Salina, MD as Consulting Physician (Obstetrics and Gynecology) Eppie Gibson, MD as Attending Physician (Radiation Oncology) Larey Dresser, MD as Consulting Physician (Cardiology) PCP: Aretta Nip, MD OTHER MD:  CHIEF COMPLAINT: Triple positive breast cancer  CURRENT TREATMENT:  Abraxane, trastuzumab  BREAST CANCER HISTORY: From the original intake note:  Vanessa [name] had screening mammography at Dr. cousins office on 11/06/2015 suggesting a change in the right breast. She was scheduled for right diagnostic mammography and tomosynthesis with ultrasonography at the [facility] 11/13/2015. The breast density was category B. In the upper outer quadrant of the right breast there was a small irregular mass with suspicious internal calcifications. By exam this was small, mobile, firm, and located at the 10:00 position 7 cm from the nipple. Ultrasound of the right breast confirmed an irregularly marginated hypoechoic mass measuring 1.1 cm. The right axilla was sonographically benign.  Biopsy of the right breast mass in question 11/15/2015 showed ([id number]) an invasive ductal carcinoma, grade 2, estrogen receptor 100% positive, progesterone receptor 2% positive, both with strong staining intensity, with an MIB-1 of 70%, and HER-2 amplification, the signals ratio being 4.92 and the number per cell 17.45.  The patient's subsequent history is as detailed below.  INTERVAL HISTORY: Vanessa [name] returns today for follow-up of her  HER-2 positive  breast cancer, accompanied by her friend.  Today is day 1, cycle 4 of 12 planned weekly doses of abraxane. Trastuzumab is given every 3 weeks as well, next due today.  REVIEW OF SYSTEMS: Vanessa [name] had a better cycle this past week. She had less fatigue. She was able to go to work feeling more like herself. She denies mouth sores or rashes. Nausea is not an issue. She had constipation initially, but colace, increased water intake, and fiber cleared this up. Her mouth was dry on the days she forgot biotene. Her appetite is good. She denies mouth sores, rashes, or neuropathy symptoms. She is having difficulty sleeping despite '600mg'$  gabapentin QHS. A detailed review of systems is otherwise stable.  PAST MEDICAL HISTORY: Past Medical History  Diagnosis Date  . Allergy   . Arthritis   . Anemia   . Trigeminal neuralgia 05/2014  . Family history of breast cancer   . Family history of colon cancer   . Family history of thyroid cancer   . Hypertension     no meds now  . Anxiety   . PONV (postoperative nausea and vomiting)     PAST SURGICAL HISTORY: Past Surgical History  Procedure Laterality Date  . Breast surgery      biopsy  . Abdominal hysterectomy    . Meniscus repair      L knee  . Hysterotomy    . Eye surgery    . Breast lumpectomy with radioactive seed and sentinel lymph node biopsy Right 12/29/2015    Procedure: RIGHT BREAST  RADIOACTIVE SEED LOCALIZATION LUMPECTOMY AND RIGHT SENTINEL LYMPH NODE MAPPING;  Surgeon: Erroll Luna, MD;  Location: [facility];  Service: General;  Laterality: Right;  . Portacath placement N/A 12/29/2015    Procedure: INSERTION PORT-A-CATH;  Surgeon: Harriette Bouillon, MD;  Location: [facility];  Service: General;  Laterality: N/A;    FAMILY HISTORY Family History  Problem Relation Age of Onset  . Diabetes Mother   . Hypertension Mother   . Asthma Daughter   . Diabetes Sister   . Hypertension Sister   . Colon cancer Sister 49   . Thyroid cancer Sister 79  . Lung cancer Father   . Breast cancer Cousin     maternal first cousin  . Breast cancer Cousin 47    maternal first cousin - inflammatory   the patient's father died from lung cancer at the age of 10, in the setting of tobacco abuse. The patient's mother is living, at age 56. The patient had 2 brothers, 3 sisters. One sister was diagnosed with colon cancer in her 66s and later thyroid cancer. She died from metastatic disease. One brother died with pulmonary problems. On the maternal side a cousin was diagnosed with inflammatory breast cancer at the age of 61. A second cousin on the maternal side was diagnosed with breast cancer in her early 45s. There is no history of ovarian cancer in the family.  GYNECOLOGIC HISTORY:  No LMP recorded. Patient is postmenopausal. Menarche age 28, first live birth age 56, the patient is GX P2. She underwent simple hysterectomy with bilateral salpingectomy 06/28/2010, with benign pathology([id number]). She still has her ovaries in place. 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 She describes herself is single. At home she lives with her daughter Vanessa [name], who teaches theater at [employer] middle [employer] and is currently studying towards a Masters in counseling; and her son Vanessa [name], who is a professional basketball player in the international circuit (currently with the [employer]). The patient has no grandchildren. She attends a [employer]    ADVANCED DIRECTIVES: Not in place. At the initial clinic visit 02/15/2016 the patient was given the appropriate forms to complete and notarize at her discretion.  HEALTH MAINTENANCE: Social History  Substance Use Topics  . Smoking status: Never Smoker   . Smokeless tobacco: Never Used  . Alcohol Use: Yes     Comment: social     Colonoscopy:  2016/[facility]  PAP:  Bone density:  Lipid panel:  Allergies  Allergen Reactions  . Penicillins Anaphylaxis  . Cyclobenzaprine Swelling  . Hydrocodone Itching  . Meperidine Swelling  . Amoxicillin-Pot Clavulanate Rash  . Cefaclor Rash  . Codeine Rash  . Darvocet [Propoxyphene N-Acetaminophen] Rash  . Demerol Rash  . Erythromycin Rash  . Flexeril [Cyclobenzaprine Hcl] Rash  . Percocet [Oxycodone-Acetaminophen] Itching and Rash    Red streaks come up on skin  . Sulfa Antibiotics Rash    Current Outpatient Prescriptions  Medication Sig Dispense Refill  . cetirizine (ZYRTEC) 10 MG tablet Take 10 mg by mouth daily.    . ergocalciferol (VITAMIN D2) 50000 units capsule Take 50,000 Units by mouth once a week. Pt takes Vit D2 on Wed.    Marland Kitchen gabapentin (NEURONTIN) 600 MG tablet Take 1 tablet (600 mg total) by mouth 3 (three) times daily. (Patient taking differently: Take 600 mg by mouth at bedtime. ) 90 tablet 6  . lidocaine-prilocaine (EMLA) cream Apply 1 application topically as needed. 30 g 1  . Multiple Vitamins-Minerals (CENTRUM SILVER ADULT 50+ PO) Take by mouth.    . senna (SENOKOT) 8.6 MG tablet Take 1 tablet by  mouth daily.    Marland Kitchen acetaminophen (TYLENOL) 325 MG tablet Take 650 mg by mouth every 6 (six) hours as needed. Reported on 02/15/2016    . ondansetron (ZOFRAN ODT) 4 MG disintegrating tablet Take 1 tablet (4 mg total) by mouth every 8 (eight) hours as needed for nausea or vomiting. (Patient not taking: Reported on 02/15/2016) 20 tablet 0   No current facility-administered medications for this visit.    OBJECTIVE: Middle-aged African-American woman  Filed Vitals:   02/15/16 1259  BP: 141/72  Pulse: 101  Temp: 98.1 F (36.7 C)  Resp: 20     Body mass index is 48.95 kg/(m^2).    ECOG FS:1 - Symptomatic but completely ambulatory  Skin: warm, dry  HEENT: sclerae anicteric, conjunctivae pink, oropharynx clear. No thrush or mucositis.  Lymph Nodes: No cervical or supraclavicular  lymphadenopathy  Lungs: clear to auscultation bilaterally, no rales, wheezes, or rhonci  Heart: regular rate and rhythm  Abdomen: round, soft, non tender, positive bowel sounds  Musculoskeletal: No focal spinal tenderness, no peripheral edema  Neuro: non focal, well oriented, positive affect  Breasts: deferred  LAB RESULTS:  CMP     Component Value Date/Time   NA 142 02/15/2016 1209   NA 141 12/20/2015 1120   NA 143 04/25/2015 1148   K 3.7 02/15/2016 1209   K 3.3* 12/20/2015 1120   CL 106 12/20/2015 1120   CO2 25 02/15/2016 1209   CO2 28 12/20/2015 1120   GLUCOSE 116 02/15/2016 1209   GLUCOSE 100* 12/20/2015 1120   GLUCOSE 89 04/25/2015 1148   BUN 12.3 02/15/2016 1209   BUN 12 12/20/2015 1120   BUN 13 04/25/2015 1148   CREATININE 0.8 02/15/2016 1209   CREATININE 0.73 12/20/2015 1120   CREATININE 0.72 05/25/2014 1112   CALCIUM 8.8 02/15/2016 1209   CALCIUM 9.1 12/20/2015 1120   PROT 7.1 02/15/2016 1209   PROT 7.4 12/20/2015 1120   ALBUMIN 3.4* 02/15/2016 1209   ALBUMIN 3.4* 12/20/2015 1120   AST 18 02/15/2016 1209   AST 19 12/20/2015 1120   ALT 16 02/15/2016 1209   ALT 14 12/20/2015 1120   ALKPHOS 113 02/15/2016 1209   ALKPHOS 105 12/20/2015 1120   BILITOT 0.34 02/15/2016 1209   BILITOT 0.3 12/20/2015 1120   GFRNONAA >60 12/20/2015 1120   GFRAA >60 12/20/2015 1120    INo results found for: SPEP, UPEP  Lab Results  Component Value Date   WBC 5.0 02/15/2016   NEUTROABS 3.0 02/15/2016   HGB 11.8 02/15/2016   HCT 36.8 02/15/2016   MCV 84.8 02/15/2016   PLT 311 02/15/2016      Chemistry      Component Value Date/Time   NA 142 02/15/2016 1209   NA 141 12/20/2015 1120   NA 143 04/25/2015 1148   K 3.7 02/15/2016 1209   K 3.3* 12/20/2015 1120   CL 106 12/20/2015 1120   CO2 25 02/15/2016 1209   CO2 28 12/20/2015 1120   BUN 12.3 02/15/2016 1209   BUN 12 12/20/2015 1120   BUN 13 04/25/2015 1148   CREATININE 0.8 02/15/2016 1209   CREATININE 0.73 12/20/2015  1120   CREATININE 0.72 05/25/2014 1112      Component Value Date/Time   CALCIUM 8.8 02/15/2016 1209   CALCIUM 9.1 12/20/2015 1120   ALKPHOS 113 02/15/2016 1209   ALKPHOS 105 12/20/2015 1120   AST 18 02/15/2016 1209   AST 19 12/20/2015 1120   ALT 16 02/15/2016 1209   ALT  14 12/20/2015 1120   BILITOT 0.34 02/15/2016 1209   BILITOT 0.3 12/20/2015 1120       No results found for: LABCA2  No components found for: NATFT732  No results for input(s): INR in the last 168 hours.  Urinalysis No results found for: COLORURINE, APPEARANCEUR, LABSPEC, PHURINE, GLUCOSEU, HGBUR, BILIRUBINUR, KETONESUR, PROTEINUR, UROBILINOGEN, NITRITE, LEUKOCYTESUR  STUDIES: No results found.  ASSESSMENT: 56 y.o. [place] woman status post right breast upper outer quadrant biopsy 11/15/2015 for a clinical T1c N0, stage IA invasive ductal carcinoma, grade 2, estrogen and progesterone receptor positive, HER-2 amplified, with an MIB-1 of 70%  (1) Genetics testing 12/22/2015 through the Hereditary Gene Panel offered by [facility] found no deleterious mutations in  APC, ATM, AXIN2, BARD1, BMPR1A, BRCA1, BRCA2, BRIP1, CDH1, CDKN2A, CHEK2, DICER1, EPCAM, GREM1, KIT, MEN1, MLH1, MSH2, MSH6, MUTYH, NBN, NF1, PALB2, PDGFRA, PMS2, POLD1, POLE, PTEN, RAD50, RAD51C, RAD51D, SDHA, SDHB, SDHC, SDHD, SMAD4, SMARCA4. STK11, TP53, TSC1, TSC2, and VHL.  (2) right lumpectomy with sentinel lymph node sampling 12/29/2015 showed a pT1c pN0, stage IA invasive ductal carcinoma, grade 3, with negative though close margins (55m to DCIS)  (3) adjuvant chemotherapy with Abraxane weekly 12 with trastuzumab every 3 weeks to start 01/18/2016   (4) trastuzumab to be continued to total one year  (a) echo 12/18/2015 shows an EF of 65-70%  (5) adjuvant radiation to follow chemotherapy  (6) anti-estrogens to follow radiation  PLAN: KKymaniis feeling more like herself today. The labs were reviewed in detail and were entirely stable. She will  proceed with her next cycles of abraxane and trastuzumab as planned today. She is declining Dr. MVirgie Dadoffer to break her treatments up into a few weeks on and one week off.   I advised her to try melatonin in addition to the gabapentin for sleep.  KDevoniawill return in 1 week for cycle 5 of abraxane alone. She understands and agrees with this plan. She knows the goal of treatment in her case is cure. She has been encouraged to call with any issues that might arise before her next visit here.   HLaurie Panda NP   02/15/2016 1:23 PM

## 2016-02-15 NOTE — Patient Instructions (Signed)
Nanoparticle Albumin-Bound Paclitaxel injection What is this medicine? NANOPARTICLE ALBUMIN-BOUND PACLITAXEL (Na no PAHR ti kuhl al BYOO muhn-bound PAK li TAX el) is a chemotherapy drug. It targets fast dividing cells, like cancer cells, and causes these cells to die. This medicine is used to treat advanced breast cancer and advanced lung cancer. This medicine may be used for other purposes; ask your health care provider or pharmacist if you have questions. What should I tell my health care provider before I take this medicine? They need to know if you have any of these conditions: -kidney disease -liver disease -low blood counts, like low platelets, red blood cells, or white blood cells -recent or ongoing radiation therapy -an unusual or allergic reaction to paclitaxel, albumin, other chemotherapy, other medicines, foods, dyes, or preservatives -pregnant or trying to get pregnant -breast-feeding How should I use this medicine? This drug is given as an infusion into a vein. It is administered in a hospital or clinic by a specially trained health care professional. Talk to your pediatrician regarding the use of this medicine in children. Special care may be needed. Overdosage: If you think you have taken too much of this medicine contact a poison control center or emergency room at once. NOTE: This medicine is only for you. Do not share this medicine with others. What if I miss a dose? It is important not to miss your dose. Call your doctor or health care professional if you are unable to keep an appointment. What may interact with this medicine? -cyclosporine -diazepam -ketoconazole -medicines to increase blood counts like filgrastim, pegfilgrastim, sargramostim -other chemotherapy drugs like cisplatin, doxorubicin, epirubicin, etoposide, teniposide, vincristine -quinidine -testosterone -vaccines -verapamil Talk to your doctor or health care professional before taking any of these  medicines: -acetaminophen -aspirin -ibuprofen -ketoprofen -naproxen This list may not describe all possible interactions. Give your health care provider a list of all the medicines, herbs, non-prescription drugs, or dietary supplements you use. Also tell them if you smoke, drink alcohol, or use illegal drugs. Some items may interact with your medicine. What should I watch for while using this medicine? Your condition will be monitored carefully while you are receiving this medicine. You will need important blood work done while you are taking this medicine. This drug may make you feel generally unwell. This is not uncommon, as chemotherapy can affect healthy cells as well as cancer cells. Report any side effects. Continue your course of treatment even though you feel ill unless your doctor tells you to stop. In some cases, you may be given additional medicines to help with side effects. Follow all directions for their use. Call your doctor or health care professional for advice if you get a fever, chills or sore throat, or other symptoms of a cold or flu. Do not treat yourself. This drug decreases your body's ability to fight infections. Try to avoid being around people who are sick. This medicine may increase your risk to bruise or bleed. Call your doctor or health care professional if you notice any unusual bleeding. Be careful brushing and flossing your teeth or using a toothpick because you may get an infection or bleed more easily. If you have any dental work done, tell your dentist you are receiving this medicine. Avoid taking products that contain aspirin, acetaminophen, ibuprofen, naproxen, or ketoprofen unless instructed by your doctor. These medicines may hide a fever. Do not become pregnant while taking this medicine. Women should inform their doctor if they wish to become pregnant or   think they might be pregnant. There is a potential for serious side effects to an unborn child. Talk to  your health care professional or pharmacist for more information. Do not breast-feed an infant while taking this medicine. Men are advised not to father a child while receiving this medicine. What side effects may I notice from receiving this medicine? Side effects that you should report to your doctor or health care professional as soon as possible: -allergic reactions like skin rash, itching or hives, swelling of the face, lips, or tongue -low blood counts - This drug may decrease the number of white blood cells, red blood cells and platelets. You may be at increased risk for infections and bleeding. -signs of infection - fever or chills, cough, sore throat, pain or difficulty passing urine -signs of decreased platelets or bleeding - bruising, pinpoint red spots on the skin, black, tarry stools, nosebleeds -signs of decreased red blood cells - unusually weak or tired, fainting spells, lightheadedness -breathing problems -changes in vision -chest pain -high or low blood pressure -mouth sores -nausea and vomiting -pain, swelling, redness or irritation at the injection site -pain, tingling, numbness in the hands or feet -slow or irregular heartbeat -swelling of the ankle, feet, hands Side effects that usually do not require medical attention (report to your doctor or health care professional if they continue or are bothersome): -aches, pains -changes in the color of fingernails -diarrhea -hair loss -loss of appetite This list may not describe all possible side effects. Call your doctor for medical advice about side effects. You may report side effects to FDA at 1-800-FDA-1088. Where should I keep my medicine? This drug is given in a hospital or clinic and will not be stored at home. NOTE: This sheet is a summary. It may not cover all possible information. If you have questions about this medicine, talk to your doctor, pharmacist, or health care provider.    2016, Elsevier/Gold Standard.  (2013-01-18 16:48:50) Trastuzumab injection for infusion What is this medicine? TRASTUZUMAB (tras TOO zoo mab) is a monoclonal antibody. It is used to treat breast cancer and stomach cancer. This medicine may be used for other purposes; ask your health care provider or pharmacist if you have questions. What should I tell my health care provider before I take this medicine? They need to know if you have any of these conditions: -heart disease -heart failure -infection (especially a virus infection such as chickenpox, cold sores, or herpes) -lung or breathing disease, like asthma -recent or ongoing radiation therapy -an unusual or allergic reaction to trastuzumab, benzyl alcohol, or other medications, foods, dyes, or preservatives -pregnant or trying to get pregnant -breast-feeding How should I use this medicine? This drug is given as an infusion into a vein. It is administered in a hospital or clinic by a specially trained health care professional. Talk to your pediatrician regarding the use of this medicine in children. This medicine is not approved for use in children. Overdosage: If you think you have taken too much of this medicine contact a poison control center or emergency room at once. NOTE: This medicine is only for you. Do not share this medicine with others. What if I miss a dose? It is important not to miss a dose. Call your doctor or health care professional if you are unable to keep an appointment. What may interact with this medicine? -doxorubicin -warfarin This list may not describe all possible interactions. Give your health care provider a list of all the medicines,  herbs, non-prescription drugs, or dietary supplements you use. Also tell them if you smoke, drink alcohol, or use illegal drugs. Some items may interact with your medicine. What should I watch for while using this medicine? Visit your doctor for checks on your progress. Report any side effects. Continue your  course of treatment even though you feel ill unless your doctor tells you to stop. Call your doctor or health care professional for advice if you get a fever, chills or sore throat, or other symptoms of a cold or flu. Do not treat yourself. Try to avoid being around people who are sick. You may experience fever, chills and shaking during your first infusion. These effects are usually mild and can be treated with other medicines. Report any side effects during the infusion to your health care professional. Fever and chills usually do not happen with later infusions. Do not become pregnant while taking this medicine or for 7 months after stopping it. Women should inform their doctor if they wish to become pregnant or think they might be pregnant. Women of child-bearing potential will need to have a negative pregnancy test before starting this medicine. There is a potential for serious side effects to an unborn child. Talk to your health care professional or pharmacist for more information. Do not breast-feed an infant while taking this medicine or for 7 months after stopping it. Women must use effective birth control with this medicine. What side effects may I notice from receiving this medicine? Side effects that you should report to your doctor or other health care professional as soon as possible: -breathing difficulties -chest pain or palpitations -cough -dizziness or fainting -fever or chills, sore throat -skin rash, itching or hives -swelling of the legs or ankles -unusually weak or tired Side effects that usually do not require medical attention (report to your doctor or other health care professional if they continue or are bothersome): -loss of appetite -headache -muscle aches -nausea This list may not describe all possible side effects. Call your doctor for medical advice about side effects. You may report side effects to FDA at 1-800-FDA-1088. Where should I keep my medicine? This drug  is given in a hospital or clinic and will not be stored at home. NOTE: This sheet is a summary. It may not cover all possible information. If you have questions about this medicine, talk to your doctor, pharmacist, or health care provider.    2016, Elsevier/Gold Standard. (2015-03-03 11:49:32)

## 2016-02-19 ENCOUNTER — Telehealth: Payer: Self-pay | Admitting: *Deleted

## 2016-02-19 NOTE — Telephone Encounter (Signed)
Returned call to pt concerning the dosage of melatonin to use to help her sleep better. I advised her to get the 3 mg versus the 5 mg since she is already on Gabapentin. Pt also mentioned her hair had mostly fell out this weekend so she has shaved her head now. Her issues last week with the dry mouth has resolved now she has increased using the Biotene mouth rinse. No other concerns. Pt will see [place] in office on Thursday. Message to be fwd [employer].

## 2016-02-21 ENCOUNTER — Other Ambulatory Visit: Payer: Self-pay | Admitting: *Deleted

## 2016-02-21 DIAGNOSIS — C50411 Malignant neoplasm of upper-outer quadrant of right female breast: Secondary | ICD-10-CM

## 2016-02-22 ENCOUNTER — Other Ambulatory Visit: Payer: Self-pay

## 2016-02-22 ENCOUNTER — Encounter: Payer: Self-pay | Admitting: *Deleted

## 2016-02-22 ENCOUNTER — Encounter: Payer: Self-pay | Admitting: Nurse Practitioner

## 2016-02-22 ENCOUNTER — Ambulatory Visit (HOSPITAL_BASED_OUTPATIENT_CLINIC_OR_DEPARTMENT_OTHER): Payer: BLUE CROSS/BLUE SHIELD | Admitting: Nurse Practitioner

## 2016-02-22 ENCOUNTER — Other Ambulatory Visit (HOSPITAL_BASED_OUTPATIENT_CLINIC_OR_DEPARTMENT_OTHER): Payer: BLUE CROSS/BLUE SHIELD

## 2016-02-22 ENCOUNTER — Ambulatory Visit: Payer: BLUE CROSS/BLUE SHIELD

## 2016-02-22 ENCOUNTER — Ambulatory Visit (HOSPITAL_BASED_OUTPATIENT_CLINIC_OR_DEPARTMENT_OTHER): Payer: BLUE CROSS/BLUE SHIELD

## 2016-02-22 VITALS — BP 133/65 | HR 92 | Temp 97.5°F | Resp 18 | Ht 64.0 in | Wt 285.7 lb

## 2016-02-22 DIAGNOSIS — C50411 Malignant neoplasm of upper-outer quadrant of right female breast: Secondary | ICD-10-CM

## 2016-02-22 DIAGNOSIS — Z5111 Encounter for antineoplastic chemotherapy: Secondary | ICD-10-CM

## 2016-02-22 DIAGNOSIS — Z170 Estrogen receptor positive status [ER+]: Secondary | ICD-10-CM | POA: Diagnosis not present

## 2016-02-22 DIAGNOSIS — E876 Hypokalemia: Secondary | ICD-10-CM | POA: Diagnosis not present

## 2016-02-22 LAB — CBC WITH DIFFERENTIAL/PLATELET
BASO%: 0.9 % (ref 0.0–2.0)
Basophils Absolute: 0 10*3/uL (ref 0.0–0.1)
EOS ABS: 0.1 10*3/uL (ref 0.0–0.5)
EOS%: 1.5 % (ref 0.0–7.0)
HCT: 36.9 % (ref 34.8–46.6)
HGB: 11.9 g/dL (ref 11.6–15.9)
LYMPH%: 34.6 % (ref 14.0–49.7)
MCH: 27.1 pg (ref 25.1–34.0)
MCHC: 32.1 g/dL (ref 31.5–36.0)
MCV: 84.3 fL (ref 79.5–101.0)
MONO#: 0.2 10*3/uL (ref 0.1–0.9)
MONO%: 5 % (ref 0.0–14.0)
NEUT%: 58 % (ref 38.4–76.8)
NEUTROS ABS: 2.8 10*3/uL (ref 1.5–6.5)
Platelets: 277 10*3/uL (ref 145–400)
RBC: 4.37 10*6/uL (ref 3.70–5.45)
RDW: 13.8 % (ref 11.2–14.5)
WBC: 4.9 10*3/uL (ref 3.9–10.3)
lymph#: 1.7 10*3/uL (ref 0.9–3.3)

## 2016-02-22 LAB — COMPREHENSIVE METABOLIC PANEL
ALT: 20 U/L (ref 0–55)
AST: 19 U/L (ref 5–34)
Albumin: 3.3 g/dL — ABNORMAL LOW (ref 3.5–5.0)
Alkaline Phosphatase: 95 U/L (ref 40–150)
Anion Gap: 7 mEq/L (ref 3–11)
BUN: 10.5 mg/dL (ref 7.0–26.0)
CO2: 28 meq/L (ref 22–29)
Calcium: 9.1 mg/dL (ref 8.4–10.4)
Chloride: 108 mEq/L (ref 98–109)
Creatinine: 0.7 mg/dL (ref 0.6–1.1)
GLUCOSE: 109 mg/dL (ref 70–140)
POTASSIUM: 3.3 meq/L — AB (ref 3.5–5.1)
SODIUM: 143 meq/L (ref 136–145)
TOTAL PROTEIN: 6.9 g/dL (ref 6.4–8.3)
Total Bilirubin: 0.4 mg/dL (ref 0.20–1.20)

## 2016-02-22 MED ORDER — SODIUM CHLORIDE 0.9 % IJ SOLN
10.0000 mL | INTRAMUSCULAR | Status: DC | PRN
  Administered 2016-02-22: 10 mL
  Filled 2016-02-22: qty 10

## 2016-02-22 MED ORDER — PACLITAXEL PROTEIN-BOUND CHEMO INJECTION 100 MG
100.0000 mg/m2 | Freq: Once | INTRAVENOUS | Status: AC
  Administered 2016-02-22: 225 mg via INTRAVENOUS
  Filled 2016-02-22: qty 45

## 2016-02-22 MED ORDER — SODIUM CHLORIDE 0.9 % IV SOLN
Freq: Once | INTRAVENOUS | Status: AC
  Administered 2016-02-22: 14:00:00 via INTRAVENOUS
  Filled 2016-02-22: qty 4

## 2016-02-22 MED ORDER — HEPARIN SOD (PORK) LOCK FLUSH 100 UNIT/ML IV SOLN
500.0000 [IU] | Freq: Once | INTRAVENOUS | Status: AC | PRN
  Administered 2016-02-22: 500 [IU]
  Filled 2016-02-22: qty 5

## 2016-02-22 MED ORDER — SODIUM CHLORIDE 0.9 % IV SOLN
Freq: Once | INTRAVENOUS | Status: AC
  Administered 2016-02-22: 14:00:00 via INTRAVENOUS

## 2016-02-22 NOTE — Patient Instructions (Signed)

## 2016-02-22 NOTE — Progress Notes (Signed)
[place]  Telephone:(336) [phone] Fax:(336) [phone]     ID: Vanessa Vanessa [name] DOB: 10-Dec-1960  MR#: [id number]  NOM#:[id number]  Patient Care Team: Aretta Nip, MD as PCP - General (Family Medicine) Erroll Luna, MD as Consulting Physician (General Surgery) Chauncey Cruel, MD as Consulting Physician (Oncology) Marcial Pacas, MD as Consulting Physician (Neurology) Gean Birchwood, DPM as Consulting Physician (Podiatry) Servando Salina, MD as Consulting Physician (Obstetrics and Gynecology) Eppie Gibson, MD as Attending Physician (Radiation Oncology) Larey Dresser, MD as Consulting Physician (Cardiology) PCP: Aretta Nip, MD OTHER MD:  CHIEF COMPLAINT: Triple positive breast cancer  CURRENT TREATMENT:  Abraxane, trastuzumab  BREAST CANCER HISTORY: From the original intake note:  Vanessa [name] had screening mammography at Dr. cousins office on 11/06/2015 suggesting a change in the right breast. She was scheduled for right diagnostic mammography and tomosynthesis with ultrasonography at the [facility] 11/13/2015. The breast density was category B. In the upper outer quadrant of the right breast there was a small irregular mass with suspicious internal calcifications. By exam this was small, mobile, firm, and located at the 10:00 position 7 cm from the nipple. Ultrasound of the right breast confirmed an irregularly marginated hypoechoic mass measuring 1.1 cm. The right axilla was sonographically benign.  Biopsy of the right breast mass in question 11/15/2015 showed ([id number]) an invasive ductal carcinoma, grade 2, estrogen receptor 100% positive, progesterone receptor 2% positive, both with strong staining intensity, with an MIB-1 of 70%, and HER-2 amplification, the signals ratio being 4.92 and the number per cell 17.45.  The patient's subsequent history is as detailed below.  INTERVAL HISTORY: Vanessa Vanessa [name] returns today for follow-up of her  HER-2 positive  breast cancer, accompanied by a friend. Today is day 1, cycle 5 of 12 planned weekly doses of abraxane. Trastuzumab is given every 3 weeks as well, next due on 3/30.  REVIEW OF SYSTEMS: Vanessa Vanessa [name] few complaints today. She denies fevers, chills, nausea, or vomiting. She uses colace PRN for constipation. She denies mouth sores, rashes, or neuropathy symptoms. She started melatonin in addition to gabapentin for sleep, but she thinks the melatonin is making her feel weird in the morning. She is going to drop the dose from 13m to 339mnightly. She had some bloody discharge from her nose and Vanessa [name] bought a humidifier. She Vanessa [name] buzzed her hair off. A detailed review of systems is otherwise stable.  PAST MEDICAL HISTORY: Past Medical History  Diagnosis Date  . Allergy   . Arthritis   . Anemia   . Trigeminal neuralgia 05/2014  . Family history of breast cancer   . Family history of colon cancer   . Family history of thyroid cancer   . Hypertension     no meds now  . Anxiety   . PONV (postoperative nausea and vomiting)     PAST SURGICAL HISTORY: Past Surgical History  Procedure Laterality Date  . Breast surgery      biopsy  . Abdominal hysterectomy    . Meniscus repair      L knee  . Hysterotomy    . Eye surgery    . Breast lumpectomy with radioactive seed and sentinel lymph node biopsy Right 12/29/2015    Procedure: RIGHT BREAST  RADIOACTIVE SEED LOCALIZATION LUMPECTOMY AND RIGHT SENTINEL LYMPH NODE MAPPING;  Surgeon: ThErroll LunaMD;  Location: [facility] Service: General;  Laterality: Right;  . Portacath placement N/A 12/29/2015    Procedure: INSERTION PORT-A-CATH;  Surgeon: Erroll Luna, MD;  Location: [place];  Service: General;  Laterality: N/A;    FAMILY HISTORY Family History  Problem Relation Age of Onset  . Diabetes Mother   . Hypertension Mother   . Asthma Daughter   . Diabetes Sister   . Hypertension Sister   . Colon cancer Sister 72    . Thyroid cancer Sister 32  . Lung cancer Father   . Breast cancer Cousin     maternal first cousin  . Breast cancer Cousin 37    maternal first cousin - inflammatory   the patient's father died from lung cancer at the age of 31, in the setting of tobacco abuse. The patient's mother is living, at age 64. The patient had 2 brothers, 3 sisters. One sister was diagnosed with colon cancer in her 19s and later thyroid cancer. She died from metastatic disease. One brother died with pulmonary problems. On the maternal side a cousin was diagnosed with inflammatory breast cancer at the age of 52. A second cousin on the maternal side was diagnosed with breast cancer in her early 6s. There is no history of ovarian cancer in the family.  GYNECOLOGIC HISTORY:  No LMP recorded. Patient is postmenopausal. Menarche age 43, first live birth age 38, the patient is Vanessa Vanessa [name] P2. She underwent simple hysterectomy with bilateral salpingectomy 06/28/2010, with benign pathology([id number]). She still Vanessa [name] her ovaries in place. 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 She describes herself is single. At home she lives with her daughter Vanessa Vanessa [name], who teaches theater at [employer] middle school and is currently studying towards a Masters in counseling; and her son Vanessa Vanessa [name], who is a professional basketball player in the international circuit (currently with the [employer]). The patient Vanessa [name] no grandchildren. She attends a Tour manager    ADVANCED DIRECTIVES: Not in place. At the initial clinic visit 02/22/2016 the patient was given the appropriate forms to complete and notarize at her discretion.  HEALTH MAINTENANCE: Social History  Substance Use Topics  . Smoking status: Never Smoker   . Smokeless tobacco: Never Used  . Alcohol Use: Yes     Comment: social     Colonoscopy:  2016/[facility]  PAP:  Bone density:  Lipid panel:  Allergies  Allergen Reactions  . Penicillins Anaphylaxis  . Cyclobenzaprine Swelling  . Hydrocodone Itching  . Meperidine Swelling  . Amoxicillin-Pot Clavulanate Rash  . Cefaclor Rash  . Codeine Rash  . Darvocet [Propoxyphene N-Acetaminophen] Rash  . Demerol Rash  . Erythromycin Rash  . Flexeril [Cyclobenzaprine Hcl] Rash  . Percocet [Oxycodone-Acetaminophen] Itching and Rash    Red streaks come up on skin  . Sulfa Antibiotics Rash    Current Outpatient Prescriptions  Medication Sig Dispense Refill  . acetaminophen (TYLENOL) 325 MG tablet Take 650 mg by mouth every 6 (six) hours as needed. Reported on 02/15/2016    . cetirizine (ZYRTEC) 10 MG tablet Take 10 mg by mouth daily.    Mariane Baumgarten Sodium (COLACE PO) Take 1 tablet by mouth daily.    . ergocalciferol (VITAMIN D2) 50000 units capsule Take 50,000 Units by mouth once a week. Pt takes Vit D2 on Wed.    Marland Kitchen gabapentin (NEURONTIN) 600 MG tablet Take 1 tablet (600 mg total) by mouth 3 (three) times daily. (Patient taking differently: Take 600 mg by mouth at bedtime. ) 90 tablet 6  .  lidocaine-prilocaine (EMLA) cream Apply 1 application topically as needed. 30 g 1  . Multiple Vitamins-Minerals (CENTRUM SILVER ADULT 50+ PO) Take by mouth.    . ondansetron (ZOFRAN ODT) 4 MG disintegrating tablet Take 1 tablet (4 mg total) by mouth every 8 (eight) hours as needed for nausea or vomiting. (Patient not taking: Reported on 02/15/2016) 20 tablet 0  . senna (SENOKOT) 8.6 MG tablet Take 1 tablet by mouth daily. Reported on 02/22/2016     No current facility-administered medications for this visit.    OBJECTIVE: Middle-aged African-American woman  Filed Vitals:   02/22/16 1315  BP: 133/65  Pulse: 92  Temp: 97.5 F (36.4 C)  Resp: 18     Body mass index is 49.02 kg/(m^2).    ECOG FS:1 - Symptomatic but completely ambulatory  Sclerae unicteric, pupils round and equal Oropharynx clear and  moist-- no thrush or other lesions No cervical or supraclavicular adenopathy Lungs no rales or rhonchi Heart regular rate and rhythm Abd soft, nontender, positive bowel sounds MSK no focal spinal tenderness, no upper extremity lymphedema Neuro: nonfocal, well oriented, appropriate affect Breasts: deferred  LAB RESULTS:  CMP     Component Value Date/Time   NA 143 02/22/2016 1153   NA 141 12/20/2015 1120   NA 143 04/25/2015 1148   K 3.3* 02/22/2016 1153   K 3.3* 12/20/2015 1120   CL 106 12/20/2015 1120   CO2 28 02/22/2016 1153   CO2 28 12/20/2015 1120   GLUCOSE 109 02/22/2016 1153   GLUCOSE 100* 12/20/2015 1120   GLUCOSE 89 04/25/2015 1148   BUN 10.5 02/22/2016 1153   BUN 12 12/20/2015 1120   BUN 13 04/25/2015 1148   CREATININE 0.7 02/22/2016 1153   CREATININE 0.73 12/20/2015 1120   CREATININE 0.72 05/25/2014 1112   CALCIUM 9.1 02/22/2016 1153   CALCIUM 9.1 12/20/2015 1120   PROT 6.9 02/22/2016 1153   PROT 7.4 12/20/2015 1120   ALBUMIN 3.3* 02/22/2016 1153   ALBUMIN 3.4* 12/20/2015 1120   AST 19 02/22/2016 1153   AST 19 12/20/2015 1120   ALT 20 02/22/2016 1153   ALT 14 12/20/2015 1120   ALKPHOS 95 02/22/2016 1153   ALKPHOS 105 12/20/2015 1120   BILITOT 0.40 02/22/2016 1153   BILITOT 0.3 12/20/2015 1120   GFRNONAA >60 12/20/2015 1120   GFRAA >60 12/20/2015 1120    INo results found for: SPEP, UPEP  Lab Results  Component Value Date   WBC 4.9 02/22/2016   NEUTROABS 2.8 02/22/2016   HGB 11.9 02/22/2016   HCT 36.9 02/22/2016   MCV 84.3 02/22/2016   PLT 277 02/22/2016      Chemistry      Component Value Date/Time   NA 143 02/22/2016 1153   NA 141 12/20/2015 1120   NA 143 04/25/2015 1148   K 3.3* 02/22/2016 1153   K 3.3* 12/20/2015 1120   CL 106 12/20/2015 1120   CO2 28 02/22/2016 1153   CO2 28 12/20/2015 1120   BUN 10.5 02/22/2016 1153   BUN 12 12/20/2015 1120   BUN 13 04/25/2015 1148   CREATININE 0.7 02/22/2016 1153   CREATININE 0.73 12/20/2015  1120   CREATININE 0.72 05/25/2014 1112      Component Value Date/Time   CALCIUM 9.1 02/22/2016 1153   CALCIUM 9.1 12/20/2015 1120   ALKPHOS 95 02/22/2016 1153   ALKPHOS 105 12/20/2015 1120   AST 19 02/22/2016 1153   AST 19 12/20/2015 1120   ALT 20 02/22/2016 1153  ALT 14 12/20/2015 1120   BILITOT 0.40 02/22/2016 1153   BILITOT 0.3 12/20/2015 1120       No results found for: LABCA2  No components found for: [id number]  No results for input(s): INR in the last 168 hours.  Urinalysis No results found for: COLORURINE, APPEARANCEUR, LABSPEC, PHURINE, GLUCOSEU, HGBUR, BILIRUBINUR, KETONESUR, PROTEINUR, UROBILINOGEN, NITRITE, LEUKOCYTESUR  STUDIES: No results found.  ASSESSMENT: 55 y.o.  woman status post right breast upper outer quadrant biopsy 11/15/2015 for a clinical T1c N0, stage IA invasive ductal carcinoma, grade 2, estrogen and progesterone receptor positive, HER-2 amplified, with an MIB-1 of 70%  (1) Genetics testing 12/22/2015 through the Hereditary Gene Panel offered by [facility] found no deleterious mutations in  APC, ATM, AXIN2, BARD1, BMPR1A, BRCA1, BRCA2, BRIP1, CDH1, CDKN2A, CHEK2, DICER1, EPCAM, GREM1, KIT, MEN1, MLH1, MSH2, MSH6, MUTYH, NBN, NF1, PALB2, PDGFRA, PMS2, POLD1, POLE, PTEN, RAD50, RAD51C, RAD51D, SDHA, SDHB, SDHC, SDHD, SMAD4, SMARCA4. STK11, TP53, TSC1, TSC2, and VHL.  (2) right lumpectomy with sentinel lymph node sampling 12/29/2015 showed a pT1c pN0, stage IA invasive ductal carcinoma, grade 3, with negative though close margins (17m to DCIS)  (3) adjuvant chemotherapy with Abraxane weekly 12 with trastuzumab every 3 weeks to start 01/18/2016   (4) trastuzumab to be continued to total one year  (a) echo 12/18/2015 shows an EF of 65-70%  (5) adjuvant radiation to follow chemotherapy  (6) anti-estrogens to follow radiation  PLAN: KAshakiis stable today. The labs were reviewed in detail and were sufficient for treatment. Her potassium is  3.3. We reviewed a list of high potassium foods to incorporate into her diet instead of beginning a supplement for now. She will proceed with cycle 5 of abraxane as planned today.  KTeresewill return in 1 week for cycle 6 of abraxane. She understands and agrees with this plan. She knows the goal of treatment in her case is cure. She Vanessa [name] been encouraged to call with any issues that might arise before her next visit here.   HLaurie Panda NP   02/22/2016 1:28 PM

## 2016-02-28 ENCOUNTER — Other Ambulatory Visit: Payer: Self-pay

## 2016-02-28 DIAGNOSIS — C50411 Malignant neoplasm of upper-outer quadrant of right female breast: Secondary | ICD-10-CM

## 2016-02-29 ENCOUNTER — Encounter: Payer: Self-pay | Admitting: *Deleted

## 2016-02-29 ENCOUNTER — Other Ambulatory Visit (HOSPITAL_BASED_OUTPATIENT_CLINIC_OR_DEPARTMENT_OTHER): Payer: BLUE CROSS/BLUE SHIELD

## 2016-02-29 ENCOUNTER — Ambulatory Visit: Payer: Self-pay | Admitting: Oncology

## 2016-02-29 ENCOUNTER — Ambulatory Visit: Payer: BLUE CROSS/BLUE SHIELD

## 2016-02-29 ENCOUNTER — Ambulatory Visit (HOSPITAL_BASED_OUTPATIENT_CLINIC_OR_DEPARTMENT_OTHER): Payer: BLUE CROSS/BLUE SHIELD

## 2016-02-29 ENCOUNTER — Ambulatory Visit (HOSPITAL_BASED_OUTPATIENT_CLINIC_OR_DEPARTMENT_OTHER): Payer: BLUE CROSS/BLUE SHIELD | Admitting: Nurse Practitioner

## 2016-02-29 ENCOUNTER — Other Ambulatory Visit: Payer: Self-pay

## 2016-02-29 ENCOUNTER — Encounter: Payer: Self-pay | Admitting: Nurse Practitioner

## 2016-02-29 VITALS — BP 137/76 | HR 102 | Temp 97.8°F | Resp 19 | Ht 64.0 in | Wt 291.9 lb

## 2016-02-29 DIAGNOSIS — C50411 Malignant neoplasm of upper-outer quadrant of right female breast: Secondary | ICD-10-CM

## 2016-02-29 DIAGNOSIS — Z170 Estrogen receptor positive status [ER+]: Secondary | ICD-10-CM

## 2016-02-29 DIAGNOSIS — Z95828 Presence of other vascular implants and grafts: Secondary | ICD-10-CM

## 2016-02-29 DIAGNOSIS — Z5111 Encounter for antineoplastic chemotherapy: Secondary | ICD-10-CM | POA: Diagnosis not present

## 2016-02-29 LAB — COMPREHENSIVE METABOLIC PANEL
ALK PHOS: 96 U/L (ref 40–150)
ALT: 16 U/L (ref 0–55)
ANION GAP: 7 meq/L (ref 3–11)
AST: 18 U/L (ref 5–34)
Albumin: 3.2 g/dL — ABNORMAL LOW (ref 3.5–5.0)
BUN: 12.2 mg/dL (ref 7.0–26.0)
CO2: 27 mEq/L (ref 22–29)
CREATININE: 0.7 mg/dL (ref 0.6–1.1)
Calcium: 8.4 mg/dL (ref 8.4–10.4)
Chloride: 108 mEq/L (ref 98–109)
EGFR: >90 mL/min/{1.73_m2} (ref >90)
Glucose: 111 mg/dl (ref 70–140)
Potassium: 3.5 mEq/L (ref 3.5–5.1)
Sodium: 142 mEq/L (ref 136–145)
TOTAL PROTEIN: 6.7 g/dL (ref 6.4–8.3)
Total Bilirubin: 0.35 mg/dL (ref 0.20–1.20)

## 2016-02-29 LAB — CBC WITH DIFFERENTIAL/PLATELET
BASO%: 1.3 % (ref 0.0–2.0)
Basophils Absolute: 0.1 10*3/uL (ref 0.0–0.1)
EOS%: 1.8 % (ref 0.0–7.0)
Eosinophils Absolute: 0.1 10*3/uL (ref 0.0–0.5)
HEMATOCRIT: 35.4 % (ref 34.8–46.6)
HGB: 11.4 g/dL — ABNORMAL LOW (ref 11.6–15.9)
LYMPH#: 1.7 10*3/uL (ref 0.9–3.3)
LYMPH%: 36.5 % (ref 14.0–49.7)
MCH: 27.3 pg (ref 25.1–34.0)
MCHC: 32.3 g/dL (ref 31.5–36.0)
MCV: 84.5 fL (ref 79.5–101.0)
MONO#: 0.3 10*3/uL (ref 0.1–0.9)
MONO%: 5.7 % (ref 0.0–14.0)
NEUT%: 54.7 % (ref 38.4–76.8)
NEUTROS ABS: 2.6 10*3/uL (ref 1.5–6.5)
PLATELETS: 259 10*3/uL (ref 145–400)
RBC: 4.19 10*6/uL (ref 3.70–5.45)
RDW: 14.1 % (ref 11.2–14.5)
WBC: 4.8 10*3/uL (ref 3.9–10.3)

## 2016-02-29 MED ORDER — SODIUM CHLORIDE 0.9 % IJ SOLN
10.0000 mL | INTRAMUSCULAR | Status: DC | PRN
  Administered 2016-02-29: 10 mL
  Filled 2016-02-29: qty 10

## 2016-02-29 MED ORDER — PACLITAXEL PROTEIN-BOUND CHEMO INJECTION 100 MG
100.0000 mg/m2 | Freq: Once | INTRAVENOUS | Status: AC
  Administered 2016-02-29: 225 mg via INTRAVENOUS
  Filled 2016-02-29: qty 45

## 2016-02-29 MED ORDER — HEPARIN SOD (PORK) LOCK FLUSH 100 UNIT/ML IV SOLN
500.0000 [IU] | Freq: Once | INTRAVENOUS | Status: AC | PRN
  Administered 2016-02-29: 500 [IU]
  Filled 2016-02-29: qty 5

## 2016-02-29 MED ORDER — SODIUM CHLORIDE 0.9 % IV SOLN
Freq: Once | INTRAVENOUS | Status: AC
  Administered 2016-02-29: 15:00:00 via INTRAVENOUS

## 2016-02-29 MED ORDER — SODIUM CHLORIDE 0.9 % IV SOLN
Freq: Once | INTRAVENOUS | Status: AC
  Administered 2016-02-29: 15:00:00 via INTRAVENOUS
  Filled 2016-02-29: qty 4

## 2016-02-29 MED ORDER — ONDANSETRON 4 MG PO TBDP: 4.0000 mg | ORAL_TABLET | Freq: Three times a day (TID) | ORAL | Status: DC | PRN

## 2016-02-29 MED ORDER — SODIUM CHLORIDE 0.9% FLUSH
10.0000 mL | INTRAVENOUS | Status: DC | PRN
  Administered 2016-02-29: 10 mL via INTRAVENOUS
  Filled 2016-02-29: qty 10

## 2016-02-29 NOTE — Patient Instructions (Signed)

## 2016-02-29 NOTE — Progress Notes (Signed)
Vanessa [name]  Telephone:(336) [phone] Fax:(336) [phone]     ID: Vanessa [name] DOB: Oct 23, 1961  MR#: [id number]  EXB#:[id number]  Patient Care Team: Aretta Nip, MD as PCP - General (Family Medicine) Erroll Luna, MD as Consulting Physician (General Surgery) Chauncey Cruel, MD as Consulting Physician (Oncology) Marcial Pacas, MD as Consulting Physician (Neurology) Gean Birchwood, DPM as Consulting Physician (Podiatry) Servando Salina, MD as Consulting Physician (Obstetrics and Gynecology) Eppie Gibson, MD as Attending Physician (Radiation Oncology) Larey Dresser, MD as Consulting Physician (Cardiology) PCP: Aretta Nip, MD OTHER MD:  CHIEF COMPLAINT: Triple positive breast cancer  CURRENT TREATMENT:  Abraxane, trastuzumab  BREAST CANCER HISTORY: From the original intake note:  Vanessa [name] had screening mammography at Dr. cousins office on 11/06/2015 suggesting a change in the right breast. She was scheduled for right diagnostic mammography and tomosynthesis with ultrasonography at the [facility] 11/13/2015. The breast density was category B. In the upper outer quadrant of the right breast there was a small irregular mass with suspicious internal calcifications. By exam this was small, mobile, firm, and located at the 10:00 position 7 cm from the nipple. Ultrasound of the right breast confirmed an irregularly marginated hypoechoic mass measuring 1.1 cm. The right axilla was sonographically benign.  Biopsy of the right breast mass in question 11/15/2015 showed ([id number]) an invasive ductal carcinoma, grade 2, estrogen receptor 100% positive, progesterone receptor 2% positive, both with strong staining intensity, with an MIB-1 of 70%, and HER-2 amplification, the signals ratio being 4.92 and the number per cell 17.45.  The patient's subsequent history is as detailed below.  INTERVAL HISTORY: Vanessa [name] returns today for follow-up of her  HER-2 positive  breast cancer, alone. Today is day 1, cycle 6 of 12 planned weekly doses of abraxane. Trastuzumab is given every 3 weeks as well, next due on 3/30.  REVIEW OF SYSTEMS: Vanessa [name] has noticed more taste changes this past week. She has had to be creative with her diet choices. She is recognizing more nausea, but it is easily managed with ginger ale and zofran PRN. She continues to work with good endurance. She denies mouth sores, rashes, or neuropathy symptoms. She is sleeping well on the lower dose of melatonin. A detailed review of systems is otherwise stable.  PAST MEDICAL HISTORY: Past Medical History  Diagnosis Date  . Allergy   . Arthritis   . Anemia   . Trigeminal neuralgia 05/2014  . Family history of breast cancer   . Family history of colon cancer   . Family history of thyroid cancer   . Hypertension     no meds now  . Anxiety   . PONV (postoperative nausea and vomiting)     PAST SURGICAL HISTORY: Past Surgical History  Procedure Laterality Date  . Breast surgery      biopsy  . Abdominal hysterectomy    . Meniscus repair      L knee  . Hysterotomy    . Eye surgery    . Breast lumpectomy with radioactive seed and sentinel lymph node biopsy Right 12/29/2015    Procedure: RIGHT BREAST  RADIOACTIVE SEED LOCALIZATION LUMPECTOMY AND RIGHT SENTINEL LYMPH NODE MAPPING;  Surgeon: Erroll Luna, MD;  Location: [facility];  Service: General;  Laterality: Right;  . Portacath placement N/A 12/29/2015    Procedure: INSERTION PORT-A-CATH;  Surgeon: Erroll Luna, MD;  Location: [facility];  Service: General;  Laterality: N/A;    FAMILY HISTORY  Family History  Problem Relation Age of Onset  . Diabetes Mother   . Hypertension Mother   . Asthma Daughter   . Diabetes Sister   . Hypertension Sister   . Colon cancer Sister 5  . Thyroid cancer Sister 61  . Lung cancer Father   . Breast cancer Cousin     maternal first cousin  . Breast cancer Cousin 33     maternal first cousin - inflammatory   the patient's father died from lung cancer at the age of 3, in the setting of tobacco abuse. The patient's mother is living, at age 30. The patient had 2 brothers, 3 sisters. One sister was diagnosed with colon cancer in her 11s and later thyroid cancer. She died from metastatic disease. One brother died with pulmonary problems. On the maternal side a cousin was diagnosed with inflammatory breast cancer at the age of 34. A second cousin on the maternal side was diagnosed with breast cancer in her early 48s. There is no history of ovarian cancer in the family.  GYNECOLOGIC HISTORY:  No LMP recorded. Patient is postmenopausal. Menarche age 54, first live birth age 65, the patient is Vanessa [name] P2. She underwent simple hysterectomy with bilateral salpingectomy 06/28/2010, with benign pathology([id number]). She still has her ovaries in place. 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 At home she lives with her daughter Vanessa [name], who teaches theater at [employer] middle school and is currently studying towards a Masters in counseling; and her son Vanessa [name], who is a professional basketball player in the international circuit (currently with the [employer]). The patient has no grandchildren. She attends a Tour manager    ADVANCED DIRECTIVES: Not in place. At the initial clinic visit 02/29/2016 the patient was given the appropriate forms to complete and notarize at her discretion.  HEALTH MAINTENANCE: Social History  Substance Use Topics  . Smoking status: Never Smoker   . Smokeless tobacco: Never Used  . Alcohol Use: Yes     Comment: social     Colonoscopy: 2016/[facility]  PAP:  Bone density:  Lipid panel:  Allergies  Allergen Reactions  . Penicillins Anaphylaxis  . Cyclobenzaprine Swelling  .  Hydrocodone Itching  . Meperidine Swelling  . Amoxicillin-Pot Clavulanate Rash  . Cefaclor Rash  . Codeine Rash  . Darvocet [Propoxyphene N-Acetaminophen] Rash  . Demerol Rash  . Erythromycin Rash  . Flexeril [Cyclobenzaprine Hcl] Rash  . Percocet [Oxycodone-Acetaminophen] Itching and Rash    Red streaks come up on skin  . Sulfa Antibiotics Rash    Current Outpatient Prescriptions  Medication Sig Dispense Refill  . acetaminophen (TYLENOL) 325 MG tablet Take 650 mg by mouth every 6 (six) hours as needed. Reported on 02/15/2016    . cetirizine (ZYRTEC) 10 MG tablet Take 10 mg by mouth daily.    Vanessa [name] Sodium (COLACE PO) Take 1 tablet by mouth daily.    . ergocalciferol (VITAMIN D2) 50000 units capsule Take 50,000 Units by mouth once a week. Pt takes Vit D2 on Wed.    Marland Kitchen gabapentin (NEURONTIN) 600 MG tablet Take 1 tablet (600 mg total) by mouth 3 (three) times daily. (Patient taking differently: Take 600 mg by mouth at bedtime. ) 90 tablet 6  . lidocaine-prilocaine (EMLA) cream Apply 1 application topically as needed. 30 g 1  . Multiple Vitamins-Minerals (CENTRUM SILVER ADULT 50+ PO)  Take by mouth.    . ondansetron (ZOFRAN ODT) 4 MG disintegrating tablet Take 1 tablet (4 mg total) by mouth every 8 (eight) hours as needed for nausea or vomiting. 20 tablet 1  . senna (SENOKOT) 8.6 MG tablet Take 1 tablet by mouth daily. Reported on 02/22/2016     No current facility-administered medications for this visit.    OBJECTIVE: Middle-aged African-American woman  Filed Vitals:   02/29/16 1253  BP: 137/76  Pulse: 102  Temp: 97.8 F (36.6 C)  Resp: 19     Body mass index is 50.08 kg/(m^2).    ECOG FS:1 - Symptomatic but completely ambulatory  Skin: warm, dry  HEENT: sclerae anicteric, conjunctivae pink, oropharynx clear. No thrush or mucositis.  Lymph Nodes: No cervical or supraclavicular lymphadenopathy  Lungs: clear to auscultation bilaterally, no rales, wheezes, or rhonci  Heart:  regular rate and rhythm  Abdomen: round, soft, non tender, positive bowel sounds  Musculoskeletal: No focal spinal tenderness, no peripheral edema  Neuro: non focal, well oriented, positive affect  Breasts: deferred  LAB RESULTS:  CMP     Component Value Date/Time   NA 142 02/29/2016 1231   NA 141 12/20/2015 1120   NA 143 04/25/2015 1148   K 3.5 02/29/2016 1231   K 3.3* 12/20/2015 1120   CL 106 12/20/2015 1120   CO2 27 02/29/2016 1231   CO2 28 12/20/2015 1120   GLUCOSE 111 02/29/2016 1231   GLUCOSE 100* 12/20/2015 1120   GLUCOSE 89 04/25/2015 1148   BUN 12.2 02/29/2016 1231   BUN 12 12/20/2015 1120   BUN 13 04/25/2015 1148   CREATININE 0.7 02/29/2016 1231   CREATININE 0.73 12/20/2015 1120   CREATININE 0.72 05/25/2014 1112   CALCIUM 8.4 02/29/2016 1231   CALCIUM 9.1 12/20/2015 1120   PROT 6.7 02/29/2016 1231   PROT 7.4 12/20/2015 1120   ALBUMIN 3.2* 02/29/2016 1231   ALBUMIN 3.4* 12/20/2015 1120   AST 18 02/29/2016 1231   AST 19 12/20/2015 1120   ALT 16 02/29/2016 1231   ALT 14 12/20/2015 1120   ALKPHOS 96 02/29/2016 1231   ALKPHOS 105 12/20/2015 1120   BILITOT 0.35 02/29/2016 1231   BILITOT 0.3 12/20/2015 1120   GFRNONAA >60 12/20/2015 1120   GFRAA >60 12/20/2015 1120    INo results found for: SPEP, UPEP  Lab Results  Component Value Date   WBC 4.8 02/29/2016   NEUTROABS 2.6 02/29/2016   HGB 11.4* 02/29/2016   HCT 35.4 02/29/2016   MCV 84.5 02/29/2016   PLT 259 02/29/2016      Chemistry      Component Value Date/Time   NA 142 02/29/2016 1231   NA 141 12/20/2015 1120   NA 143 04/25/2015 1148   K 3.5 02/29/2016 1231   K 3.3* 12/20/2015 1120   CL 106 12/20/2015 1120   CO2 27 02/29/2016 1231   CO2 28 12/20/2015 1120   BUN 12.2 02/29/2016 1231   BUN 12 12/20/2015 1120   BUN 13 04/25/2015 1148   CREATININE 0.7 02/29/2016 1231   CREATININE 0.73 12/20/2015 1120   CREATININE 0.72 05/25/2014 1112      Component Value Date/Time   CALCIUM 8.4  02/29/2016 1231   CALCIUM 9.1 12/20/2015 1120   ALKPHOS 96 02/29/2016 1231   ALKPHOS 105 12/20/2015 1120   AST 18 02/29/2016 1231   AST 19 12/20/2015 1120   ALT 16 02/29/2016 1231   ALT 14 12/20/2015 1120   BILITOT 0.35 02/29/2016 1231  BILITOT 0.3 12/20/2015 1120       No results found for: LABCA2  No components found for: [id number]  No results for input(s): INR in the last 168 hours.  Urinalysis No results found for: COLORURINE, APPEARANCEUR, LABSPEC, PHURINE, GLUCOSEU, HGBUR, BILIRUBINUR, KETONESUR, PROTEINUR, UROBILINOGEN, NITRITE, LEUKOCYTESUR  STUDIES: No results found.  ASSESSMENT: 55 y.o. Vanessa [name] woman status post right breast upper outer quadrant biopsy 11/15/2015 for a clinical T1c N0, stage IA invasive ductal carcinoma, grade 2, estrogen and progesterone receptor positive, HER-2 amplified, with an MIB-1 of 70%  (1) Genetics testing 12/22/2015 through the Hereditary Gene Panel offered by [facility] found no deleterious mutations in  APC, ATM, AXIN2, BARD1, BMPR1A, BRCA1, BRCA2, BRIP1, CDH1, CDKN2A, CHEK2, DICER1, EPCAM, GREM1, KIT, MEN1, MLH1, MSH2, MSH6, MUTYH, NBN, NF1, PALB2, PDGFRA, PMS2, POLD1, POLE, PTEN, RAD50, RAD51C, RAD51D, SDHA, SDHB, SDHC, SDHD, SMAD4, SMARCA4. STK11, TP53, TSC1, TSC2, and VHL.  (2) right lumpectomy with sentinel lymph node sampling 12/29/2015 showed a pT1c pN0, stage IA invasive ductal carcinoma, grade 3, with negative though close margins (66m to DCIS)  (3) adjuvant chemotherapy with Abraxane weekly 12 with trastuzumab every 3 weeks to start 01/18/2016   (4) trastuzumab to be continued to total one year  (a) echo 12/18/2015 shows an EF of 65-70%  (5) adjuvant radiation to follow chemotherapy  (6) anti-estrogens to follow radiation  PLAN: KPrettycontinues to tolerate treatment well with no significant side effects. The labs were reviewed in detail and were stable. Her potassium is back to normal at 3.5. She will proceed with cycle 6  of abraxane as planned today.  KMaralynwill return in 1 week for cycle 7 of abraxane and her next dose of trastuzumab. She understands and agrees with this plan. She knows the goal of treatment in her case is cure. She has been encouraged to call with any issues that might arise before her next visit here.   HLaurie Panda NP   02/29/2016 1:18 PM

## 2016-02-29 NOTE — Patient Instructions (Signed)

## 2016-03-06 ENCOUNTER — Other Ambulatory Visit: Payer: Self-pay | Admitting: *Deleted

## 2016-03-06 DIAGNOSIS — C50411 Malignant neoplasm of upper-outer quadrant of right female breast: Secondary | ICD-10-CM

## 2016-03-07 ENCOUNTER — Ambulatory Visit: Payer: BLUE CROSS/BLUE SHIELD

## 2016-03-07 ENCOUNTER — Encounter: Payer: Self-pay | Admitting: Nurse Practitioner

## 2016-03-07 ENCOUNTER — Ambulatory Visit (HOSPITAL_BASED_OUTPATIENT_CLINIC_OR_DEPARTMENT_OTHER): Payer: BLUE CROSS/BLUE SHIELD

## 2016-03-07 ENCOUNTER — Telehealth: Payer: Self-pay | Admitting: Nurse Practitioner

## 2016-03-07 ENCOUNTER — Other Ambulatory Visit: Payer: Self-pay

## 2016-03-07 ENCOUNTER — Ambulatory Visit (HOSPITAL_BASED_OUTPATIENT_CLINIC_OR_DEPARTMENT_OTHER): Payer: BLUE CROSS/BLUE SHIELD | Admitting: Nurse Practitioner

## 2016-03-07 ENCOUNTER — Other Ambulatory Visit: Payer: Self-pay | Admitting: Hematology and Oncology

## 2016-03-07 ENCOUNTER — Other Ambulatory Visit (HOSPITAL_BASED_OUTPATIENT_CLINIC_OR_DEPARTMENT_OTHER): Payer: BLUE CROSS/BLUE SHIELD

## 2016-03-07 ENCOUNTER — Encounter: Payer: Self-pay | Admitting: *Deleted

## 2016-03-07 VITALS — BP 149/64 | HR 108 | Temp 97.8°F | Resp 18 | Wt 289.9 lb

## 2016-03-07 DIAGNOSIS — C50411 Malignant neoplasm of upper-outer quadrant of right female breast: Secondary | ICD-10-CM

## 2016-03-07 DIAGNOSIS — Z5111 Encounter for antineoplastic chemotherapy: Secondary | ICD-10-CM

## 2016-03-07 DIAGNOSIS — Z170 Estrogen receptor positive status [ER+]: Secondary | ICD-10-CM

## 2016-03-07 DIAGNOSIS — Z5112 Encounter for antineoplastic immunotherapy: Secondary | ICD-10-CM | POA: Diagnosis not present

## 2016-03-07 DIAGNOSIS — Z95828 Presence of other vascular implants and grafts: Secondary | ICD-10-CM

## 2016-03-07 DIAGNOSIS — E876 Hypokalemia: Secondary | ICD-10-CM

## 2016-03-07 LAB — CBC WITH DIFFERENTIAL/PLATELET
BASO%: 1 % (ref 0.0–2.0)
BASOS ABS: 0 10*3/uL (ref 0.0–0.1)
EOS ABS: 0.1 10*3/uL (ref 0.0–0.5)
EOS%: 1.8 % (ref 0.0–7.0)
HCT: 36.2 % (ref 34.8–46.6)
HGB: 11.8 g/dL (ref 11.6–15.9)
LYMPH%: 30.2 % (ref 14.0–49.7)
MCH: 27.7 pg (ref 25.1–34.0)
MCHC: 32.7 g/dL (ref 31.5–36.0)
MCV: 84.8 fL (ref 79.5–101.0)
MONO#: 0.3 10*3/uL (ref 0.1–0.9)
MONO%: 5.6 % (ref 0.0–14.0)
NEUT#: 3.1 10*3/uL (ref 1.5–6.5)
NEUT%: 61.4 % (ref 38.4–76.8)
PLATELETS: 274 10*3/uL (ref 145–400)
RBC: 4.27 10*6/uL (ref 3.70–5.45)
RDW: 14.1 % (ref 11.2–14.5)
WBC: 5.1 10*3/uL (ref 3.9–10.3)
lymph#: 1.5 10*3/uL (ref 0.9–3.3)

## 2016-03-07 LAB — COMPREHENSIVE METABOLIC PANEL
ALT: 16 U/L (ref 0–55)
ANION GAP: 7 meq/L (ref 3–11)
AST: 18 U/L (ref 5–34)
Albumin: 3.2 g/dL — ABNORMAL LOW (ref 3.5–5.0)
Alkaline Phosphatase: 95 U/L (ref 40–150)
BILIRUBIN TOTAL: 0.33 mg/dL (ref 0.20–1.20)
BUN: 10.1 mg/dL (ref 7.0–26.0)
CHLORIDE: 109 meq/L (ref 98–109)
CO2: 27 meq/L (ref 22–29)
Calcium: 8.9 mg/dL (ref 8.4–10.4)
Creatinine: 0.7 mg/dL (ref 0.6–1.1)
Glucose: 121 mg/dl (ref 70–140)
POTASSIUM: 3.3 meq/L — AB (ref 3.5–5.1)
Sodium: 142 mEq/L (ref 136–145)
TOTAL PROTEIN: 6.8 g/dL (ref 6.4–8.3)

## 2016-03-07 MED ORDER — SODIUM CHLORIDE 0.9% FLUSH
10.0000 mL | INTRAVENOUS | Status: DC | PRN
  Administered 2016-03-07: 10 mL via INTRAVENOUS
  Filled 2016-03-07: qty 10

## 2016-03-07 MED ORDER — PACLITAXEL PROTEIN-BOUND CHEMO INJECTION 100 MG
100.0000 mg/m2 | Freq: Once | INTRAVENOUS | Status: AC
  Administered 2016-03-07: 225 mg via INTRAVENOUS
  Filled 2016-03-07: qty 45

## 2016-03-07 MED ORDER — ACETAMINOPHEN 325 MG PO TABS
650.0000 mg | ORAL_TABLET | Freq: Once | ORAL | Status: AC
  Administered 2016-03-07: 650 mg via ORAL

## 2016-03-07 MED ORDER — DIPHENHYDRAMINE HCL 25 MG PO CAPS
ORAL_CAPSULE | ORAL | Status: AC
  Filled 2016-03-07: qty 2

## 2016-03-07 MED ORDER — SODIUM CHLORIDE 0.9 % IV SOLN
Freq: Once | INTRAVENOUS | Status: AC
  Administered 2016-03-07: 12:00:00 via INTRAVENOUS
  Filled 2016-03-07: qty 4

## 2016-03-07 MED ORDER — SODIUM CHLORIDE 0.9 % IV SOLN
Freq: Once | INTRAVENOUS | Status: AC
  Administered 2016-03-07: 12:00:00 via INTRAVENOUS

## 2016-03-07 MED ORDER — HEPARIN SOD (PORK) LOCK FLUSH 100 UNIT/ML IV SOLN
500.0000 [IU] | Freq: Once | INTRAVENOUS | Status: AC | PRN
  Administered 2016-03-07: 500 [IU]
  Filled 2016-03-07: qty 5

## 2016-03-07 MED ORDER — ACETAMINOPHEN 325 MG PO TABS
ORAL_TABLET | ORAL | Status: AC
  Filled 2016-03-07: qty 2

## 2016-03-07 MED ORDER — DIPHENHYDRAMINE HCL 25 MG PO CAPS
50.0000 mg | ORAL_CAPSULE | Freq: Once | ORAL | Status: AC
  Administered 2016-03-07: 50 mg via ORAL

## 2016-03-07 MED ORDER — SODIUM CHLORIDE 0.9% FLUSH
10.0000 mL | INTRAVENOUS | Status: DC | PRN
  Administered 2016-03-07: 10 mL
  Filled 2016-03-07: qty 10

## 2016-03-07 MED ORDER — TRASTUZUMAB CHEMO INJECTION 440 MG
6.0000 mg/kg | Freq: Once | INTRAVENOUS | Status: AC
  Administered 2016-03-07: 756 mg via INTRAVENOUS
  Filled 2016-03-07: qty 36

## 2016-03-07 NOTE — Patient Instructions (Signed)

## 2016-03-07 NOTE — Progress Notes (Signed)
Vanessa [name]  [phone] [phone]     ID: BRENLYN BESHARA DOB: 1960/12/30  MR#: [id number]  YJE#:[id number]  Patient Care Team: Aretta Nip, MD as PCP - General (Family Medicine) Erroll Luna, MD as Consulting Physician (General Surgery) Chauncey Cruel, MD as Consulting Physician (Oncology) Marcial Pacas, MD as Consulting Physician (Neurology) Gean Birchwood, DPM as Consulting Physician (Podiatry) Servando Salina, MD as Consulting Physician (Obstetrics and Gynecology) Eppie Gibson, MD as Attending Physician (Radiation Oncology) Larey Dresser, MD as Consulting Physician (Cardiology) PCP: Aretta Nip, MD OTHER MD:  CHIEF COMPLAINT: Triple positive breast cancer  CURRENT TREATMENT:  Abraxane, trastuzumab  BREAST CANCER HISTORY: From the original intake note:  Nashla had screening mammography at Dr. cousins office on 11/06/2015 suggesting a change in the right breast. She was scheduled for right diagnostic mammography and tomosynthesis with ultrasonography at the [facility] 11/13/2015. The breast density was category B. In the upper outer quadrant of the right breast there was a small irregular mass with suspicious internal calcifications. By exam this was small, mobile, firm, and located at the 10:00 position 7 cm from the nipple. Ultrasound of the right breast confirmed an irregularly marginated hypoechoic mass measuring 1.1 cm. The right axilla was sonographically benign.  Biopsy of the right breast mass in question 11/15/2015 showed ([id number]) an invasive ductal carcinoma, grade 2, estrogen receptor 100% positive, progesterone receptor 2% positive, both with strong staining intensity, with an MIB-1 of 70%, and HER-2 amplification, the signals ratio being 4.92 and the number per cell 17.45.  The patient's subsequent history is as detailed below.  INTERVAL HISTORY: Eniyah returns today for follow-up of her  HER-2 positive  breast cancer, alone. Today is day 1, cycle 7 of 12 planned weekly doses of abraxane. Trastuzumab is given every 3 weeks as well, next due today.   REVIEW OF SYSTEMS: Marguerite denies fevers, chills, nausea, or vomiting. She moves her bowels at least daily. She takes colace daily to avoid constipation. Her appetite is poor. She is skipping meals frequently, sometimes going 10 hours without eating at all. She complains of taste changes. She is more easily fatigued. She is short of breath with exertion, but denies chest pain, cough, or palpitations. She has no mouth sores or rashes. She had tingling her her hands for about an hour last week, and hasn't felt it again. A detailed review of systems is otherwise stable.  PAST MEDICAL HISTORY: Past Medical History  Diagnosis Date  . Allergy   . Arthritis   . Anemia   . Trigeminal neuralgia 05/2014  . Family history of breast cancer   . Family history of colon cancer   . Family history of thyroid cancer   . Hypertension     no meds now  . Anxiety   . PONV (postoperative nausea and vomiting)     PAST SURGICAL HISTORY: Past Surgical History  Procedure Laterality Date  . Breast surgery      biopsy  . Abdominal hysterectomy    . Meniscus repair      L knee  . Hysterotomy    . Eye surgery    . Breast lumpectomy with radioactive seed and sentinel lymph node biopsy Right 12/29/2015    Procedure: RIGHT BREAST  RADIOACTIVE SEED LOCALIZATION LUMPECTOMY AND RIGHT SENTINEL LYMPH NODE MAPPING;  Surgeon: Erroll Luna, MD;  Location: [facility];  Service: General;  Laterality: Right;  . Portacath placement N/A 12/29/2015  Procedure: INSERTION PORT-A-CATH;  Surgeon: Erroll Luna, MD;  Location: [facility];  Service: General;  Laterality: N/A;    FAMILY HISTORY Family History  Problem Relation Age of Onset  . Diabetes Mother   . Hypertension Mother   . Asthma Daughter   . Diabetes Sister   . Hypertension Sister   .  Colon cancer Sister 73  . Thyroid cancer Sister 64  . Lung cancer Father   . Breast cancer Cousin     maternal first cousin  . Breast cancer Cousin 65    maternal first cousin - inflammatory   the patient's father died from lung cancer at the age of 54, in the setting of tobacco abuse. The patient's mother is living, at age 67. The patient had 2 brothers, 3 sisters. One sister was diagnosed with colon cancer in her 79s and later thyroid cancer. She died from metastatic disease. One brother died with pulmonary problems. On the maternal side a cousin was diagnosed with inflammatory breast cancer at the age of 43. A second cousin on the maternal side was diagnosed with breast cancer in her early 15s. There is no history of ovarian cancer in the family.  GYNECOLOGIC HISTORY:  No LMP recorded. Patient is postmenopausal. Menarche age 56, first live birth age 17, the patient is [place] P2. She underwent simple hysterectomy with bilateral salpingectomy 06/28/2010, with benign pathology([id number]). She still has her ovaries in place. 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 She describes herself is single. At home she lives with her daughter Annielee Jemmott, who teaches theater at [employer] middle [employer] and is currently studying towards a Masters in counseling; and her son [name], who is a professional basketball player in the international circuit (currently with the [employer]). The patient has no grandchildren. She attends a Tour manager    ADVANCED DIRECTIVES: Not in place. At the initial clinic visit 03/07/2016 the patient was given the appropriate forms to complete and notarize at her discretion.  HEALTH MAINTENANCE: Social History  Substance Use Topics  . Smoking status: Never Smoker   . Smokeless tobacco: Never Used  . Alcohol Use: Yes     Comment: social      Colonoscopy: 2016/[facility]  PAP:  Bone density:  Lipid panel:  Allergies  Allergen Reactions  . Penicillins Anaphylaxis  . Cyclobenzaprine Swelling  . Hydrocodone Itching  . Meperidine Swelling  . Amoxicillin-Pot Clavulanate Rash  . Cefaclor Rash  . Codeine Rash  . Darvocet [Propoxyphene N-Acetaminophen] Rash  . Demerol Rash  . Erythromycin Rash  . Flexeril [Cyclobenzaprine Hcl] Rash  . Percocet [Oxycodone-Acetaminophen] Itching and Rash    Red streaks come up on skin  . Sulfa Antibiotics Rash    Current Outpatient Prescriptions  Medication Sig Dispense Refill  . acetaminophen (TYLENOL) 325 MG tablet Take 650 mg by mouth every 6 (six) hours as needed. Reported on 02/15/2016    . cetirizine (ZYRTEC) 10 MG tablet Take 10 mg by mouth daily.    Mariane Baumgarten Sodium (COLACE PO) Take 1 tablet by mouth daily.    Marland Kitchen gabapentin (NEURONTIN) 600 MG tablet Take 1 tablet (600 mg total) by mouth 3 (three) times daily. (Patient taking differently: Take 600 mg by mouth at bedtime. ) 90 tablet 6  . lidocaine-prilocaine (EMLA) cream Apply 1 application topically as needed. 30 g 1  . Multiple Vitamins-Minerals (CENTRUM SILVER ADULT 50+  PO) Take by mouth.    . ondansetron (ZOFRAN ODT) 4 MG disintegrating tablet Take 1 tablet (4 mg total) by mouth every 8 (eight) hours as needed for nausea or vomiting. 20 tablet 1  . senna (SENOKOT) 8.6 MG tablet Take 1 tablet by mouth daily. Reported on 03/07/2016     No current facility-administered medications for this visit.    OBJECTIVE: Middle-aged African-American woman  Filed Vitals:   03/07/16 1102  BP: 149/64  Pulse: 108  Temp: 97.8 F (36.6 C)  Resp: 18     Body mass index is 49.74 kg/(m^2).    ECOG FS:1 - Symptomatic but completely ambulatory  Sclerae unicteric, pupils round and equal Oropharynx clear and moist-- no thrush or other lesions No cervical or supraclavicular adenopathy Lungs no rales or rhonchi Heart regular rate and rhythm Abd  soft, nontender, positive bowel sounds MSK no focal spinal tenderness, no upper extremity lymphedema Neuro: nonfocal, well oriented, appropriate affect Breasts: deferred  LAB RESULTS:  CMP     Component Value Date/Time   NA 142 03/07/2016 1006   NA 141 12/20/2015 1120   NA 143 04/25/2015 1148   K 3.3* 03/07/2016 1006   K 3.3* 12/20/2015 1120   CL 106 12/20/2015 1120   CO2 27 03/07/2016 1006   CO2 28 12/20/2015 1120   GLUCOSE 121 03/07/2016 1006   GLUCOSE 100* 12/20/2015 1120   GLUCOSE 89 04/25/2015 1148   BUN 10.1 03/07/2016 1006   BUN 12 12/20/2015 1120   BUN 13 04/25/2015 1148   CREATININE 0.7 03/07/2016 1006   CREATININE 0.73 12/20/2015 1120   CREATININE 0.72 05/25/2014 1112   CALCIUM 8.9 03/07/2016 1006   CALCIUM 9.1 12/20/2015 1120   PROT 6.8 03/07/2016 1006   PROT 7.4 12/20/2015 1120   ALBUMIN 3.2* 03/07/2016 1006   ALBUMIN 3.4* 12/20/2015 1120   AST 18 03/07/2016 1006   AST 19 12/20/2015 1120   ALT 16 03/07/2016 1006   ALT 14 12/20/2015 1120   ALKPHOS 95 03/07/2016 1006   ALKPHOS 105 12/20/2015 1120   BILITOT 0.33 03/07/2016 1006   BILITOT 0.3 12/20/2015 1120   GFRNONAA >60 12/20/2015 1120   GFRAA >60 12/20/2015 1120    INo results found for: SPEP, UPEP  Lab Results  Component Value Date   WBC 5.1 03/07/2016   NEUTROABS 3.1 03/07/2016   HGB 11.8 03/07/2016   HCT 36.2 03/07/2016   MCV 84.8 03/07/2016   PLT 274 03/07/2016      Chemistry      Component Value Date/Time   NA 142 03/07/2016 1006   NA 141 12/20/2015 1120   NA 143 04/25/2015 1148   K 3.3* 03/07/2016 1006   K 3.3* 12/20/2015 1120   CL 106 12/20/2015 1120   CO2 27 03/07/2016 1006   CO2 28 12/20/2015 1120   BUN 10.1 03/07/2016 1006   BUN 12 12/20/2015 1120   BUN 13 04/25/2015 1148   CREATININE 0.7 03/07/2016 1006   CREATININE 0.73 12/20/2015 1120   CREATININE 0.72 05/25/2014 1112      Component Value Date/Time   CALCIUM 8.9 03/07/2016 1006   CALCIUM 9.1 12/20/2015 1120    ALKPHOS 95 03/07/2016 1006   ALKPHOS 105 12/20/2015 1120   AST 18 03/07/2016 1006   AST 19 12/20/2015 1120   ALT 16 03/07/2016 1006   ALT 14 12/20/2015 1120   BILITOT 0.33 03/07/2016 1006   BILITOT 0.3 12/20/2015 1120       No results found for:  LABCA2  No components found for: LABCA125  No results for input(s): INR in the last 168 hours.  Urinalysis No results found for: COLORURINE, APPEARANCEUR, LABSPEC, PHURINE, GLUCOSEU, HGBUR, BILIRUBINUR, KETONESUR, PROTEINUR, UROBILINOGEN, NITRITE, LEUKOCYTESUR  STUDIES: No results found.  ASSESSMENT: 55 y.o. [place] woman status post right breast upper outer quadrant biopsy 11/15/2015 for a clinical T1c N0, stage IA invasive ductal carcinoma, grade 2, estrogen and progesterone receptor positive, HER-2 amplified, with an MIB-1 of 70%  (1) Genetics testing 12/22/2015 through the Hereditary Gene Panel offered by [facility] found no deleterious mutations in  APC, ATM, AXIN2, BARD1, BMPR1A, BRCA1, BRCA2, BRIP1, CDH1, CDKN2A, CHEK2, DICER1, EPCAM, GREM1, KIT, MEN1, MLH1, MSH2, MSH6, MUTYH, NBN, NF1, PALB2, PDGFRA, PMS2, POLD1, POLE, PTEN, RAD50, RAD51C, RAD51D, SDHA, SDHB, SDHC, SDHD, SMAD4, SMARCA4. STK11, TP53, TSC1, TSC2, and VHL.  (2) right lumpectomy with sentinel lymph node sampling 12/29/2015 showed a pT1c pN0, stage IA invasive ductal carcinoma, grade 3, with negative though close margins (8m to DCIS)  (3) adjuvant chemotherapy with Abraxane weekly 12 with trastuzumab every 3 weeks to start 01/18/2016   (4) trastuzumab to be continued to total one year  (a) echo 12/18/2015 shows an EF of 65-70%  (5) adjuvant radiation to follow chemotherapy  (6) anti-estrogens to follow radiation  PLAN: While KAlajiais not losing any weight, her eating habits are inconsistent. I have advised her to begin a protein supplement daily, and suggested she try eating smaller meals more frequently. She will also focus on that list of high potassium foods.  Her potassium level is at 3.3 today. The rest of the labs were normal. She will proceed with cycle 7 of abraxane as planned today, along with her next dose of trastuzumab.  KFumiyewill return in 1 week for cycle 8 of abraxane. She understands and agrees with this plan. She knows the goal of treatment in her case is cure. She has been encouraged to call with any issues that might arise before her next visit here.   HLaurie Panda NP   03/07/2016 11:37 AM

## 2016-03-07 NOTE — Patient Instructions (Addendum)
[facility] Discharge Instructions for Patients Receiving Chemotherapy  Today you received the following chemotherapy agents: Abraxane and Herceptin.  To help prevent nausea and vomiting after your treatment, we encourage you to take your nausea medication: Zofran. Take one every 8 hours as needed.   If you develop nausea and vomiting that is not controlled by your nausea medication, call the clinic.   BELOW ARE SYMPTOMS THAT SHOULD BE REPORTED IMMEDIATELY:  *FEVER GREATER THAN 100.5 F  *CHILLS WITH OR WITHOUT FEVER  NAUSEA AND VOMITING THAT IS NOT CONTROLLED WITH YOUR NAUSEA MEDICATION  *UNUSUAL SHORTNESS OF BREATH  *UNUSUAL BRUISING OR BLEEDING  TENDERNESS IN MOUTH AND THROAT WITH OR WITHOUT PRESENCE OF ULCERS  *URINARY PROBLEMS  *BOWEL PROBLEMS  UNUSUAL RASH Items with * indicate a potential emergency and should be followed up as soon as possible.  Feel free to call the clinic should you have any questions or concerns. The clinic phone number is [phone].  Please show the [facility] at check-in to the Emergency Department and triage nurse.

## 2016-03-07 NOTE — Telephone Encounter (Signed)
appt made and avs will print in treatment room °

## 2016-03-08 ENCOUNTER — Ambulatory Visit: Payer: Self-pay | Admitting: Cardiology

## 2016-03-08 LAB — VITAMIN D 25 HYDROXY (VIT D DEFICIENCY, FRACTURES): VIT D 25 HYDROXY: 33.9 ng/mL (ref 30.0–100.0)

## 2016-03-11 ENCOUNTER — Other Ambulatory Visit: Payer: Self-pay | Admitting: Nurse Practitioner

## 2016-03-13 ENCOUNTER — Ambulatory Visit (HOSPITAL_COMMUNITY)
Admission: RE | Admit: 2016-03-13 | Discharge: 2016-03-13 | Disposition: A | Discharge status: Home / Self Care | Payer: BLUE CROSS/BLUE SHIELD | Source: Ambulatory Visit | Attending: Cardiology | Admitting: Cardiology

## 2016-03-13 ENCOUNTER — Ambulatory Visit (HOSPITAL_BASED_OUTPATIENT_CLINIC_OR_DEPARTMENT_OTHER)
Admission: RE | Admit: 2016-03-13 | Discharge: 2016-03-13 | Disposition: A | Discharge status: Home / Self Care | Payer: BLUE CROSS/BLUE SHIELD | Source: Ambulatory Visit | Attending: Cardiology | Admitting: Cardiology

## 2016-03-13 ENCOUNTER — Other Ambulatory Visit (HOSPITAL_COMMUNITY): Payer: Self-pay | Admitting: *Deleted

## 2016-03-13 ENCOUNTER — Other Ambulatory Visit (HOSPITAL_COMMUNITY): Payer: Self-pay | Admitting: Cardiology

## 2016-03-13 VITALS — BP 136/76 | HR 104 | Wt 289.0 lb

## 2016-03-13 DIAGNOSIS — C50011 Malignant neoplasm of nipple and areola, right female breast: Secondary | ICD-10-CM

## 2016-03-13 DIAGNOSIS — C50411 Malignant neoplasm of upper-outer quadrant of right female breast: Secondary | ICD-10-CM | POA: Diagnosis not present

## 2016-03-13 DIAGNOSIS — Z801 Family history of malignant neoplasm of trachea, bronchus and lung: Secondary | ICD-10-CM | POA: Insufficient documentation

## 2016-03-13 DIAGNOSIS — Z170 Estrogen receptor positive status [ER+]: Secondary | ICD-10-CM | POA: Diagnosis not present

## 2016-03-13 DIAGNOSIS — I119 Hypertensive heart disease without heart failure: Secondary | ICD-10-CM | POA: Diagnosis not present

## 2016-03-13 DIAGNOSIS — Z79899 Other long term (current) drug therapy: Secondary | ICD-10-CM | POA: Diagnosis not present

## 2016-03-13 DIAGNOSIS — C50911 Malignant neoplasm of unspecified site of right female breast: Secondary | ICD-10-CM | POA: Diagnosis not present

## 2016-03-13 NOTE — Progress Notes (Signed)
Patient ID: Vanessa [name], female   DOB: [birth year], 55 y.o.   MRN: [id number]  Oncologist: Dr. Jana Hakim  55 yo with breast cancer presents for cardiology evaluation prior to starting Herceptin.  She was diagnosed with triple positive breast cancer in 12/16.  Plans are lumpectomy/lymph node dissection on 12/30/15 followed by abraxane/Herceptin x 12 weeks then Herceptin alone to complete 1 year.  She will have radiation as well.  I reviewed today's echo: EF normal, mild LV hypertrophy.   Overall feeling ok. She had completed 7/12 treatments. Mild dyspnea with exertion.   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  Taking all medications.    Labs (12/16): K 3.7, creatinine 0.8  PMH: 1. HTN: Not currently on meds.  2. Trigeminal neuralgia 3. TAH 4. Breast cancer: Diagnosed 12/16, ER+/PR+/HER2+.   - Echo (1/17) with EF 60-65%, lateral s' 11, GLS -23.1%, mild LVH, normal RV size and systolic function.  - Echo (4/17) with EF 60%, GLS -18.1%, normal RV size and systolic function.   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   FH: Father with lung cancer, grandmother died from MI.  HTN, DM.   ROS: All systems reviewed and negative except as per HPI.   Current Outpatient Prescriptions  Medication Sig Dispense Refill  . acetaminophen (TYLENOL) 325 MG tablet Take 650 mg by mouth every 6 (six) hours as needed. Reported on 02/15/2016    . cetirizine (ZYRTEC) 10 MG tablet Take 10 mg by mouth daily.    Mariane Baumgarten Sodium (COLACE PO) Take 1 tablet by mouth daily.    Marland Kitchen gabapentin (NEURONTIN) 600 MG tablet Take 1 tablet (600 mg total) by mouth 3 (three) times daily. (Patient taking differently: Take 600 mg by mouth at bedtime. ) 90 tablet 6  . lidocaine-prilocaine (EMLA) cream Apply 1 application topically as needed. 30 g 1  . Multiple Vitamins-Minerals (CENTRUM SILVER ADULT 50+ PO) Take by mouth.    . ondansetron (ZOFRAN ODT) 4 MG  disintegrating tablet Take 1 tablet (4 mg total) by mouth every 8 (eight) hours as needed for nausea or vomiting. 20 tablet 1  . senna (SENOKOT) 8.6 MG tablet Take 1 tablet by mouth daily. Reported on 03/07/2016     No current facility-administered medications for this encounter.   BP 136/76 mmHg  Pulse 104  Wt 289 lb (131.09 kg)  SpO2 96% General: NAD Neck: No JVD, no thyromegaly or thyroid nodule.  Lungs: Clear to auscultation bilaterally with normal respiratory effort. CV: Nondisplaced PMI.  Heart regular S1/S2, no S3/S4, no murmur.  No peripheral edema.  No carotid bruit.  Normal pedal pulses.  Abdomen: Soft, nontender, no hepatosplenomegaly, no distention.  Skin: Intact without lesions or rashes.  Neurologic: Alert and oriented x 3.  Psych: Normal affect. Extremities: No clubbing or cyanosis.  HEENT: Normal.   Assessment/Plan: 1. Breast cancer: Plan to begin Herceptin-based therapy.  We reviewed the approximately 10% risk of some degree of myocardial depression from Herceptin.   She will proceed with Herceptin, we will repeat an echo in 3 months with office visit.  2. HTN: There is mild LV hypertrophy on echo.  BP ok today  Follow up in 3 months with an ECHO   Amy Clegg NP-C  03/13/2016   Patient seen with NP, agree with the above note.  I reviewed today's echo.  EF and strain parameters remain within normal limits.  She will  continue Herceptin as planned and will return in 3 months for repeat echo and office visit.   Loralie Champagne 03/13/2016

## 2016-03-13 NOTE — Progress Notes (Signed)
Echocardiogram 2D Echocardiogram limited has been performed.  Vanessa [name] 03/13/2016, 9:43 AM

## 2016-03-13 NOTE — Patient Instructions (Signed)
Your physician recommends that you schedule a follow-up appointment in: 3 months with echocardiogram  

## 2016-03-14 ENCOUNTER — Other Ambulatory Visit (HOSPITAL_BASED_OUTPATIENT_CLINIC_OR_DEPARTMENT_OTHER): Payer: BLUE CROSS/BLUE SHIELD

## 2016-03-14 ENCOUNTER — Ambulatory Visit (HOSPITAL_BASED_OUTPATIENT_CLINIC_OR_DEPARTMENT_OTHER): Payer: BLUE CROSS/BLUE SHIELD | Admitting: Nurse Practitioner

## 2016-03-14 ENCOUNTER — Ambulatory Visit: Payer: BLUE CROSS/BLUE SHIELD

## 2016-03-14 ENCOUNTER — Encounter: Payer: Self-pay | Admitting: Nurse Practitioner

## 2016-03-14 ENCOUNTER — Encounter: Payer: Self-pay | Admitting: *Deleted

## 2016-03-14 ENCOUNTER — Ambulatory Visit (HOSPITAL_BASED_OUTPATIENT_CLINIC_OR_DEPARTMENT_OTHER): Payer: BLUE CROSS/BLUE SHIELD

## 2016-03-14 VITALS — BP 133/69 | HR 98 | Temp 98.2°F | Resp 18 | Wt 291.4 lb

## 2016-03-14 DIAGNOSIS — Z170 Estrogen receptor positive status [ER+]: Secondary | ICD-10-CM

## 2016-03-14 DIAGNOSIS — C50411 Malignant neoplasm of upper-outer quadrant of right female breast: Secondary | ICD-10-CM

## 2016-03-14 DIAGNOSIS — R202 Paresthesia of skin: Secondary | ICD-10-CM | POA: Diagnosis not present

## 2016-03-14 DIAGNOSIS — Z5111 Encounter for antineoplastic chemotherapy: Secondary | ICD-10-CM

## 2016-03-14 DIAGNOSIS — J351 Hypertrophy of tonsils: Secondary | ICD-10-CM | POA: Diagnosis not present

## 2016-03-14 DIAGNOSIS — Z95828 Presence of other vascular implants and grafts: Secondary | ICD-10-CM

## 2016-03-14 DIAGNOSIS — T451X5A Adverse effect of antineoplastic and immunosuppressive drugs, initial encounter: Secondary | ICD-10-CM

## 2016-03-14 DIAGNOSIS — G620 Drug-induced polyneuropathy: Secondary | ICD-10-CM

## 2016-03-14 DIAGNOSIS — J039 Acute tonsillitis, unspecified: Secondary | ICD-10-CM

## 2016-03-14 LAB — CBC WITH DIFFERENTIAL/PLATELET
BASO%: 1.1 % (ref 0.0–2.0)
BASOS ABS: 0.1 10*3/uL (ref 0.0–0.1)
EOS%: 1.9 % (ref 0.0–7.0)
Eosinophils Absolute: 0.1 10*3/uL (ref 0.0–0.5)
HEMATOCRIT: 35.8 % (ref 34.8–46.6)
HGB: 11.5 g/dL — ABNORMAL LOW (ref 11.6–15.9)
LYMPH#: 1.6 10*3/uL (ref 0.9–3.3)
LYMPH%: 34 % (ref 14.0–49.7)
MCH: 27.2 pg (ref 25.1–34.0)
MCHC: 32 g/dL (ref 31.5–36.0)
MCV: 85.1 fL (ref 79.5–101.0)
MONO#: 0.4 10*3/uL (ref 0.1–0.9)
MONO%: 7.6 % (ref 0.0–14.0)
NEUT#: 2.7 10*3/uL (ref 1.5–6.5)
NEUT%: 55.4 % (ref 38.4–76.8)
Platelets: 277 10*3/uL (ref 145–400)
RBC: 4.21 10*6/uL (ref 3.70–5.45)
RDW: 14.3 % (ref 11.2–14.5)
WBC: 4.8 10*3/uL (ref 3.9–10.3)

## 2016-03-14 LAB — COMPREHENSIVE METABOLIC PANEL
ALT: 16 U/L (ref 0–55)
AST: 18 U/L (ref 5–34)
Albumin: 3.2 g/dL — ABNORMAL LOW (ref 3.5–5.0)
Alkaline Phosphatase: 80 U/L (ref 40–150)
Anion Gap: 5 mEq/L (ref 3–11)
BUN: 10.9 mg/dL (ref 7.0–26.0)
CALCIUM: 8.7 mg/dL (ref 8.4–10.4)
CHLORIDE: 110 meq/L — AB (ref 98–109)
CO2: 27 mEq/L (ref 22–29)
Creatinine: 0.7 mg/dL (ref 0.6–1.1)
EGFR: >90 mL/min/{1.73_m2} (ref >90)
Glucose: 87 mg/dl (ref 70–140)
POTASSIUM: 3.6 meq/L (ref 3.5–5.1)
Sodium: 142 mEq/L (ref 136–145)
Total Bilirubin: 0.35 mg/dL (ref 0.20–1.20)
Total Protein: 6.7 g/dL (ref 6.4–8.3)

## 2016-03-14 MED ORDER — SODIUM CHLORIDE 0.9 % IV SOLN
Freq: Once | INTRAVENOUS | Status: AC
  Administered 2016-03-14: 15:00:00 via INTRAVENOUS
  Filled 2016-03-14: qty 4

## 2016-03-14 MED ORDER — CIPROFLOXACIN HCL 500 MG PO TABS: 500.0000 mg | ORAL_TABLET | Freq: Two times a day (BID) | ORAL | Status: DC

## 2016-03-14 MED ORDER — SODIUM CHLORIDE 0.9 % IJ SOLN
10.0000 mL | INTRAMUSCULAR | Status: DC | PRN
  Administered 2016-03-14: 10 mL
  Filled 2016-03-14: qty 10

## 2016-03-14 MED ORDER — SODIUM CHLORIDE 0.9 % IV SOLN
Freq: Once | INTRAVENOUS | Status: AC
  Administered 2016-03-14: 15:00:00 via INTRAVENOUS

## 2016-03-14 MED ORDER — HEPARIN SOD (PORK) LOCK FLUSH 100 UNIT/ML IV SOLN
500.0000 [IU] | Freq: Once | INTRAVENOUS | Status: AC | PRN
  Administered 2016-03-14: 500 [IU]
  Filled 2016-03-14: qty 5

## 2016-03-14 MED ORDER — PACLITAXEL PROTEIN-BOUND CHEMO INJECTION 100 MG
100.0000 mg/m2 | Freq: Once | INTRAVENOUS | Status: AC
  Administered 2016-03-14: 225 mg via INTRAVENOUS
  Filled 2016-03-14: qty 45

## 2016-03-14 MED ORDER — SODIUM CHLORIDE 0.9% FLUSH
10.0000 mL | INTRAVENOUS | Status: DC | PRN
  Administered 2016-03-14: 10 mL via INTRAVENOUS
  Filled 2016-03-14: qty 10

## 2016-03-14 NOTE — Patient Instructions (Signed)

## 2016-03-14 NOTE — Patient Instructions (Signed)

## 2016-03-14 NOTE — Progress Notes (Signed)
Vanessa [name]  Telephone:(336) [phone] Fax:(336) [phone]     ID: Vanessa [name] DOB: 1961/11/01  MR#: [id number]  NFA#:[id number]  Patient Care Team: Aretta Nip, MD as PCP - General (Family Medicine) Erroll Luna, MD as Consulting Physician (General Surgery) Chauncey Cruel, MD as Consulting Physician (Oncology) Marcial Pacas, MD as Consulting Physician (Neurology) Gean Birchwood, DPM as Consulting Physician (Podiatry) Servando Salina, MD as Consulting Physician (Obstetrics and Gynecology) Eppie Gibson, MD as Attending Physician (Radiation Oncology) Larey Dresser, MD as Consulting Physician (Cardiology) PCP: Aretta Nip, MD OTHER MD:  CHIEF COMPLAINT: Triple positive breast cancer  CURRENT TREATMENT:  Abraxane, trastuzumab  BREAST CANCER HISTORY: From the original intake note:  Tekela had screening mammography at Dr. cousins office on 11/06/2015 suggesting a change in the right breast. She was scheduled for right diagnostic mammography and tomosynthesis with ultrasonography at the [facility] 11/13/2015. The breast density was category B. In the upper outer quadrant of the right breast there was a small irregular mass with suspicious internal calcifications. By exam this was small, mobile, firm, and located at the 10:00 position 7 cm from the nipple. Ultrasound of the right breast confirmed an irregularly marginated hypoechoic mass measuring 1.1 cm. The right axilla was sonographically benign.  Biopsy of the right breast mass in question 11/15/2015 showed ([id number]) an invasive ductal carcinoma, grade 2, estrogen receptor 100% positive, progesterone receptor 2% positive, both with strong staining intensity, with an MIB-1 of 70%, and HER-2 amplification, the signals ratio being 4.92 and the number per cell 17.45.  The patient's subsequent history is as detailed below.  INTERVAL HISTORY: Vanessa [name] returns today for follow-up of her  HER-2 positive  breast cancer, accompanied by a friend. Today is day 1, cycle 8 of 12 planned weekly doses of abraxane. Trastuzumab is given every 3 weeks as well, next due on 03/28/16  REVIEW OF SYSTEMS: Vanessa [name] has some new symptoms this week. Her throat is sore, particularly on the left side. She denies fevers, chills, drainage, or sinus issues. It is not difficult to swallow, but she does feel mild discomfort when doing so. She is feeling some sensation changes to her fingertips. This comes and goes depending on the day. It does not impair her fine motor skills. There is no tingling to her toes. She was on more gabapentin, but just take '300mg'$  at night for sleep and pain to the right breast. She denies nausea, vomiting, changes in bowel or bladder habits. Her appetite is fair. A detailed review of systems is otherwise stable.  PAST MEDICAL HISTORY: Past Medical History  Diagnosis Date  . Allergy   . Arthritis   . Anemia   . Trigeminal neuralgia 05/2014  . Family history of breast cancer   . Family history of colon cancer   . Family history of thyroid cancer   . Hypertension     no meds now  . Anxiety   . PONV (postoperative nausea and vomiting)     PAST SURGICAL HISTORY: Past Surgical History  Procedure Laterality Date  . Breast surgery      biopsy  . Abdominal hysterectomy    . Meniscus repair      L knee  . Hysterotomy    . Eye surgery    . Breast lumpectomy with radioactive seed and sentinel lymph node biopsy Right 12/29/2015    Procedure: RIGHT BREAST  RADIOACTIVE SEED LOCALIZATION LUMPECTOMY AND RIGHT SENTINEL LYMPH NODE MAPPING;  Surgeon: Erroll Luna,  MD;  Location: [facility];  Service: General;  Laterality: Right;  . Portacath placement N/A 12/29/2015    Procedure: INSERTION PORT-A-CATH;  Surgeon: Erroll Luna, MD;  Location: [facility];  Service: General;  Laterality: N/A;    FAMILY HISTORY Family History  Problem Relation Age of Onset  . Diabetes  Mother   . Hypertension Mother   . Asthma Daughter   . Diabetes Sister   . Hypertension Sister   . Colon cancer Sister 7  . Thyroid cancer Sister 25  . Lung cancer Father   . Breast cancer Cousin     maternal first cousin  . Breast cancer Cousin 51    maternal first cousin - inflammatory   the patient's father died from lung cancer at the age of 68, in the setting of tobacco abuse. The patient's mother is living, at age 42. The patient had 2 brothers, 3 sisters. One sister was diagnosed with colon cancer in her 23s and later thyroid cancer. She died from metastatic disease. One brother died with pulmonary problems. On the maternal side a cousin was diagnosed with inflammatory breast cancer at the age of 38. A second cousin on the maternal side was diagnosed with breast cancer in her early 46s. There is no history of ovarian cancer in the family.  GYNECOLOGIC HISTORY:  No LMP recorded. Patient is postmenopausal. Menarche age 69, first live birth age 2, the patient is Vanessa [name] P2. She underwent simple hysterectomy with bilateral salpingectomy 06/28/2010, with benign pathology([id number]). She still has her ovaries in place. 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 She describes herself is single. At home she lives with her daughter Vanessa [name], who teaches theater at [employer] and is currently studying towards a Masters in counseling; and her son Vanessa [name], who is a professional basketball player in the international circuit (currently with the [employer]). The patient has no grandchildren. She attends a Tour manager    ADVANCED DIRECTIVES: Not in place. At the initial clinic visit 03/14/2016 the patient was given the appropriate forms to complete and notarize at her discretion.  HEALTH MAINTENANCE: Social History  Substance Use Topics  . Smoking  status: Never Smoker   . Smokeless tobacco: Never Used  . Alcohol Use: Yes     Comment: social     Colonoscopy: 2016/[facility]  PAP:  Bone density:  Lipid panel:  Allergies  Allergen Reactions  . Penicillins Anaphylaxis  . Cyclobenzaprine Swelling  . Hydrocodone Itching  . Meperidine Swelling  . Amoxicillin-Pot Clavulanate Rash  . Cefaclor Rash  . Codeine Rash  . Darvocet [Propoxyphene N-Acetaminophen] Rash  . Demerol Rash  . Erythromycin Rash  . Flexeril [Cyclobenzaprine Hcl] Rash  . Percocet [Oxycodone-Acetaminophen] Itching and Rash    Red streaks come up on skin  . Sulfa Antibiotics Rash    Current Outpatient Prescriptions  Medication Sig Dispense Refill  . Docusate Sodium (COLACE PO) Take 1 tablet by mouth daily.    Marland Kitchen gabapentin (NEURONTIN) 600 MG tablet Take 1 tablet (600 mg total) by mouth 3 (three) times daily. (Patient taking differently: Take 600 mg by mouth at bedtime. ) 90 tablet 6  . lidocaine-prilocaine (EMLA) cream Apply 1 application topically as needed. 30 g 1  . ondansetron (ZOFRAN ODT) 4 MG disintegrating tablet Take 1 tablet (4 mg total) by mouth every 8 (eight) hours as needed for  nausea or vomiting. 20 tablet 1  . acetaminophen (TYLENOL) 325 MG tablet Take 650 mg by mouth every 6 (six) hours as needed. Reported on 03/14/2016    . cetirizine (ZYRTEC) 10 MG tablet Take 10 mg by mouth daily. Reported on 03/14/2016    . ciprofloxacin (CIPRO) 500 MG tablet Take 1 tablet (500 mg total) by mouth 2 (two) times daily. 10 tablet 0  . Multiple Vitamins-Minerals (CENTRUM SILVER ADULT 50+ PO) Take by mouth. Reported on 03/14/2016    . senna (SENOKOT) 8.6 MG tablet Take 1 tablet by mouth daily. Reported on 03/14/2016     No current facility-administered medications for this visit.    OBJECTIVE: Middle-aged African-American woman  Filed Vitals:   03/14/16 1319  BP: 133/69  Pulse: 98  Temp: 98.2 F (36.8 C)  Resp: 18     Body mass index is 49.99 kg/(m^2).    ECOG FS:1 -  Symptomatic but completely ambulatory  Skin: warm, dry  HEENT: sclerae anicteric, conjunctivae pink. Bilateral tonsils enlarged. Small white patch over left tonsil. Lymph Nodes: No cervical or supraclavicular lymphadenopathy  Lungs: clear to auscultation bilaterally, no rales, wheezes, or rhonci  Heart: regular rate and rhythm  Abdomen: round, soft, non tender, positive bowel sounds  Musculoskeletal: No focal spinal tenderness, no peripheral edema  Neuro: non focal, well oriented, positive affect  Breasts: deferred  LAB RESULTS:  CMP     Component Value Date/Time   NA 142 03/14/2016 1237   NA 141 12/20/2015 1120   NA 143 04/25/2015 1148   K 3.6 03/14/2016 1237   K 3.3* 12/20/2015 1120   CL 106 12/20/2015 1120   CO2 27 03/14/2016 1237   CO2 28 12/20/2015 1120   GLUCOSE 87 03/14/2016 1237   GLUCOSE 100* 12/20/2015 1120   GLUCOSE 89 04/25/2015 1148   BUN 10.9 03/14/2016 1237   BUN 12 12/20/2015 1120   BUN 13 04/25/2015 1148   CREATININE 0.7 03/14/2016 1237   CREATININE 0.73 12/20/2015 1120   CREATININE 0.72 05/25/2014 1112   CALCIUM 8.7 03/14/2016 1237   CALCIUM 9.1 12/20/2015 1120   PROT 6.7 03/14/2016 1237   PROT 7.4 12/20/2015 1120   ALBUMIN 3.2* 03/14/2016 1237   ALBUMIN 3.4* 12/20/2015 1120   AST 18 03/14/2016 1237   AST 19 12/20/2015 1120   ALT 16 03/14/2016 1237   ALT 14 12/20/2015 1120   ALKPHOS 80 03/14/2016 1237   ALKPHOS 105 12/20/2015 1120   BILITOT 0.35 03/14/2016 1237   BILITOT 0.3 12/20/2015 1120   GFRNONAA >60 12/20/2015 1120   GFRAA >60 12/20/2015 1120    INo results found for: SPEP, UPEP  Lab Results  Component Value Date   WBC 4.8 03/14/2016   NEUTROABS 2.7 03/14/2016   HGB 11.5* 03/14/2016   HCT 35.8 03/14/2016   MCV 85.1 03/14/2016   PLT 277 03/14/2016      Chemistry      Component Value Date/Time   NA 142 03/14/2016 1237   NA 141 12/20/2015 1120   NA 143 04/25/2015 1148   K 3.6 03/14/2016 1237   K 3.3* 12/20/2015 1120   CL 106  12/20/2015 1120   CO2 27 03/14/2016 1237   CO2 28 12/20/2015 1120   BUN 10.9 03/14/2016 1237   BUN 12 12/20/2015 1120   BUN 13 04/25/2015 1148   CREATININE 0.7 03/14/2016 1237   CREATININE 0.73 12/20/2015 1120   CREATININE 0.72 05/25/2014 1112      Component Value Date/Time  CALCIUM 8.7 03/14/2016 1237   CALCIUM 9.1 12/20/2015 1120   ALKPHOS 80 03/14/2016 1237   ALKPHOS 105 12/20/2015 1120   AST 18 03/14/2016 1237   AST 19 12/20/2015 1120   ALT 16 03/14/2016 1237   ALT 14 12/20/2015 1120   BILITOT 0.35 03/14/2016 1237   BILITOT 0.3 12/20/2015 1120       No results found for: LABCA2  No components found for: LABCA125  No results for input(s): INR in the last 168 hours.  Urinalysis No results found for: COLORURINE, APPEARANCEUR, LABSPEC, PHURINE, GLUCOSEU, HGBUR, BILIRUBINUR, KETONESUR, PROTEINUR, UROBILINOGEN, NITRITE, LEUKOCYTESUR  STUDIES: No results found.  ASSESSMENT: 55 y.o. [place] woman status post right breast upper outer quadrant biopsy 11/15/2015 for a clinical T1c N0, stage IA invasive ductal carcinoma, grade 2, estrogen and progesterone receptor positive, HER-2 amplified, with an MIB-1 of 70%  (1) Genetics testing 12/22/2015 through the Hereditary Gene Panel offered by [facility] found no deleterious mutations in  APC, ATM, AXIN2, BARD1, BMPR1A, BRCA1, BRCA2, BRIP1, CDH1, CDKN2A, CHEK2, DICER1, EPCAM, GREM1, KIT, MEN1, MLH1, MSH2, MSH6, MUTYH, NBN, NF1, PALB2, PDGFRA, PMS2, POLD1, POLE, PTEN, RAD50, RAD51C, RAD51D, SDHA, SDHB, SDHC, SDHD, SMAD4, SMARCA4. STK11, TP53, TSC1, TSC2, and VHL.  (2) right lumpectomy with sentinel lymph node sampling 12/29/2015 showed a pT1c pN0, stage IA invasive ductal carcinoma, grade 3, with negative though close margins (76m to DCIS)  (3) adjuvant chemotherapy with Abraxane weekly 12 with trastuzumab every 3 weeks to start 01/18/2016   (4) trastuzumab to be continued to total one year  (a) echo 12/18/2015 shows an EF of  65-70%  (5) adjuvant radiation to follow chemotherapy  (6) anti-estrogens to follow radiation  PLAN: KJalyricis starting to demonstrate intermittent peripheral neuropathy symptoms. At present they are intermittent, but she understands we have to tread cautiously. She is already on gabapentin nightly. She will proceed with cycle 8 of abraxane as planned today.   Her tonsils are enlarged. To be safe I am putting her on a small course of cipro. She is allergic to cephalosporins, penicillins, and sulfa antibiotics.  KKrystenwill return in 1 week for cycle 9 of abraxane. She understands and agrees with this plan. She knows the goal of treatment in her case is cure. She has been encouraged to call with any issues that might arise before her next visit here.   HLaurie Panda NP   03/14/2016 2:16 PM

## 2016-03-21 ENCOUNTER — Ambulatory Visit (HOSPITAL_BASED_OUTPATIENT_CLINIC_OR_DEPARTMENT_OTHER): Payer: BLUE CROSS/BLUE SHIELD | Admitting: Nurse Practitioner

## 2016-03-21 ENCOUNTER — Encounter: Payer: Self-pay | Admitting: Nurse Practitioner

## 2016-03-21 ENCOUNTER — Other Ambulatory Visit (HOSPITAL_BASED_OUTPATIENT_CLINIC_OR_DEPARTMENT_OTHER): Payer: BLUE CROSS/BLUE SHIELD

## 2016-03-21 ENCOUNTER — Ambulatory Visit (HOSPITAL_BASED_OUTPATIENT_CLINIC_OR_DEPARTMENT_OTHER): Payer: BLUE CROSS/BLUE SHIELD

## 2016-03-21 ENCOUNTER — Encounter: Payer: Self-pay | Admitting: *Deleted

## 2016-03-21 ENCOUNTER — Telehealth: Payer: Self-pay | Admitting: Oncology

## 2016-03-21 VITALS — BP 143/73 | HR 107 | Temp 97.7°F | Resp 18 | Ht 64.0 in | Wt 297.4 lb

## 2016-03-21 DIAGNOSIS — C50411 Malignant neoplasm of upper-outer quadrant of right female breast: Secondary | ICD-10-CM | POA: Diagnosis not present

## 2016-03-21 DIAGNOSIS — G620 Drug-induced polyneuropathy: Secondary | ICD-10-CM

## 2016-03-21 DIAGNOSIS — Z5111 Encounter for antineoplastic chemotherapy: Secondary | ICD-10-CM

## 2016-03-21 DIAGNOSIS — Z170 Estrogen receptor positive status [ER+]: Secondary | ICD-10-CM

## 2016-03-21 DIAGNOSIS — Z95828 Presence of other vascular implants and grafts: Secondary | ICD-10-CM

## 2016-03-21 DIAGNOSIS — T451X5A Adverse effect of antineoplastic and immunosuppressive drugs, initial encounter: Secondary | ICD-10-CM

## 2016-03-21 LAB — CBC WITH DIFFERENTIAL/PLATELET
BASO%: 0.6 % (ref 0.0–2.0)
BASOS ABS: 0 10*3/uL (ref 0.0–0.1)
EOS ABS: 0.1 10*3/uL (ref 0.0–0.5)
EOS%: 1.3 % (ref 0.0–7.0)
HCT: 34.8 % (ref 34.8–46.6)
HEMOGLOBIN: 11.3 g/dL — AB (ref 11.6–15.9)
LYMPH%: 32.3 % (ref 14.0–49.7)
MCH: 27.8 pg (ref 25.1–34.0)
MCHC: 32.5 g/dL (ref 31.5–36.0)
MCV: 85.5 fL (ref 79.5–101.0)
MONO#: 0.4 10*3/uL (ref 0.1–0.9)
MONO%: 7 % (ref 0.0–14.0)
NEUT%: 58.8 % (ref 38.4–76.8)
NEUTROS ABS: 3.1 10*3/uL (ref 1.5–6.5)
PLATELETS: 252 10*3/uL (ref 145–400)
RBC: 4.07 10*6/uL (ref 3.70–5.45)
RDW: 15 % — ABNORMAL HIGH (ref 11.2–14.5)
WBC: 5.3 10*3/uL (ref 3.9–10.3)
lymph#: 1.7 10*3/uL (ref 0.9–3.3)

## 2016-03-21 LAB — COMPREHENSIVE METABOLIC PANEL
ALK PHOS: 81 U/L (ref 40–150)
ALT: 12 U/L (ref 0–55)
ANION GAP: 8 meq/L (ref 3–11)
AST: 17 U/L (ref 5–34)
Albumin: 3.1 g/dL — ABNORMAL LOW (ref 3.5–5.0)
BILIRUBIN TOTAL: 0.33 mg/dL (ref 0.20–1.20)
BUN: 10.4 mg/dL (ref 7.0–26.0)
CO2: 27 mEq/L (ref 22–29)
Calcium: 8.8 mg/dL (ref 8.4–10.4)
Chloride: 106 mEq/L (ref 98–109)
Creatinine: 0.7 mg/dL (ref 0.6–1.1)
GLUCOSE: 128 mg/dL (ref 70–140)
POTASSIUM: 3.4 meq/L — AB (ref 3.5–5.1)
SODIUM: 141 meq/L (ref 136–145)
TOTAL PROTEIN: 6.3 g/dL — AB (ref 6.4–8.3)

## 2016-03-21 MED ORDER — HEPARIN SOD (PORK) LOCK FLUSH 100 UNIT/ML IV SOLN
500.0000 [IU] | Freq: Once | INTRAVENOUS | Status: AC | PRN
  Administered 2016-03-21: 500 [IU]
  Filled 2016-03-21: qty 5

## 2016-03-21 MED ORDER — SODIUM CHLORIDE 0.9% FLUSH
10.0000 mL | INTRAVENOUS | Status: DC | PRN
  Administered 2016-03-21: 10 mL via INTRAVENOUS
  Filled 2016-03-21: qty 10

## 2016-03-21 MED ORDER — SODIUM CHLORIDE 0.9 % IV SOLN
Freq: Once | INTRAVENOUS | Status: AC
  Administered 2016-03-21: 16:00:00 via INTRAVENOUS

## 2016-03-21 MED ORDER — SODIUM CHLORIDE 0.9 % IV SOLN
Freq: Once | INTRAVENOUS | Status: AC
  Administered 2016-03-21: 16:00:00 via INTRAVENOUS
  Filled 2016-03-21: qty 4

## 2016-03-21 MED ORDER — SODIUM CHLORIDE 0.9 % IJ SOLN
10.0000 mL | INTRAMUSCULAR | Status: DC | PRN
  Administered 2016-03-21: 10 mL
  Filled 2016-03-21: qty 10

## 2016-03-21 MED ORDER — PACLITAXEL PROTEIN-BOUND CHEMO INJECTION 100 MG
100.0000 mg/m2 | Freq: Once | INTRAVENOUS | Status: AC
  Administered 2016-03-21: 225 mg via INTRAVENOUS
  Filled 2016-03-21: qty 45

## 2016-03-21 NOTE — Patient Instructions (Signed)
[facility] Discharge Instructions for Patients Receiving Chemotherapy  Today you received the following chemotherapy agents: Abraxane.  To help prevent nausea and vomiting after your treatment, we encourage you to take your nausea medication: Zofran 4 mg every 8 hours as needed.   If you develop nausea and vomiting that is not controlled by your nausea medication, call the clinic.   BELOW ARE SYMPTOMS THAT SHOULD BE REPORTED IMMEDIATELY:  *FEVER GREATER THAN 100.5 F  *CHILLS WITH OR WITHOUT FEVER  NAUSEA AND VOMITING THAT IS NOT CONTROLLED WITH YOUR NAUSEA MEDICATION  *UNUSUAL SHORTNESS OF BREATH  *UNUSUAL BRUISING OR BLEEDING  TENDERNESS IN MOUTH AND THROAT WITH OR WITHOUT PRESENCE OF ULCERS  *URINARY PROBLEMS  *BOWEL PROBLEMS  UNUSUAL RASH Items with * indicate a potential emergency and should be followed up as soon as possible.  Feel free to call the clinic you have any questions or concerns. The clinic phone number is [phone].  Please show the [facility] at check-in to the Emergency Department and triage nurse.

## 2016-03-21 NOTE — Progress Notes (Signed)
[place]  [phone] [phone]     ID: Vanessa [name] DOB: 1961/02/08  MR#: [id number]  FYB#:[id number]  Patient Care Team: Aretta Nip, MD as PCP - General (Family Medicine) Erroll Luna, MD as Consulting Physician (General Surgery) Chauncey Cruel, MD as Consulting Physician (Oncology) Marcial Pacas, MD as Consulting Physician (Neurology) Gean Birchwood, DPM as Consulting Physician (Podiatry) Servando Salina, MD as Consulting Physician (Obstetrics and Gynecology) Eppie Gibson, MD as Attending Physician (Radiation Oncology) Larey Dresser, MD as Consulting Physician (Cardiology) PCP: Aretta Nip, MD OTHER MD:  CHIEF COMPLAINT: Triple positive breast cancer  CURRENT TREATMENT:  Abraxane, trastuzumab  BREAST CANCER HISTORY: From the original intake note:  Vanessa [name] had screening mammography at Dr. cousins office on 11/06/2015 suggesting a change in the right breast. She was scheduled for right diagnostic mammography and tomosynthesis with ultrasonography at the [facility] 11/13/2015. The breast density was category B. In the upper outer quadrant of the right breast there was a small irregular mass with suspicious internal calcifications. By exam this was small, mobile, firm, and located at the 10:00 position 7 cm from the nipple. Ultrasound of the right breast confirmed an irregularly marginated hypoechoic mass measuring 1.1 cm. The right axilla was sonographically benign.  Biopsy of the right breast mass in question 11/15/2015 showed ([id number]) an invasive ductal carcinoma, grade 2, estrogen receptor 100% positive, progesterone receptor 2% positive, both with strong staining intensity, with an MIB-1 of 70%, and HER-2 amplification, the signals ratio being 4.92 and the number per cell 17.45.  The patient's subsequent history is as detailed below.  INTERVAL HISTORY: Vanessa [name] returns today for follow-up of her  HER-2 positive  breast cancer, accompanied by a friend. Today is day 1, cycle 9 of 12 planned weekly doses of abraxane. Trastuzumab is given every 3 weeks as well, next due on 03/28/16  REVIEW OF SYSTEMS: Vanessa [name] completed her course of cipro and her tonsils are much improved. She did have a severe wave of dizziness and nausea yesterday for the first time during this course of treatment. She does not think it had anything to do with the antibiotic since she was done taking it by then. She feels better today. She denies fevers or chills. She is moving her bowels well. She continues on gabapentin for her right breast pain and sleep at night. She has had intermittent numbness and tingling to her hands for the past few days, but none today. It does not impair her fine motor skills such as writing, wearing jewelery, or buttoning her shirts. A detailed review of systems is otherwise stable.  PAST MEDICAL HISTORY: Past Medical History  Diagnosis Date  . Allergy   . Arthritis   . Anemia   . Trigeminal neuralgia 05/2014  . Family history of breast cancer   . Family history of colon cancer   . Family history of thyroid cancer   . Hypertension     no meds now  . Anxiety   . PONV (postoperative nausea and vomiting)     PAST SURGICAL HISTORY: Past Surgical History  Procedure Laterality Date  . Breast surgery      biopsy  . Abdominal hysterectomy    . Meniscus repair      L knee  . Hysterotomy    . Eye surgery    . Breast lumpectomy with radioactive seed and sentinel lymph node biopsy Right 12/29/2015    Procedure: RIGHT BREAST  RADIOACTIVE SEED LOCALIZATION  LUMPECTOMY AND RIGHT SENTINEL LYMPH NODE MAPPING;  Surgeon: Erroll Luna, MD;  Location: [facility];  Service: General;  Laterality: Right;  . Portacath placement N/A 12/29/2015    Procedure: INSERTION PORT-A-CATH;  Surgeon: Erroll Luna, MD;  Location: [facility];  Service: General;  Laterality: N/A;    FAMILY HISTORY Family  History  Problem Relation Age of Onset  . Diabetes Mother   . Hypertension Mother   . Asthma Daughter   . Diabetes Sister   . Hypertension Sister   . Colon cancer Sister 46  . Thyroid cancer Sister 54  . Lung cancer Father   . Breast cancer Cousin     maternal first cousin  . Breast cancer Cousin 82    maternal first cousin - inflammatory   the patient's father died from lung cancer at the age of 90, in the setting of tobacco abuse. The patient's mother is living, at age 69. The patient had 2 brothers, 3 sisters. One sister was diagnosed with colon cancer in her 19s and later thyroid cancer. She died from metastatic disease. One brother died with pulmonary problems. On the maternal side a cousin was diagnosed with inflammatory breast cancer at the age of 66. A second cousin on the maternal side was diagnosed with breast cancer in her early 71s. There is no history of ovarian cancer in the family.  GYNECOLOGIC HISTORY:  No LMP recorded. Patient is postmenopausal. Menarche age 29, first live birth age 23, the patient is Vanessa [name]. She underwent simple hysterectomy with bilateral salpingectomy 06/28/2010, with benign pathology([id number]). She still has her ovaries in place. 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 She describes herself is single. At home she lives with her daughter Vanessa [name], who teaches theater at [employer] middle school and is currently studying towards a Masters in counseling; and her son Vanessa [name], who is a professional basketball player in the international circuit (currently with the [employer]). The patient has no grandchildren. She attends a Tour manager    ADVANCED DIRECTIVES: Not in place. At the initial clinic visit 03/21/2016 the patient was given the appropriate forms to complete and notarize at her discretion.  HEALTH  MAINTENANCE: Social History  Substance Use Topics  . Smoking status: Never Smoker   . Smokeless tobacco: Never Used  . Alcohol Use: Yes     Comment: social     Colonoscopy: 2016/[facility]  PAP:  Bone density:  Lipid panel:  Allergies  Allergen Reactions  . Penicillins Anaphylaxis  . Cyclobenzaprine Swelling  . Hydrocodone Itching  . Meperidine Swelling  . Amoxicillin-Pot Clavulanate Rash  . Cefaclor Rash  . Codeine Rash  . Darvocet [Propoxyphene N-Acetaminophen] Rash  . Demerol Rash  . Erythromycin Rash  . Flexeril [Cyclobenzaprine Hcl] Rash  . Percocet [Oxycodone-Acetaminophen] Itching and Rash    Red streaks come up on skin  . Sulfa Antibiotics Rash    Current Outpatient Prescriptions  Medication Sig Dispense Refill  . cetirizine (ZYRTEC) 10 MG tablet Take 10 mg by mouth daily. Reported on 03/14/2016    . Docusate Sodium (COLACE PO) Take 1 tablet by mouth daily.    Marland Kitchen gabapentin (NEURONTIN) 600 MG tablet Take 1 tablet (600 mg total) by mouth 3 (three) times daily. (Patient taking differently: Take 600 mg by mouth at bedtime. ) 90 tablet 6  . lidocaine-prilocaine (EMLA) cream Apply 1 application topically  as needed. 30 g 1  . Multiple Vitamins-Minerals (CENTRUM SILVER ADULT 50+ PO) Take by mouth. Reported on 03/14/2016    . ondansetron (ZOFRAN ODT) 4 MG disintegrating tablet Take 1 tablet (4 mg total) by mouth every 8 (eight) hours as needed for nausea or vomiting. 20 tablet 1  . senna (SENOKOT) 8.6 MG tablet Take 1 tablet by mouth daily. Reported on 03/14/2016    . acetaminophen (TYLENOL) 325 MG tablet Take 650 mg by mouth every 6 (six) hours as needed. Reported on 03/21/2016     No current facility-administered medications for this visit.    OBJECTIVE: Middle-aged African-American woman  Filed Vitals:   03/21/16 1433  BP: 143/73  Pulse: 107  Temp: 97.7 F (36.5 C)  Resp: 18     Body mass index is 51.02 kg/(m^2).    ECOG FS:1 - Symptomatic but completely  ambulatory  Sclerae unicteric, pupils round and equal Oropharynx clear and moist-- no thrush or other lesions. Tonsils normal sized. No cervical or supraclavicular adenopathy Lungs no rales or rhonchi Heart regular rate and rhythm Abd soft, nontender, positive bowel sounds MSK no focal spinal tenderness, no upper extremity lymphedema Neuro: nonfocal, well oriented, appropriate affect Breasts: deferred  LAB RESULTS:  CMP     Component Value Date/Time   NA 141 03/21/2016 1322   NA 141 12/20/2015 1120   NA 143 04/25/2015 1148   K 3.4* 03/21/2016 1322   K 3.3* 12/20/2015 1120   CL 106 12/20/2015 1120   CO2 27 03/21/2016 1322   CO2 28 12/20/2015 1120   GLUCOSE 128 03/21/2016 1322   GLUCOSE 100* 12/20/2015 1120   GLUCOSE 89 04/25/2015 1148   BUN 10.4 03/21/2016 1322   BUN 12 12/20/2015 1120   BUN 13 04/25/2015 1148   CREATININE 0.7 03/21/2016 1322   CREATININE 0.73 12/20/2015 1120   CREATININE 0.72 05/25/2014 1112   CALCIUM 8.8 03/21/2016 1322   CALCIUM 9.1 12/20/2015 1120   PROT 6.3* 03/21/2016 1322   PROT 7.4 12/20/2015 1120   ALBUMIN 3.1* 03/21/2016 1322   ALBUMIN 3.4* 12/20/2015 1120   AST 17 03/21/2016 1322   AST 19 12/20/2015 1120   ALT 12 03/21/2016 1322   ALT 14 12/20/2015 1120   ALKPHOS 81 03/21/2016 1322   ALKPHOS 105 12/20/2015 1120   BILITOT 0.33 03/21/2016 1322   BILITOT 0.3 12/20/2015 1120   GFRNONAA >60 12/20/2015 1120   GFRAA >60 12/20/2015 1120    INo results found for: SPEP, UPEP  Lab Results  Component Value Date   WBC 5.3 03/21/2016   NEUTROABS 3.1 03/21/2016   HGB 11.3* 03/21/2016   HCT 34.8 03/21/2016   MCV 85.5 03/21/2016   PLT 252 03/21/2016      Chemistry      Component Value Date/Time   NA 141 03/21/2016 1322   NA 141 12/20/2015 1120   NA 143 04/25/2015 1148   K 3.4* 03/21/2016 1322   K 3.3* 12/20/2015 1120   CL 106 12/20/2015 1120   CO2 27 03/21/2016 1322   CO2 28 12/20/2015 1120   BUN 10.4 03/21/2016 1322   BUN 12  12/20/2015 1120   BUN 13 04/25/2015 1148   CREATININE 0.7 03/21/2016 1322   CREATININE 0.73 12/20/2015 1120   CREATININE 0.72 05/25/2014 1112      Component Value Date/Time   CALCIUM 8.8 03/21/2016 1322   CALCIUM 9.1 12/20/2015 1120   ALKPHOS 81 03/21/2016 1322   ALKPHOS 105 12/20/2015 1120   AST 17  03/21/2016 1322   AST 19 12/20/2015 1120   ALT 12 03/21/2016 1322   ALT 14 12/20/2015 1120   BILITOT 0.33 03/21/2016 1322   BILITOT 0.3 12/20/2015 1120       No results found for: LABCA2  No components found for: LABCA125  No results for input(s): INR in the last 168 hours.  Urinalysis No results found for: COLORURINE, APPEARANCEUR, LABSPEC, PHURINE, GLUCOSEU, HGBUR, BILIRUBINUR, KETONESUR, PROTEINUR, UROBILINOGEN, NITRITE, LEUKOCYTESUR  STUDIES: No results found.  ASSESSMENT: 55 y.o. Vanessa [name] woman status post right breast upper outer quadrant biopsy 11/15/2015 for a clinical T1c N0, stage IA invasive ductal carcinoma, grade 2, estrogen and progesterone receptor positive, HER-2 amplified, with an MIB-1 of 70%  (1) Genetics testing 12/22/2015 through the Hereditary Gene Panel offered by [facility] found no deleterious mutations in  APC, ATM, AXIN2, BARD1, BMPR1A, BRCA1, BRCA2, BRIP1, CDH1, CDKN2A, CHEK2, DICER1, EPCAM, GREM1, KIT, MEN1, MLH1, MSH2, MSH6, MUTYH, NBN, NF1, PALB2, PDGFRA, PMS2, POLD1, POLE, PTEN, RAD50, RAD51C, RAD51D, SDHA, SDHB, SDHC, SDHD, SMAD4, SMARCA4. STK11, TP53, TSC1, TSC2, and VHL.  (2) right lumpectomy with sentinel lymph node sampling 12/29/2015 showed a pT1c pN0, stage IA invasive ductal carcinoma, grade 3, with negative though close margins (46m to DCIS)  (3) adjuvant chemotherapy with Abraxane weekly 12 with trastuzumab every 3 weeks to start 01/18/2016   (4) trastuzumab to be continued to total one year  (a) echo 12/18/2015 shows an EF of 65-70%  (5) adjuvant radiation to follow chemotherapy  (6) anti-estrogens to follow  radiation  PLAN: KArmoniihad a severe wave of nausea yesterday, but is otherwise unchanged as compared to previous weeks. The labs were reviewed in detail and were stable. We will continue to monitor her neuropathy symptoms, which are not present on today's exam. She will proceed with cycle 9 of abraxane as planned today.  I have placed a referral to Dr. SPearlie Oysteroffice for a radiation consultation visit as we near the end of this chemotherapy regimen.   KAnaylawill return in 1 week for cycle 10 of abraxane and her next dose of trastuzumab. She understands and agrees with this plan. She knows the goal of treatment in her case is cure. She has been encouraged to call with any issues that might arise before her next visit here.   HLaurie Panda NP   03/21/2016 3:15 PM

## 2016-03-21 NOTE — Patient Instructions (Signed)

## 2016-03-21 NOTE — Telephone Encounter (Signed)
Gave patient avs report for April thru June. Spoke with Vanessa [name] in [place] will contact patient re appointment - patient aware.

## 2016-03-22 NOTE — Progress Notes (Addendum)
Location of Breast Cancer: Right Breast  Histology per Pathology Report:  11/15/15 Diagnosis Breast, right, needle core biopsy, 10:00 o'clock - INVASIVE DUCTAL CARCINOMA. - DUCTAL CARCINOMA IN SITU. - ASSOCIATED CALCIFICATION.  12/29/15 Diagnosis 1. Breast, lumpectomy, right - INVASIVE DUCTAL CARCINOMA, SEE COMMENT. - NEGATIVE FOR LYMPH VASCULAR INVASION. - INVASIVE TUMOR IS 0.5 MM FROM NEAREST MARGIN (ANTERIOR). - HIGH GRADE DUCTAL CARCINOMA IN SITU WITH NECROSIS. - IN SITU CARCINOMA IS 1 MM FROM NEAREST MARGIN (ANTERIOR). - PREVIOUS BIOPSY SITE TISSUE CHANGES. - SEE TUMOR SYNOPTIC TEMPLATE BELOW. 2. Breast, excision, right additional anterior margin - BENIGN BREAST TISSUE SEE COMMENT. - NEGATIVE FOR ATYPIA AND MALIGNANCY. - MICROCALCIFICATIONS PRESENT. - SURGICAL MARGIN, NEGATIVE FOR ATYPIA AND MALIGNANCY. 3. Breast, excision, right additional posterior margin - BENIGN BREAST TISSUE. - NEGATIVE FOR ATYPIA AND MALIGNANCY. - MICROCALCIFICATIONS PRESENT. - SURGICAL MARGIN, NEGATIVE FOR ATYPIA AND MALIGNANCY. 4. Lymph node, sentinel, biopsy, right axillary - ONE LYMPH NODE, NEGATIVE FOR TUMOR (0/1).  Receptor Status: ER(100%), PR (2%), Her2-neu (POS), Ki-(70%)  Did patient present with symptoms or was this found on screening mammography?: It was found on a screening mammogram.   Past/Anticipated interventions by surgeon, if any: Dr. Brantley Stage 12/29/15 Right breast seed localized partial mastectomy with right axillary sentinel lymph node mapping and placement of right internal jugular 8 French Port-A-Cath under fluoroscopic and ultrasonic guidance  Past/Anticipated interventions by medical oncology, if any:  Dr. Jana Hakim 02/08/16  1. adjuvant chemotherapy with Abraxane weekly 12 with trastuzumab every 3 weeks which started 01/18/2016    2. trastuzumab to be continued to total one year every 3 weeks.   3. adjuvant radiation to follow chemotherapy   4. anti-estrogens  to follow radiation  Lymphedema issues, if any: No  Pain issues, if any: No  SAFETY ISSUES:  Prior radiation?  No  Pacemaker/ICD? No  Possible current pregnancy? No  Is the patient on methotrexate? No  Current Complaints / other details:   GYNECOLOGIC HISTORY:  No LMP recorded. Patient is postmenopausal. Menarche age 69, first live birth age 28, the patient is [place] P2. She underwent simple hysterectomy with bilateral salpingectomy 06/28/2010, with benign pathology([id number]). She still has her ovaries in place. She took oral contraceptives remotely with no complications  BP 809/98 mmHg  Pulse 101  Temp(Src) 98.3 F (36.8 C)  Ht _0  (1.626 m)  Wt 295 lb 12.8 oz (134.174 kg)  BMI 50.75 kg/m2    Mance Vallejo, Stephani Police, RN 03/22/2016,2:38 PM

## 2016-03-27 ENCOUNTER — Ambulatory Visit
Admission: RE | Admit: 2016-03-27 | Discharge: 2016-03-27 | Disposition: A | Discharge status: Home / Self Care | Payer: BLUE CROSS/BLUE SHIELD | Source: Ambulatory Visit | Attending: Radiation Oncology | Admitting: Radiation Oncology

## 2016-03-27 ENCOUNTER — Encounter: Payer: Self-pay | Admitting: Radiation Oncology

## 2016-03-27 VITALS — BP 126/80 | HR 101 | Temp 98.3°F | Ht 64.0 in | Wt 295.8 lb

## 2016-03-27 DIAGNOSIS — Z90710 Acquired absence of both cervix and uterus: Secondary | ICD-10-CM | POA: Insufficient documentation

## 2016-03-27 DIAGNOSIS — Z510 Encounter for antineoplastic radiation therapy: Secondary | ICD-10-CM | POA: Insufficient documentation

## 2016-03-27 DIAGNOSIS — C50411 Malignant neoplasm of upper-outer quadrant of right female breast: Secondary | ICD-10-CM | POA: Diagnosis not present

## 2016-03-27 DIAGNOSIS — Z170 Estrogen receptor positive status [ER+]: Secondary | ICD-10-CM | POA: Insufficient documentation

## 2016-03-27 NOTE — Addendum Note (Signed)
Encounter addended by: Ernst Spell, RN on: 03/27/2016 11:26 AM<BR>     Documentation filed: Charges VN

## 2016-03-27 NOTE — Progress Notes (Signed)
Radiation Oncology         [phone] ________________________________  Name: Vanessa [name] MRN: [id number]  Date: 03/27/2016  DOB: 1961/08/24  Follow-Up Visit Note  Outpatient  CC: Aretta Nip, MD  Erroll Luna, MD  Diagnosis:      ICD-9-CM ICD-10-CM   1. Breast cancer of upper-outer quadrant of right female breast ([place]) 174.4 C50.411    T1cN0M0 Stage I right breast invasive ductal carcinoma with DCIS, ER100%/PR2% ; HER2positive, Grade 3  Narrative:  The patient returns today for follow-up.   Since consultation, she underwent right lumpectomy and SLN bx ( on 12/29/15) by Dr Brantley Stage.  The tumor was 1.2cm with histology as above. There is a close anterior margin to invasive disease as well as DCIS anteriorly. One SLN is negative.      Past/Anticipated interventions by medical oncology, if any:  Dr. Jana Hakim 02/08/16 1. adjuvant chemotherapy with Abraxane weekly 12 with trastuzumab every 3 weeks which started 01/18/2016  2. trastuzumab to be continued to total one year every 3 weeks. 3.  anti-estrogens to follow radiation  (She has 3 more cycles of Abraxane remaining).  Lymphedema issues, if any: No  Pain issues, if any: No  SAFETY ISSUES:  Prior radiation?  No  Pacemaker/ICD? No  Possible current pregnancy? No  Is the patient on methotrexate? No  Current Complaints / other details:   GYNECOLOGIC HISTORY:  No LMP recorded. Patient is postmenopausal. Menarche age 71, first live birth age 35, the patient is [place] P2. She underwent simple hysterectomy with bilateral salpingectomy 06/28/2010, with benign pathology([id number]). She still has her ovaries in place. 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            ALLERGIES:  is allergic to penicillins; cyclobenzaprine; hydrocodone; meperidine; amoxicillin-pot clavulanate; cefaclor; codeine; darvocet; demerol; erythromycin; flexeril; percocet; and sulfa  antibiotics.  Meds: Current Outpatient Prescriptions  Medication Sig Dispense Refill  . acetaminophen (TYLENOL) 325 MG tablet Take 650 mg by mouth every 6 (six) hours as needed. Reported on 03/21/2016    . cetirizine (ZYRTEC) 10 MG tablet Take 10 mg by mouth daily. Reported on 03/14/2016    . Docusate Sodium (COLACE PO) Take 1 tablet by mouth as needed.     . gabapentin (NEURONTIN) 600 MG tablet Take 1 tablet (600 mg total) by mouth 3 (three) times daily. (Patient taking differently: Take 600 mg by mouth at bedtime. ) 90 tablet 6  . lidocaine-prilocaine (EMLA) cream Apply 1 application topically as needed. 30 g 1  . Multiple Vitamins-Minerals (CENTRUM SILVER ADULT 50+ PO) Take by mouth. Reported on 03/14/2016    . ondansetron (ZOFRAN ODT) 4 MG disintegrating tablet Take 1 tablet (4 mg total) by mouth every 8 (eight) hours as needed for nausea or vomiting. 20 tablet 1  . senna (SENOKOT) 8.6 MG tablet Take 1 tablet by mouth as needed. Reported on 03/14/2016     No current facility-administered medications for this encounter.    Physical Findings:  height is 5\' 4"  (1.626 m) and weight is 295 lb 12.8 oz (134.174 kg). Her temperature is 98.3 F (36.8 C). Her blood pressure is 126/80 and her pulse is 101. Marland Kitchen     General: Alert and oriented, in no acute distress HEENT: Head is normocephalic. Extraocular movements are intact. Oropharynx is clear. Alopecia. Neck: Neck is supple, no palpable cervical or supraclavicular lymphadenopathy. Heart: Regular in rate and rhythm with no murmurs, rubs,  or gallops. Chest: Clear to auscultation bilaterally, with no rhonchi, wheezes, or rales. Abdomen: Soft, nontender, nondistended, with no rigidity or guarding. Extremities: No cyanosis or edema. Lymphatics: see Neck Exam Neurologic: No obvious focalities. Speech is fluent.  Psychiatric: Judgment and insight are intact. Affect is appropriate. Breast exam reveals well healed right lumpectomy scar. No  axillary/breast  masses bilaterally.  Lab Findings: Lab Results  Component Value Date   WBC 5.3 03/21/2016   HGB 11.3* 03/21/2016   HCT 34.8 03/21/2016   MCV 85.5 03/21/2016   PLT 252 03/21/2016       Radiographic Findings: No results found.  Impression/Plan: stage I right breast cancer  We discussed adjuvant radiotherapy today.  I recommend radiotherapy to the right breast in order to reduce risk of local recurrence by 2/3.  The risks, benefits and side effects of this treatment were discussed in detail.  She understands that radiotherapy is associated with skin irritation and fatigue in the acute setting. Late effects can include cosmetic changes and rare injury to internal organs.   She is enthusiastic about proceeding with treatment. A consent form has been signed and placed in her chart.  She will complete chemotherapy in early May. Simulation on May 22nd. Start radiation treatment on June 5th.   _____________________________________   Eppie Gibson, MD

## 2016-03-28 ENCOUNTER — Other Ambulatory Visit (HOSPITAL_BASED_OUTPATIENT_CLINIC_OR_DEPARTMENT_OTHER): Payer: BLUE CROSS/BLUE SHIELD

## 2016-03-28 ENCOUNTER — Ambulatory Visit: Payer: BLUE CROSS/BLUE SHIELD

## 2016-03-28 ENCOUNTER — Encounter: Payer: Self-pay | Admitting: Nurse Practitioner

## 2016-03-28 ENCOUNTER — Encounter: Payer: Self-pay | Admitting: *Deleted

## 2016-03-28 ENCOUNTER — Ambulatory Visit (HOSPITAL_BASED_OUTPATIENT_CLINIC_OR_DEPARTMENT_OTHER): Payer: BLUE CROSS/BLUE SHIELD | Admitting: Nurse Practitioner

## 2016-03-28 ENCOUNTER — Ambulatory Visit (HOSPITAL_BASED_OUTPATIENT_CLINIC_OR_DEPARTMENT_OTHER): Payer: BLUE CROSS/BLUE SHIELD

## 2016-03-28 ENCOUNTER — Telehealth: Payer: Self-pay | Admitting: Nurse Practitioner

## 2016-03-28 VITALS — BP 134/69 | HR 109 | Temp 98.2°F | Resp 18 | Ht 64.0 in | Wt 297.3 lb

## 2016-03-28 DIAGNOSIS — Z5112 Encounter for antineoplastic immunotherapy: Secondary | ICD-10-CM

## 2016-03-28 DIAGNOSIS — C50411 Malignant neoplasm of upper-outer quadrant of right female breast: Secondary | ICD-10-CM

## 2016-03-28 DIAGNOSIS — Z170 Estrogen receptor positive status [ER+]: Secondary | ICD-10-CM

## 2016-03-28 DIAGNOSIS — Z95828 Presence of other vascular implants and grafts: Secondary | ICD-10-CM | POA: Insufficient documentation

## 2016-03-28 DIAGNOSIS — G620 Drug-induced polyneuropathy: Secondary | ICD-10-CM

## 2016-03-28 DIAGNOSIS — R5383 Other fatigue: Secondary | ICD-10-CM | POA: Diagnosis not present

## 2016-03-28 DIAGNOSIS — Z5111 Encounter for antineoplastic chemotherapy: Secondary | ICD-10-CM | POA: Diagnosis not present

## 2016-03-28 DIAGNOSIS — T451X5A Adverse effect of antineoplastic and immunosuppressive drugs, initial encounter: Secondary | ICD-10-CM

## 2016-03-28 LAB — COMPREHENSIVE METABOLIC PANEL
ALT: 12 U/L (ref 0–55)
AST: 19 U/L (ref 5–34)
Albumin: 3 g/dL — ABNORMAL LOW (ref 3.5–5.0)
Alkaline Phosphatase: 85 U/L (ref 40–150)
Anion Gap: 6 mEq/L (ref 3–11)
BILIRUBIN TOTAL: 0.4 mg/dL (ref 0.20–1.20)
BUN: 9.5 mg/dL (ref 7.0–26.0)
CO2: 27 meq/L (ref 22–29)
CREATININE: 0.7 mg/dL (ref 0.6–1.1)
Calcium: 8.6 mg/dL (ref 8.4–10.4)
Chloride: 109 mEq/L (ref 98–109)
EGFR: >90 mL/min/{1.73_m2} (ref >90)
GLUCOSE: 118 mg/dL (ref 70–140)
Potassium: 3.6 mEq/L (ref 3.5–5.1)
SODIUM: 141 meq/L (ref 136–145)
TOTAL PROTEIN: 6.4 g/dL (ref 6.4–8.3)

## 2016-03-28 LAB — CBC WITH DIFFERENTIAL/PLATELET
BASO%: 1.4 % (ref 0.0–2.0)
Basophils Absolute: 0.1 10*3/uL (ref 0.0–0.1)
EOS%: 2 % (ref 0.0–7.0)
Eosinophils Absolute: 0.1 10*3/uL (ref 0.0–0.5)
HCT: 35.4 % (ref 34.8–46.6)
HEMOGLOBIN: 11.6 g/dL (ref 11.6–15.9)
LYMPH%: 28.3 % (ref 14.0–49.7)
MCH: 27.7 pg (ref 25.1–34.0)
MCHC: 32.8 g/dL (ref 31.5–36.0)
MCV: 84.3 fL (ref 79.5–101.0)
MONO#: 0.3 10*3/uL (ref 0.1–0.9)
MONO%: 5.6 % (ref 0.0–14.0)
NEUT%: 62.7 % (ref 38.4–76.8)
NEUTROS ABS: 3.5 10*3/uL (ref 1.5–6.5)
Platelets: 259 10*3/uL (ref 145–400)
RBC: 4.19 10*6/uL (ref 3.70–5.45)
RDW: 14.9 % — AB (ref 11.2–14.5)
WBC: 5.6 10*3/uL (ref 3.9–10.3)
lymph#: 1.6 10*3/uL (ref 0.9–3.3)

## 2016-03-28 MED ORDER — DIPHENHYDRAMINE HCL 25 MG PO CAPS
ORAL_CAPSULE | ORAL | Status: AC
  Filled 2016-03-28: qty 2

## 2016-03-28 MED ORDER — SODIUM CHLORIDE 0.9 % IV SOLN
Freq: Once | INTRAVENOUS | Status: AC
  Administered 2016-03-28: 14:00:00 via INTRAVENOUS
  Filled 2016-03-28: qty 4

## 2016-03-28 MED ORDER — HEPARIN SOD (PORK) LOCK FLUSH 100 UNIT/ML IV SOLN
500.0000 [IU] | Freq: Once | INTRAVENOUS | Status: AC | PRN
  Administered 2016-03-28: 500 [IU]
  Filled 2016-03-28: qty 5

## 2016-03-28 MED ORDER — PACLITAXEL PROTEIN-BOUND CHEMO INJECTION 100 MG
100.0000 mg/m2 | Freq: Once | INTRAVENOUS | Status: AC
  Administered 2016-03-28: 225 mg via INTRAVENOUS
  Filled 2016-03-28: qty 45

## 2016-03-28 MED ORDER — SODIUM CHLORIDE 0.9 % IJ SOLN
10.0000 mL | INTRAMUSCULAR | Status: DC | PRN
  Administered 2016-03-28: 10 mL via INTRAVENOUS
  Filled 2016-03-28: qty 10

## 2016-03-28 MED ORDER — DIPHENHYDRAMINE HCL 25 MG PO CAPS
50.0000 mg | ORAL_CAPSULE | Freq: Once | ORAL | Status: AC
  Administered 2016-03-28: 50 mg via ORAL

## 2016-03-28 MED ORDER — ACETAMINOPHEN 325 MG PO TABS
650.0000 mg | ORAL_TABLET | Freq: Once | ORAL | Status: AC
  Administered 2016-03-28: 650 mg via ORAL

## 2016-03-28 MED ORDER — SODIUM CHLORIDE 0.9 % IV SOLN
Freq: Once | INTRAVENOUS | Status: AC
  Administered 2016-03-28: 14:00:00 via INTRAVENOUS

## 2016-03-28 MED ORDER — SODIUM CHLORIDE 0.9% FLUSH
10.0000 mL | INTRAVENOUS | Status: DC | PRN
  Administered 2016-03-28: 10 mL
  Filled 2016-03-28: qty 10

## 2016-03-28 MED ORDER — TRASTUZUMAB CHEMO INJECTION 440 MG
6.0000 mg/kg | Freq: Once | INTRAVENOUS | Status: AC
  Administered 2016-03-28: 756 mg via INTRAVENOUS
  Filled 2016-03-28: qty 36

## 2016-03-28 MED ORDER — ACETAMINOPHEN 325 MG PO TABS
ORAL_TABLET | ORAL | Status: AC
  Filled 2016-03-28: qty 2

## 2016-03-28 NOTE — Patient Instructions (Signed)

## 2016-03-28 NOTE — Patient Instructions (Signed)
[facility] Discharge Instructions for Patients Receiving Chemotherapy  Today you received the following chemotherapy agents:  [place]ceptin, Abraxane  To help prevent nausea and vomiting after your treatment, we encourage you to take your nausea medication as prescribed.   If you develop nausea and vomiting that is not controlled by your nausea medication, call the clinic.   BELOW ARE SYMPTOMS THAT SHOULD BE REPORTED IMMEDIATELY:  *FEVER GREATER THAN 100.5 F  *CHILLS WITH OR WITHOUT FEVER  NAUSEA AND VOMITING THAT IS NOT CONTROLLED WITH YOUR NAUSEA MEDICATION  *UNUSUAL SHORTNESS OF BREATH  *UNUSUAL BRUISING OR BLEEDING  TENDERNESS IN MOUTH AND THROAT WITH OR WITHOUT PRESENCE OF ULCERS  *URINARY PROBLEMS  *BOWEL PROBLEMS  UNUSUAL RASH Items with * indicate a potential emergency and should be followed up as soon as possible.  Feel free to call the clinic you have any questions or concerns. The clinic phone number is [phone].  Please show the [facility] at check-in to the Emergency Department and triage nurse.

## 2016-03-28 NOTE — Telephone Encounter (Signed)
appt made and avs will print in treatment room °

## 2016-03-28 NOTE — Progress Notes (Signed)
Vanessa [name]  [phone] [phone]     ID: Vanessa [name] DOB: 02/14/1961  MR#: [id number]  RDE#:[id number]  Patient Care Team: Vanessa Nip, MD as PCP - General (Family Medicine) Vanessa Luna, MD as Consulting Physician (General Surgery) Vanessa Cruel, MD as Consulting Physician (Oncology) Vanessa Pacas, MD as Consulting Physician (Neurology) Vanessa [name], DPM as Consulting Physician (Podiatry) Vanessa Salina, MD as Consulting Physician (Obstetrics and Gynecology) Vanessa Gibson, MD as Attending Physician (Radiation Oncology) Vanessa Dresser, MD as Consulting Physician (Cardiology) PCP: Vanessa Nip, MD OTHER MD:  CHIEF COMPLAINT: Triple positive breast cancer  CURRENT TREATMENT:  Abraxane, trastuzumab  BREAST CANCER HISTORY: From the original intake note:  Vanessa [name] had screening mammography at Dr. cousins office on 11/06/2015 suggesting a change in the right breast. She was scheduled for right diagnostic mammography and tomosynthesis with ultrasonography at the [facility] 11/13/2015. The breast density was category B. In the upper outer quadrant of the right breast there was a small irregular mass with suspicious internal calcifications. By exam this was small, mobile, firm, and located at the 10:00 position 7 cm from the nipple. Ultrasound of the right breast confirmed an irregularly marginated hypoechoic mass measuring 1.1 cm. The right axilla was sonographically benign.  Biopsy of the right breast mass in question 11/15/2015 showed ([id number]) an invasive ductal carcinoma, grade 2, estrogen receptor 100% positive, progesterone receptor 2% positive, both with strong staining intensity, with an MIB-1 of 70%, and HER-2 amplification, the signals ratio being 4.92 and the number per cell 17.45.  The patient's subsequent history is as detailed below.  INTERVAL HISTORY: Vanessa [name] returns today for follow-up of her  HER-2 positive  breast cancer, accompanied by a friend. Today is day 1, cycle 10 of 12 planned weekly doses of abraxane. Trastuzumab is given every 3 weeks as well, next due today.  REVIEW OF SYSTEMS: Vanessa [name] main complaint today is fatigue. She is easily winded with mild physical activity. She continues to work full time, without taking any days off beyond treatment day. She denies fevers, chills, nausea, vomiting, or changes in bowel or bladder habits. She has had intermittent numbness and tingling to her hands, but none today. It does not impair her fine motor skills such as writing, wearing jewelery, or buttoning her shirts. A detailed review of systems is otherwise stable.  PAST MEDICAL HISTORY: Past Medical History  Diagnosis Date  . Allergy   . Arthritis   . Anemia   . Trigeminal neuralgia 05/2014  . Family history of breast cancer   . Family history of colon cancer   . Family history of thyroid cancer   . Hypertension     no meds now  . Anxiety   . PONV (postoperative nausea and vomiting)     PAST SURGICAL HISTORY: Past Surgical History  Procedure Laterality Date  . Breast surgery      biopsy  . Abdominal hysterectomy    . Meniscus repair      L knee  . Eye surgery    . Breast lumpectomy with radioactive seed and sentinel lymph node biopsy Right 12/29/2015    Procedure: RIGHT BREAST  RADIOACTIVE SEED LOCALIZATION LUMPECTOMY AND RIGHT SENTINEL LYMPH NODE MAPPING;  Surgeon: Vanessa Luna, MD;  Location: [facility];  Service: General;  Laterality: Right;  . Portacath placement N/A 12/29/2015    Procedure: INSERTION PORT-A-CATH;  Surgeon: Vanessa Luna, MD;  Location: [facility];  Service: General;  Laterality: N/A;    FAMILY HISTORY Family History  Problem Relation Age of Onset  . Diabetes Mother   . Hypertension Mother   . Asthma Daughter   . Diabetes Sister   . Hypertension Sister   . Colon cancer Sister 54  . Thyroid cancer Sister 93  . Lung cancer  Father   . Breast cancer Cousin     maternal first cousin  . Breast cancer Cousin 58    maternal first cousin - inflammatory   the patient's father died from lung cancer at the age of 76, in the setting of tobacco abuse. The patient's mother is living, at age 69. The patient had 2 brothers, 3 sisters. One sister was diagnosed with colon cancer in her 52s and later thyroid cancer. She died from metastatic disease. One brother died with pulmonary problems. On the maternal side a cousin was diagnosed with inflammatory breast cancer at the age of 97. A second cousin on the maternal side was diagnosed with breast cancer in her early 28s. There is no history of ovarian cancer in the family.  GYNECOLOGIC HISTORY:  No LMP recorded. Patient is postmenopausal. Menarche age 6, first live birth age 85, the patient is Vanessa [name] P2. She underwent simple hysterectomy with bilateral salpingectomy 06/28/2010, with benign pathology([id number]). She still has her ovaries in place. 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 She describes herself is single. At home she lives with her daughter Vanessa [name], who teaches theater at [employer] and is currently studying towards a Masters in counseling; and her son Vanessa [name], who is a professional basketball player in the international circuit (currently with the [employer]). The patient has no grandchildren. She attends a Tour manager    ADVANCED DIRECTIVES: Not in place. At the initial clinic visit 03/28/2016 the patient was given the appropriate forms to complete and notarize at her discretion.  HEALTH MAINTENANCE: Social History  Substance Use Topics  . Smoking status: Never Smoker   . Smokeless tobacco: Never Used  . Alcohol Use: Yes     Comment: social     Colonoscopy: 2016/[facility]  PAP:  Bone density:  Lipid  panel:  Allergies  Allergen Reactions  . Penicillins Anaphylaxis  . Cyclobenzaprine Swelling  . Hydrocodone Itching  . Meperidine Swelling  . Amoxicillin-Pot Clavulanate Rash  . Cefaclor Rash  . Codeine Rash  . Darvocet [Propoxyphene N-Acetaminophen] Rash  . Demerol Rash  . Erythromycin Rash  . Flexeril [Cyclobenzaprine Hcl] Rash  . Percocet [Oxycodone-Acetaminophen] Itching and Rash    Red streaks come up on skin  . Sulfa Antibiotics Rash    Current Outpatient Prescriptions  Medication Sig Dispense Refill  . acetaminophen (TYLENOL) 325 MG tablet Take 650 mg by mouth every 6 (six) hours as needed. Reported on 03/21/2016    . cetirizine (ZYRTEC) 10 MG tablet Take 10 mg by mouth daily. Reported on 03/14/2016    . Docusate Sodium (COLACE PO) Take 1 tablet by mouth as needed.     . gabapentin (NEURONTIN) 600 MG tablet Take 1 tablet (600 mg total) by mouth 3 (three) times daily. (Patient taking differently: Take 600 mg by mouth at bedtime. ) 90 tablet 6  . lidocaine-prilocaine (EMLA) cream Apply 1 application topically as needed. 30 g 1  . Multiple Vitamins-Minerals (CENTRUM SILVER ADULT 50+ PO) Take by mouth. Reported on 03/14/2016    . ondansetron [facility]  ODT) 4 MG disintegrating tablet Take 1 tablet (4 mg total) by mouth every 8 (eight) hours as needed for nausea or vomiting. 20 tablet 1  . senna (SENOKOT) 8.6 MG tablet Take 1 tablet by mouth as needed. Reported on 03/14/2016     No current facility-administered medications for this visit.    OBJECTIVE: Middle-aged African-American woman  Filed Vitals:   03/28/16 1256  BP: 134/69  Pulse: 109  Temp: 98.2 F (36.8 C)  Resp: 18     Body mass index is 51.01 kg/(m^2).    ECOG FS:1 - Symptomatic but completely ambulatory  Skin: warm, dry  HEENT: sclerae anicteric, conjunctivae pink, oropharynx clear. No thrush or mucositis.  Lymph Nodes: No cervical or supraclavicular lymphadenopathy  Lungs: clear to auscultation bilaterally, no  rales, wheezes, or rhonci  Heart: regular rate and rhythm  Abdomen: round, soft, non tender, positive bowel sounds  Musculoskeletal: No focal spinal tenderness, no peripheral edema  Neuro: non focal, well oriented, positive affect  Breasts: deferred  LAB RESULTS:  CMP     Component Value Date/Time   NA 141 03/28/2016 1152   NA 141 12/20/2015 1120   NA 143 04/25/2015 1148   K 3.6 03/28/2016 1152   K 3.3* 12/20/2015 1120   CL 106 12/20/2015 1120   CO2 27 03/28/2016 1152   CO2 28 12/20/2015 1120   GLUCOSE 118 03/28/2016 1152   GLUCOSE 100* 12/20/2015 1120   GLUCOSE 89 04/25/2015 1148   BUN 9.5 03/28/2016 1152   BUN 12 12/20/2015 1120   BUN 13 04/25/2015 1148   CREATININE 0.7 03/28/2016 1152   CREATININE 0.73 12/20/2015 1120   CREATININE 0.72 05/25/2014 1112   CALCIUM 8.6 03/28/2016 1152   CALCIUM 9.1 12/20/2015 1120   PROT 6.4 03/28/2016 1152   PROT 7.4 12/20/2015 1120   ALBUMIN 3.0* 03/28/2016 1152   ALBUMIN 3.4* 12/20/2015 1120   AST 19 03/28/2016 1152   AST 19 12/20/2015 1120   ALT 12 03/28/2016 1152   ALT 14 12/20/2015 1120   ALKPHOS 85 03/28/2016 1152   ALKPHOS 105 12/20/2015 1120   BILITOT 0.40 03/28/2016 1152   BILITOT 0.3 12/20/2015 1120   GFRNONAA >60 12/20/2015 1120   GFRAA >60 12/20/2015 1120    INo results found for: SPEP, UPEP  Lab Results  Component Value Date   WBC 5.6 03/28/2016   NEUTROABS 3.5 03/28/2016   HGB 11.6 03/28/2016   HCT 35.4 03/28/2016   MCV 84.3 03/28/2016   PLT 259 03/28/2016      Chemistry      Component Value Date/Time   NA 141 03/28/2016 1152   NA 141 12/20/2015 1120   NA 143 04/25/2015 1148   K 3.6 03/28/2016 1152   K 3.3* 12/20/2015 1120   CL 106 12/20/2015 1120   CO2 27 03/28/2016 1152   CO2 28 12/20/2015 1120   BUN 9.5 03/28/2016 1152   BUN 12 12/20/2015 1120   BUN 13 04/25/2015 1148   CREATININE 0.7 03/28/2016 1152   CREATININE 0.73 12/20/2015 1120   CREATININE 0.72 05/25/2014 1112      Component Value  Date/Time   CALCIUM 8.6 03/28/2016 1152   CALCIUM 9.1 12/20/2015 1120   ALKPHOS 85 03/28/2016 1152   ALKPHOS 105 12/20/2015 1120   AST 19 03/28/2016 1152   AST 19 12/20/2015 1120   ALT 12 03/28/2016 1152   ALT 14 12/20/2015 1120   BILITOT 0.40 03/28/2016 1152   BILITOT 0.3 12/20/2015 1120  No results found for: LABCA2  No components found for: [id number]  No results for input(s): INR in the last 168 hours.  Urinalysis No results found for: COLORURINE, APPEARANCEUR, LABSPEC, PHURINE, GLUCOSEU, HGBUR, BILIRUBINUR, KETONESUR, PROTEINUR, UROBILINOGEN, NITRITE, LEUKOCYTESUR  STUDIES: No results found.  ASSESSMENT: 55 y.o. [place] woman status post right breast upper outer quadrant biopsy 11/15/2015 for a clinical T1c N0, stage IA invasive ductal carcinoma, grade 2, estrogen and progesterone receptor positive, HER-2 amplified, with an MIB-1 of 70%  (1) Genetics testing 12/22/2015 through the Hereditary Gene Panel offered by [facility] found no deleterious mutations in  APC, ATM, AXIN2, BARD1, BMPR1A, BRCA1, BRCA2, BRIP1, CDH1, CDKN2A, CHEK2, DICER1, EPCAM, GREM1, KIT, MEN1, MLH1, MSH2, MSH6, MUTYH, NBN, NF1, PALB2, PDGFRA, PMS2, POLD1, POLE, PTEN, RAD50, RAD51C, RAD51D, SDHA, SDHB, SDHC, SDHD, SMAD4, SMARCA4. STK11, TP53, TSC1, TSC2, and VHL.  (2) right lumpectomy with sentinel lymph node sampling 12/29/2015 showed a pT1c pN0, stage IA invasive ductal carcinoma, grade 3, with negative though close margins (64m to DCIS)  (3) adjuvant chemotherapy with Abraxane weekly 12 with trastuzumab every 3 weeks to start 01/18/2016   (4) trastuzumab to be continued to total one year  (a) echo 12/18/2015 shows an EF of 65-70%  (5) adjuvant radiation to follow chemotherapy  (6) anti-estrogens to follow radiation  PLAN: KDayamifatigued today. The labs were reviewed in detail and are entirely stable. She is not even anemic this week. She will proceed with cycle 10 of abraxane along with her  next dose of trastuzumab as planned. We will continue to monitor her neuropathy symptoms, which have not been lasting so far.  [place] return in 1 week for cycle 11 of abraxane. She understands and agrees with this plan. She knows the goal of treatment in her case is cure. She has been encouraged to call with any issues that might arise before her next visit here.   HLaurie Panda NP   03/28/2016 1:35 PM

## 2016-03-29 ENCOUNTER — Ambulatory Visit: Payer: BLUE CROSS/BLUE SHIELD | Admitting: Radiation Oncology

## 2016-04-02 ENCOUNTER — Encounter: Payer: Self-pay | Admitting: *Deleted

## 2016-04-04 ENCOUNTER — Ambulatory Visit (HOSPITAL_BASED_OUTPATIENT_CLINIC_OR_DEPARTMENT_OTHER): Payer: BLUE CROSS/BLUE SHIELD

## 2016-04-04 ENCOUNTER — Other Ambulatory Visit (HOSPITAL_BASED_OUTPATIENT_CLINIC_OR_DEPARTMENT_OTHER): Payer: BLUE CROSS/BLUE SHIELD

## 2016-04-04 ENCOUNTER — Ambulatory Visit: Payer: BLUE CROSS/BLUE SHIELD

## 2016-04-04 ENCOUNTER — Ambulatory Visit: Payer: Self-pay | Admitting: Nurse Practitioner

## 2016-04-04 VITALS — BP 125/71 | HR 106 | Temp 98.2°F | Resp 18

## 2016-04-04 DIAGNOSIS — Z95828 Presence of other vascular implants and grafts: Secondary | ICD-10-CM

## 2016-04-04 DIAGNOSIS — Z5111 Encounter for antineoplastic chemotherapy: Secondary | ICD-10-CM | POA: Diagnosis not present

## 2016-04-04 DIAGNOSIS — C50411 Malignant neoplasm of upper-outer quadrant of right female breast: Secondary | ICD-10-CM

## 2016-04-04 LAB — COMPREHENSIVE METABOLIC PANEL
ALBUMIN: 2.9 g/dL — AB (ref 3.5–5.0)
ALK PHOS: 80 U/L (ref 40–150)
ALT: 13 U/L (ref 0–55)
ANION GAP: 5 meq/L (ref 3–11)
AST: 16 U/L (ref 5–34)
BUN: 12.4 mg/dL (ref 7.0–26.0)
CO2: 27 mEq/L (ref 22–29)
Calcium: 8.6 mg/dL (ref 8.4–10.4)
Chloride: 108 mEq/L (ref 98–109)
Creatinine: 0.7 mg/dL (ref 0.6–1.1)
Glucose: 116 mg/dl (ref 70–140)
POTASSIUM: 3.2 meq/L — AB (ref 3.5–5.1)
Sodium: 141 mEq/L (ref 136–145)
TOTAL PROTEIN: 6 g/dL — AB (ref 6.4–8.3)

## 2016-04-04 LAB — CBC WITH DIFFERENTIAL/PLATELET
BASO%: 0.8 % (ref 0.0–2.0)
BASOS ABS: 0 10*3/uL (ref 0.0–0.1)
EOS ABS: 0.1 10*3/uL (ref 0.0–0.5)
EOS%: 2 % (ref 0.0–7.0)
HCT: 34.1 % — ABNORMAL LOW (ref 34.8–46.6)
HGB: 11 g/dL — ABNORMAL LOW (ref 11.6–15.9)
LYMPH%: 36 % (ref 14.0–49.7)
MCH: 27.5 pg (ref 25.1–34.0)
MCHC: 32.3 g/dL (ref 31.5–36.0)
MCV: 85.3 fL (ref 79.5–101.0)
MONO#: 0.3 10*3/uL (ref 0.1–0.9)
MONO%: 6.3 % (ref 0.0–14.0)
NEUT%: 54.9 % (ref 38.4–76.8)
NEUTROS ABS: 2.8 10*3/uL (ref 1.5–6.5)
PLATELETS: 256 10*3/uL (ref 145–400)
RBC: 4 10*6/uL (ref 3.70–5.45)
RDW: 15.7 % — ABNORMAL HIGH (ref 11.2–14.5)
WBC: 5.1 10*3/uL (ref 3.9–10.3)
lymph#: 1.8 10*3/uL (ref 0.9–3.3)

## 2016-04-04 MED ORDER — DEXAMETHASONE SODIUM PHOSPHATE 100 MG/10ML IJ SOLN
Freq: Once | INTRAMUSCULAR | Status: AC
  Administered 2016-04-04: 15:00:00 via INTRAVENOUS
  Filled 2016-04-04: qty 4

## 2016-04-04 MED ORDER — SODIUM CHLORIDE 0.9 % IV SOLN
Freq: Once | INTRAVENOUS | Status: AC
  Administered 2016-04-04: 15:00:00 via INTRAVENOUS

## 2016-04-04 MED ORDER — SODIUM CHLORIDE 0.9 % IJ SOLN
10.0000 mL | INTRAMUSCULAR | Status: DC | PRN
  Administered 2016-04-04: 10 mL via INTRAVENOUS
  Filled 2016-04-04: qty 10

## 2016-04-04 MED ORDER — PACLITAXEL PROTEIN-BOUND CHEMO INJECTION 100 MG
100.0000 mg/m2 | Freq: Once | INTRAVENOUS | Status: AC
  Administered 2016-04-04: 225 mg via INTRAVENOUS
  Filled 2016-04-04: qty 45

## 2016-04-04 MED ORDER — HEPARIN SOD (PORK) LOCK FLUSH 100 UNIT/ML IV SOLN
500.0000 [IU] | Freq: Once | INTRAVENOUS | Status: AC | PRN
  Administered 2016-04-04: 500 [IU]
  Filled 2016-04-04: qty 5

## 2016-04-04 MED ORDER — SODIUM CHLORIDE 0.9 % IJ SOLN
10.0000 mL | INTRAMUSCULAR | Status: DC | PRN
  Administered 2016-04-04: 10 mL
  Filled 2016-04-04: qty 10

## 2016-04-04 NOTE — Patient Instructions (Signed)
[facility] Discharge Instructions for Patients Receiving Chemotherapy  Today you received the following chemotherapy agents Abraxane To help prevent nausea and vomiting after your treatment, we encourage you to take your nausea medication as prescribed.   If you develop nausea and vomiting that is not controlled by your nausea medication, call the clinic.   BELOW ARE SYMPTOMS THAT SHOULD BE REPORTED IMMEDIATELY:  *FEVER GREATER THAN 100.5 F  *CHILLS WITH OR WITHOUT FEVER  NAUSEA AND VOMITING THAT IS NOT CONTROLLED WITH YOUR NAUSEA MEDICATION  *UNUSUAL SHORTNESS OF BREATH  *UNUSUAL BRUISING OR BLEEDING  TENDERNESS IN MOUTH AND THROAT WITH OR WITHOUT PRESENCE OF ULCERS  *URINARY PROBLEMS  *BOWEL PROBLEMS  UNUSUAL RASH Items with * indicate a potential emergency and should be followed up as soon as possible.  Feel free to call the clinic you have any questions or concerns. The clinic phone number is [phone].  Please show the CHEMO ALERT CARD at check-in to the Emergency Department and triage nurse.   

## 2016-04-04 NOTE — Patient Instructions (Signed)

## 2016-04-06 ENCOUNTER — Other Ambulatory Visit: Payer: Self-pay | Admitting: Neurology

## 2016-04-11 ENCOUNTER — Encounter: Payer: Self-pay | Admitting: Nurse Practitioner

## 2016-04-11 ENCOUNTER — Encounter: Payer: Self-pay | Admitting: *Deleted

## 2016-04-11 ENCOUNTER — Ambulatory Visit: Payer: BLUE CROSS/BLUE SHIELD

## 2016-04-11 ENCOUNTER — Ambulatory Visit (HOSPITAL_BASED_OUTPATIENT_CLINIC_OR_DEPARTMENT_OTHER): Payer: BLUE CROSS/BLUE SHIELD | Admitting: Nurse Practitioner

## 2016-04-11 ENCOUNTER — Other Ambulatory Visit (HOSPITAL_BASED_OUTPATIENT_CLINIC_OR_DEPARTMENT_OTHER): Payer: BLUE CROSS/BLUE SHIELD

## 2016-04-11 VITALS — BP 132/61 | HR 112 | Temp 98.0°F | Resp 18 | Ht 64.0 in | Wt 301.7 lb

## 2016-04-11 DIAGNOSIS — Z170 Estrogen receptor positive status [ER+]: Secondary | ICD-10-CM

## 2016-04-11 DIAGNOSIS — G620 Drug-induced polyneuropathy: Secondary | ICD-10-CM

## 2016-04-11 DIAGNOSIS — N951 Menopausal and female climacteric states: Secondary | ICD-10-CM

## 2016-04-11 DIAGNOSIS — C50411 Malignant neoplasm of upper-outer quadrant of right female breast: Secondary | ICD-10-CM

## 2016-04-11 DIAGNOSIS — Z95828 Presence of other vascular implants and grafts: Secondary | ICD-10-CM

## 2016-04-11 DIAGNOSIS — T451X5A Adverse effect of antineoplastic and immunosuppressive drugs, initial encounter: Secondary | ICD-10-CM

## 2016-04-11 LAB — CBC WITH DIFFERENTIAL/PLATELET
BASO%: 1.4 % (ref 0.0–2.0)
BASOS ABS: 0.1 10*3/uL (ref 0.0–0.1)
EOS ABS: 0.1 10*3/uL (ref 0.0–0.5)
EOS%: 1.9 % (ref 0.0–7.0)
HCT: 36.4 % (ref 34.8–46.6)
HEMOGLOBIN: 11.6 g/dL (ref 11.6–15.9)
LYMPH%: 31 % (ref 14.0–49.7)
MCH: 27.2 pg (ref 25.1–34.0)
MCHC: 31.9 g/dL (ref 31.5–36.0)
MCV: 85.5 fL (ref 79.5–101.0)
MONO#: 0.4 10*3/uL (ref 0.1–0.9)
MONO%: 7.3 % (ref 0.0–14.0)
NEUT#: 3.3 10*3/uL (ref 1.5–6.5)
NEUT%: 58.4 % (ref 38.4–76.8)
Platelets: 265 10*3/uL (ref 145–400)
RBC: 4.25 10*6/uL (ref 3.70–5.45)
RDW: 16.1 % — AB (ref 11.2–14.5)
WBC: 5.7 10*3/uL (ref 3.9–10.3)
lymph#: 1.8 10*3/uL (ref 0.9–3.3)

## 2016-04-11 LAB — COMPREHENSIVE METABOLIC PANEL
ALT: 13 U/L (ref 0–55)
AST: 18 U/L (ref 5–34)
Albumin: 3.1 g/dL — ABNORMAL LOW (ref 3.5–5.0)
Alkaline Phosphatase: 69 U/L (ref 40–150)
Anion Gap: 7 mEq/L (ref 3–11)
BUN: 10.8 mg/dL (ref 7.0–26.0)
CO2: 27 meq/L (ref 22–29)
Calcium: 8.7 mg/dL (ref 8.4–10.4)
Chloride: 109 mEq/L (ref 98–109)
Creatinine: 0.7 mg/dL (ref 0.6–1.1)
GLUCOSE: 113 mg/dL (ref 70–140)
POTASSIUM: 3.3 meq/L — AB (ref 3.5–5.1)
SODIUM: 143 meq/L (ref 136–145)
Total Bilirubin: 0.4 mg/dL (ref 0.20–1.20)
Total Protein: 6.1 g/dL — ABNORMAL LOW (ref 6.4–8.3)

## 2016-04-11 MED ORDER — SODIUM CHLORIDE 0.9 % IJ SOLN
10.0000 mL | INTRAMUSCULAR | Status: DC | PRN
  Administered 2016-04-11: 10 mL via INTRAVENOUS
  Filled 2016-04-11: qty 10

## 2016-04-11 NOTE — Progress Notes (Signed)
[place]  [phone] [phone]     ID: Vanessa [name] DOB: July 12, 1961  MR#: [id number]  YSA#:[id number]  Patient Care Team: Vanessa Nip, MD as PCP - General (Family Medicine) Vanessa Luna, MD as Consulting Physician (General Surgery) Vanessa Cruel, MD as Consulting Physician (Oncology) Vanessa Pacas, MD as Consulting Physician (Neurology) Vanessa [name], DPM as Consulting Physician (Podiatry) Vanessa Salina, MD as Consulting Physician (Obstetrics and Gynecology) Vanessa Gibson, MD as Attending Physician (Radiation Oncology) Vanessa Dresser, MD as Consulting Physician (Cardiology) PCP: Vanessa Nip, MD OTHER MD:  CHIEF COMPLAINT: Triple positive breast cancer  CURRENT TREATMENT:  Abraxane, trastuzumab  BREAST CANCER HISTORY: From the original intake note:  Vanessa [name] had screening mammography at Dr. cousins office on 11/06/2015 suggesting a change in the right breast. She was scheduled for right diagnostic mammography and tomosynthesis with ultrasonography at the [facility] 11/13/2015. The breast density was category B. In the upper outer quadrant of the right breast there was a small irregular mass with suspicious internal calcifications. By exam this was small, mobile, firm, and located at the 10:00 position 7 cm from the nipple. Ultrasound of the right breast confirmed an irregularly marginated hypoechoic mass measuring 1.1 cm. The right axilla was sonographically benign.  Biopsy of the right breast mass in question 11/15/2015 showed ([id number]) an invasive ductal carcinoma, grade 2, estrogen receptor 100% positive, progesterone receptor 2% positive, both with strong staining intensity, with an MIB-1 of 70%, and HER-2 amplification, the signals ratio being 4.92 and the number per cell 17.45.  The patient's subsequent history is as detailed below.  INTERVAL HISTORY: Vanessa [name] returns today for follow-up of her  HER-2 positive  breast cancer, accompanied by a friend. Today is day 1, cycle 12 of 12 planned weekly doses of abraxane. Trastuzumab is given every 3 weeks as well, next due on 5/11.  REVIEW OF SYSTEMS: Vanessa [name] is experience a progression in her neuropathy symptoms. She is now having trouble signing her name, typing, and gripping her steering wheel. These symptoms are also fairly constant. She is fatigued and easily winded with mild physical activity. She denies fevers, chills, nausea, vomiting, or changes in bowel or bladder habits. A detailed review of systems is otherwise stable.   PAST MEDICAL HISTORY: Past Medical History  Diagnosis Date  . Allergy   . Arthritis   . Anemia   . Trigeminal neuralgia 05/2014  . Family history of breast cancer   . Family history of colon cancer   . Family history of thyroid cancer   . Hypertension     no meds now  . Anxiety   . PONV (postoperative nausea and vomiting)     PAST SURGICAL HISTORY: Past Surgical History  Procedure Laterality Date  . Breast surgery      biopsy  . Abdominal hysterectomy    . Meniscus repair      L knee  . Eye surgery    . Breast lumpectomy with radioactive seed and sentinel lymph node biopsy Right 12/29/2015    Procedure: RIGHT BREAST  RADIOACTIVE SEED LOCALIZATION LUMPECTOMY AND RIGHT SENTINEL LYMPH NODE MAPPING;  Surgeon: Vanessa Luna, MD;  Location: [facility];  Service: General;  Laterality: Right;  . Portacath placement N/A 12/29/2015    Procedure: INSERTION PORT-A-CATH;  Surgeon: Vanessa Luna, MD;  Location: [facility];  Service: General;  Laterality: N/A;    FAMILY HISTORY Family History  Problem Relation Age of Onset  .  Diabetes Vanessa [name]   . Hypertension Vanessa [name]   . Asthma Vanessa [name]   . Diabetes Vanessa [name]   . Hypertension Vanessa [name]   . Colon cancer Vanessa [name] 87  . Thyroid cancer Vanessa [name] 61  . Lung cancer Vanessa [name]   . Breast cancer Vanessa [name]     maternal first Vanessa [name]  . Breast cancer Vanessa [name] 2     maternal first Vanessa [name] - inflammatory   the patient's Vanessa [name] died from lung cancer at the age of 7, in the setting of tobacco abuse. The patient's Vanessa [name] is living, at age 72. The patient had 2 brothers, 3 sisters. One Vanessa [name] was diagnosed with colon cancer in her 28s and later thyroid cancer. She died from metastatic disease. One brother died with pulmonary problems. On the maternal side a Vanessa [name] was diagnosed with inflammatory breast cancer at the age of 44. A second Vanessa [name] on the maternal side was diagnosed with breast cancer in her early 62s. There is no history of ovarian cancer in the family.  GYNECOLOGIC HISTORY:  No LMP recorded. Patient is postmenopausal. Menarche age 63, first live birth age 81, the patient is Vanessa [name] P2. She underwent simple hysterectomy with bilateral salpingectomy 06/28/2010, with benign pathology([id number]). She still has her ovaries in place. 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 She describes herself is single. At home she lives with her Vanessa [name] Vanessa [name], who teaches theater at [employer] middle school and is currently studying towards a Masters in counseling; and her son Vanessa [name], who is a professional basketball player in the international circuit (currently with the [employer]). The patient has no grandchildren. She attends a Tour manager    ADVANCED DIRECTIVES: Not in place. At the initial clinic visit 04/11/2016 the patient was given the appropriate forms to complete and notarize at her discretion.  HEALTH MAINTENANCE: Social History  Substance Use Topics  . Smoking status: Never Smoker   . Smokeless tobacco: Never Used  . Alcohol Use: Yes     Comment: social     Colonoscopy: 2016/[facility]  PAP:  Bone density:  Lipid panel:  Allergies  Allergen Reactions  . Penicillins Anaphylaxis  . Cyclobenzaprine Swelling  .  Hydrocodone Itching  . Meperidine Swelling  . Amoxicillin-Pot Clavulanate Rash  . Cefaclor Rash  . Codeine Rash  . Darvocet [Propoxyphene N-Acetaminophen] Rash  . Demerol Rash  . Erythromycin Rash  . Flexeril [Cyclobenzaprine Hcl] Rash  . Percocet [Oxycodone-Acetaminophen] Itching and Rash    Red streaks come up on skin  . Sulfa Antibiotics Rash    Current Outpatient Prescriptions  Medication Sig Dispense Refill  . acetaminophen (TYLENOL) 325 MG tablet Take 650 mg by mouth every 6 (six) hours as needed. Reported on 03/21/2016    . cetirizine (ZYRTEC) 10 MG tablet Take 10 mg by mouth daily. Reported on 03/14/2016    . Docusate Sodium (COLACE PO) Take 1 tablet by mouth as needed.     . gabapentin (NEURONTIN) 600 MG tablet TAKE 1 TABLET (600 MG TOTAL) BY MOUTH 3 (THREE) TIMES DAILY. 90 tablet 6  . lidocaine-prilocaine (EMLA) cream Apply 1 application topically as needed. 30 g 1  . Multiple Vitamins-Minerals (CENTRUM SILVER ADULT 50+ PO) Take by mouth. Reported on 03/14/2016    . ondansetron (ZOFRAN ODT) 4 MG disintegrating tablet Take 1 tablet (4 mg total) by mouth every 8 (eight) hours as needed for nausea or vomiting. 20 tablet 1  .  senna (SENOKOT) 8.6 MG tablet Take 1 tablet by mouth as needed. Reported on 03/14/2016     No current facility-administered medications for this visit.    OBJECTIVE: Middle-aged African-American woman  Filed Vitals:   04/11/16 1435  BP: 132/61  Pulse: 112  Temp: 98 F (36.7 C)  Resp: 18     Body mass index is 51.76 kg/(m^2).    ECOG FS:1 - Symptomatic but completely ambulatory  Sclerae unicteric, pupils round and equal Oropharynx clear and moist-- no thrush or other lesions No cervical or supraclavicular adenopathy Lungs no rales or rhonchi Heart regular rate and rhythm Abd soft, nontender, positive bowel sounds MSK no focal spinal tenderness, no upper extremity lymphedema Neuro: nonfocal, well oriented, appropriate affect Breasts: deferred  LAB  RESULTS:  CMP     Component Value Date/Time   NA 143 04/11/2016 1341   NA 141 12/20/2015 1120   NA 143 04/25/2015 1148   K 3.3* 04/11/2016 1341   K 3.3* 12/20/2015 1120   CL 106 12/20/2015 1120   CO2 27 04/11/2016 1341   CO2 28 12/20/2015 1120   GLUCOSE 113 04/11/2016 1341   GLUCOSE 100* 12/20/2015 1120   GLUCOSE 89 04/25/2015 1148   BUN 10.8 04/11/2016 1341   BUN 12 12/20/2015 1120   BUN 13 04/25/2015 1148   CREATININE 0.7 04/11/2016 1341   CREATININE 0.73 12/20/2015 1120   CREATININE 0.72 05/25/2014 1112   CALCIUM 8.7 04/11/2016 1341   CALCIUM 9.1 12/20/2015 1120   PROT 6.1* 04/11/2016 1341   PROT 7.4 12/20/2015 1120   ALBUMIN 3.1* 04/11/2016 1341   ALBUMIN 3.4* 12/20/2015 1120   AST 18 04/11/2016 1341   AST 19 12/20/2015 1120   ALT 13 04/11/2016 1341   ALT 14 12/20/2015 1120   ALKPHOS 69 04/11/2016 1341   ALKPHOS 105 12/20/2015 1120   BILITOT 0.40 04/11/2016 1341   BILITOT 0.3 12/20/2015 1120   GFRNONAA >60 12/20/2015 1120   GFRAA >60 12/20/2015 1120    INo results found for: SPEP, UPEP  Lab Results  Component Value Date   WBC 5.7 04/11/2016   NEUTROABS 3.3 04/11/2016   HGB 11.6 04/11/2016   HCT 36.4 04/11/2016   MCV 85.5 04/11/2016   PLT 265 04/11/2016      Chemistry      Component Value Date/Time   NA 143 04/11/2016 1341   NA 141 12/20/2015 1120   NA 143 04/25/2015 1148   K 3.3* 04/11/2016 1341   K 3.3* 12/20/2015 1120   CL 106 12/20/2015 1120   CO2 27 04/11/2016 1341   CO2 28 12/20/2015 1120   BUN 10.8 04/11/2016 1341   BUN 12 12/20/2015 1120   BUN 13 04/25/2015 1148   CREATININE 0.7 04/11/2016 1341   CREATININE 0.73 12/20/2015 1120   CREATININE 0.72 05/25/2014 1112      Component Value Date/Time   CALCIUM 8.7 04/11/2016 1341   CALCIUM 9.1 12/20/2015 1120   ALKPHOS 69 04/11/2016 1341   ALKPHOS 105 12/20/2015 1120   AST 18 04/11/2016 1341   AST 19 12/20/2015 1120   ALT 13 04/11/2016 1341   ALT 14 12/20/2015 1120   BILITOT 0.40  04/11/2016 1341   BILITOT 0.3 12/20/2015 1120      No results found for: LABCA2  No components found for: LABCA125  No results for input(s): INR in the last 168 hours.  Urinalysis No results found for: COLORURINE, APPEARANCEUR, LABSPEC, PHURINE, GLUCOSEU, HGBUR, BILIRUBINUR, KETONESUR, PROTEINUR, UROBILINOGEN, NITRITE, LEUKOCYTESUR  STUDIES:  No results found.  ASSESSMENT: 55 y.o. Vanessa [name] woman status post right breast upper outer quadrant biopsy 11/15/2015 for a clinical T1c N0, stage IA invasive ductal carcinoma, grade 2, estrogen and progesterone receptor positive, HER-2 amplified, with an MIB-1 of 70%  (1) Genetics testing 12/22/2015 through the Hereditary Gene Panel offered by [facility] found no deleterious mutations in  APC, ATM, AXIN2, BARD1, BMPR1A, BRCA1, BRCA2, BRIP1, CDH1, CDKN2A, CHEK2, DICER1, EPCAM, GREM1, KIT, MEN1, MLH1, MSH2, MSH6, MUTYH, NBN, NF1, PALB2, PDGFRA, PMS2, POLD1, POLE, PTEN, RAD50, RAD51C, RAD51D, SDHA, SDHB, SDHC, SDHD, SMAD4, SMARCA4. STK11, TP53, TSC1, TSC2, and VHL.  (2) right lumpectomy with sentinel lymph node sampling 12/29/2015 showed a pT1c pN0, stage IA invasive ductal carcinoma, grade 3, with negative though close margins (55m to DCIS)  (3) adjuvant chemotherapy with Abraxane weekly 12 with trastuzumab every 3 weeks to start 01/18/2016   (4) trastuzumab to be continued to total one year  (a) echo 12/18/2015 shows an EF of 65-70%  (5) adjuvant radiation to follow chemotherapy  (6) anti-estrogens to follow radiation  PLAN: KJamilleand I discussed her neuropathy symptoms at length. She understands that this could be a permanent condition if not managed delicately. Despite being due for her 12th and final cycle of abraxane today, we are electing to hold treatment for 1 week to see if her symptoms improve any. She understands that we will likely stop this regimen short at 11 cycles if her symptoms do not improve greatly. She will of course continue  trastuzumab every 3 weeks for 1 year and will have antiestrogen therapy beginning in the summer for at least 5 years, so she is not greatly affecting her risk of recurrence by missing one dose of abraxane.   She is already on gabapentin for hot flashes. We discussed how this medicine is also useful in reducing neuropathy symptoms.  KOnyawill return in 1 week for follow up, where she will receive at least her next dose of trastuzumab. She understands and agrees with this plan. She knows the goal of treatment in her case is cure. She has been encouraged to call with any issues that might arise before her next visit here.   HLaurie Panda NP   04/11/2016 3:15 PM

## 2016-04-11 NOTE — Patient Instructions (Signed)

## 2016-04-15 ENCOUNTER — Telehealth: Payer: Self-pay | Admitting: Oncology

## 2016-04-15 ENCOUNTER — Telehealth: Payer: Self-pay | Admitting: *Deleted

## 2016-04-15 ENCOUNTER — Other Ambulatory Visit: Payer: Self-pay | Admitting: *Deleted

## 2016-04-15 MED ORDER — GABAPENTIN 300 MG PO CAPS: 300.0000 mg | ORAL_CAPSULE | Freq: Every day | ORAL | Status: DC

## 2016-04-15 NOTE — Telephone Encounter (Signed)
s.w. pt and advised on 5.11 time change pt ok and aware

## 2016-04-15 NOTE — Telephone Encounter (Signed)
Pt called today concerning the constant numbness /tingling/ pain to fingers. Unable to rest at night. Pt currently takes 600 mg Gabapentin three times daily. Little relief. Pt will continue to takes 600 mg in the morning, at noon and bedtime she will increase to 900 mg per NP. Pt will also get some B-complex and start taking daily, starting today. Pt verbalized understanding. Pt will see Gentry Fitz on 04/18/16.

## 2016-04-18 ENCOUNTER — Other Ambulatory Visit: Payer: Self-pay

## 2016-04-18 ENCOUNTER — Ambulatory Visit (HOSPITAL_BASED_OUTPATIENT_CLINIC_OR_DEPARTMENT_OTHER): Payer: BLUE CROSS/BLUE SHIELD

## 2016-04-18 ENCOUNTER — Ambulatory Visit (HOSPITAL_BASED_OUTPATIENT_CLINIC_OR_DEPARTMENT_OTHER): Payer: BLUE CROSS/BLUE SHIELD | Admitting: Nurse Practitioner

## 2016-04-18 ENCOUNTER — Ambulatory Visit: Payer: BLUE CROSS/BLUE SHIELD

## 2016-04-18 ENCOUNTER — Encounter: Payer: Self-pay | Admitting: *Deleted

## 2016-04-18 ENCOUNTER — Other Ambulatory Visit: Payer: Self-pay | Admitting: Oncology

## 2016-04-18 ENCOUNTER — Ambulatory Visit: Payer: Self-pay

## 2016-04-18 ENCOUNTER — Encounter: Payer: Self-pay | Admitting: Nurse Practitioner

## 2016-04-18 VITALS — BP 147/76 | HR 105 | Temp 98.4°F | Resp 18 | Wt 305.0 lb

## 2016-04-18 DIAGNOSIS — C50411 Malignant neoplasm of upper-outer quadrant of right female breast: Secondary | ICD-10-CM

## 2016-04-18 DIAGNOSIS — Z95828 Presence of other vascular implants and grafts: Secondary | ICD-10-CM

## 2016-04-18 DIAGNOSIS — G620 Drug-induced polyneuropathy: Secondary | ICD-10-CM

## 2016-04-18 DIAGNOSIS — Z5112 Encounter for antineoplastic immunotherapy: Secondary | ICD-10-CM

## 2016-04-18 DIAGNOSIS — R600 Localized edema: Secondary | ICD-10-CM | POA: Diagnosis not present

## 2016-04-18 DIAGNOSIS — R5383 Other fatigue: Secondary | ICD-10-CM

## 2016-04-18 DIAGNOSIS — T451X5A Adverse effect of antineoplastic and immunosuppressive drugs, initial encounter: Secondary | ICD-10-CM

## 2016-04-18 DIAGNOSIS — Z170 Estrogen receptor positive status [ER+]: Secondary | ICD-10-CM | POA: Diagnosis not present

## 2016-04-18 LAB — CBC WITH DIFFERENTIAL/PLATELET
BASO%: 0.6 % (ref 0.0–2.0)
Basophils Absolute: 0 10*3/uL (ref 0.0–0.1)
EOS%: 2.3 % (ref 0.0–7.0)
Eosinophils Absolute: 0.2 10*3/uL (ref 0.0–0.5)
HEMATOCRIT: 33.9 % — AB (ref 34.8–46.6)
HEMOGLOBIN: 11 g/dL — AB (ref 11.6–15.9)
LYMPH#: 2.1 10*3/uL (ref 0.9–3.3)
LYMPH%: 32.1 % (ref 14.0–49.7)
MCH: 27.4 pg (ref 25.1–34.0)
MCHC: 32.4 g/dL (ref 31.5–36.0)
MCV: 84.5 fL (ref 79.5–101.0)
MONO#: 0.6 10*3/uL (ref 0.1–0.9)
MONO%: 9.1 % (ref 0.0–14.0)
NEUT%: 55.9 % (ref 38.4–76.8)
NEUTROS ABS: 3.6 10*3/uL (ref 1.5–6.5)
Platelets: 263 10*3/uL (ref 145–400)
RBC: 4.01 10*6/uL (ref 3.70–5.45)
RDW: 15.8 % — ABNORMAL HIGH (ref 11.2–14.5)
WBC: 6.4 10*3/uL (ref 3.9–10.3)
nRBC: 0 % (ref 0–0)

## 2016-04-18 LAB — COMPREHENSIVE METABOLIC PANEL
ALBUMIN: 3 g/dL — AB (ref 3.5–5.0)
ALK PHOS: 78 U/L (ref 40–150)
ALT: 12 U/L (ref 0–55)
AST: 16 U/L (ref 5–34)
Anion Gap: 6 mEq/L (ref 3–11)
BILIRUBIN TOTAL: 0.38 mg/dL (ref 0.20–1.20)
BUN: 11.9 mg/dL (ref 7.0–26.0)
CALCIUM: 8.8 mg/dL (ref 8.4–10.4)
CO2: 27 mEq/L (ref 22–29)
CREATININE: 0.7 mg/dL (ref 0.6–1.1)
Chloride: 109 mEq/L (ref 98–109)
EGFR: >90 mL/min/{1.73_m2} (ref >90)
Glucose: 104 mg/dl (ref 70–140)
Potassium: 3.5 mEq/L (ref 3.5–5.1)
Sodium: 142 mEq/L (ref 136–145)
TOTAL PROTEIN: 6.2 g/dL — AB (ref 6.4–8.3)

## 2016-04-18 MED ORDER — ACETAMINOPHEN 325 MG PO TABS
650.0000 mg | ORAL_TABLET | Freq: Once | ORAL | Status: AC
  Administered 2016-04-18: 650 mg via ORAL

## 2016-04-18 MED ORDER — DIPHENHYDRAMINE HCL 25 MG PO CAPS
ORAL_CAPSULE | ORAL | Status: AC
  Filled 2016-04-18: qty 1

## 2016-04-18 MED ORDER — DIPHENHYDRAMINE HCL 25 MG PO CAPS
50.0000 mg | ORAL_CAPSULE | Freq: Once | ORAL | Status: AC
  Administered 2016-04-18: 50 mg via ORAL

## 2016-04-18 MED ORDER — SODIUM CHLORIDE 0.9 % IV SOLN
6.0000 mg/kg | Freq: Once | INTRAVENOUS | Status: AC
  Administered 2016-04-18: 840 mg via INTRAVENOUS
  Filled 2016-04-18: qty 40

## 2016-04-18 MED ORDER — SODIUM CHLORIDE 0.9 % IV SOLN
Freq: Once | INTRAVENOUS | Status: AC
  Administered 2016-04-18: 15:00:00 via INTRAVENOUS

## 2016-04-18 MED ORDER — ACETAMINOPHEN 325 MG PO TABS
ORAL_TABLET | ORAL | Status: AC
  Filled 2016-04-18: qty 2

## 2016-04-18 MED ORDER — TRASTUZUMAB CHEMO INJECTION 440 MG: 6.0000 mg/kg | Freq: Once | INTRAVENOUS | Status: DC

## 2016-04-18 MED ORDER — SODIUM CHLORIDE 0.9% FLUSH
10.0000 mL | INTRAVENOUS | Status: DC | PRN
  Administered 2016-04-18: 10 mL
  Filled 2016-04-18: qty 10

## 2016-04-18 MED ORDER — HEPARIN SOD (PORK) LOCK FLUSH 100 UNIT/ML IV SOLN
500.0000 [IU] | Freq: Once | INTRAVENOUS | Status: AC | PRN
  Administered 2016-04-18: 500 [IU]
  Filled 2016-04-18: qty 5

## 2016-04-18 MED ORDER — SODIUM CHLORIDE 0.9 % IJ SOLN
10.0000 mL | INTRAMUSCULAR | Status: DC | PRN
  Administered 2016-04-18: 10 mL via INTRAVENOUS
  Filled 2016-04-18: qty 10

## 2016-04-18 NOTE — Patient Instructions (Signed)

## 2016-04-18 NOTE — Progress Notes (Signed)
Vanessa [name]  Telephone:(336) [phone] Fax:(336) [phone]     ID: DOLLY HARBACH DOB: 1961/05/13  MR#: [id number]  MHD#:[id number]  Patient Care Team: Aretta Nip, MD as PCP - General (Family Medicine) Erroll Luna, MD as Consulting Physician (General Surgery) Chauncey Cruel, MD as Consulting Physician (Oncology) Marcial Pacas, MD as Consulting Physician (Neurology) Gean Birchwood, DPM as Consulting Physician (Podiatry) Servando Salina, MD as Consulting Physician (Obstetrics and Gynecology) Eppie Gibson, MD as Attending Physician (Radiation Oncology) Larey Dresser, MD as Consulting Physician (Cardiology) PCP: Aretta Nip, MD OTHER MD:  CHIEF COMPLAINT: Triple positive breast cancer  CURRENT TREATMENT:  Abraxane, trastuzumab  BREAST CANCER HISTORY: From the original intake note:  Vanessa [name] had screening mammography at Dr. cousins office on 11/06/2015 suggesting a change in the right breast. She was scheduled for right diagnostic mammography and tomosynthesis with ultrasonography at the [facility] 11/13/2015. The breast density was category B. In the upper outer quadrant of the right breast there was a small irregular mass with suspicious internal calcifications. By exam this was small, mobile, firm, and located at the 10:00 position 7 cm from the nipple. Ultrasound of the right breast confirmed an irregularly marginated hypoechoic mass measuring 1.1 cm. The right axilla was sonographically benign.  Biopsy of the right breast mass in question 11/15/2015 showed ([id number]) an invasive ductal carcinoma, grade 2, estrogen receptor 100% positive, progesterone receptor 2% positive, both with strong staining intensity, with an MIB-1 of 70%, and HER-2 amplification, the signals ratio being 4.92 and the number per cell 17.45.  The patient's subsequent history is as detailed below.  INTERVAL HISTORY: Vanessa [name] returns today for follow-up of her HER-2 positive  breast cancer, accompanied by a friend. Today is day 1, cycle 12 of 12 planned weekly doses of abraxane. Last week this dose was held because of progressive neuropathy symptoms. Her gabapentin was increased to '600mg'$  BID and '900mg'$  QHS, which has decreased the pain tremendously and allowed her to sleep through the night. She is able to type again and perform other fine motor skill based tasks. She is also due for her next dose of trastuzumab, which she tolerates well with no issues.   REVIEW OF SYSTEMS: Vanessa [name] denies fevers or chills. Her nausea is well controlled. She is moving her bowels well. Her appetite is good. She continues to gain weight, up 4lbs since last week. She complains of bilateral lower extremity edema in the past few days. She is fatigued. She is short of breath with exertion, but denies chest pain, cough, or palpitations. A detailed review of systems is otherwise stable.  PAST MEDICAL HISTORY: Past Medical History  Diagnosis Date  . Allergy   . Arthritis   . Anemia   . Trigeminal neuralgia 05/2014  . Family history of breast cancer   . Family history of colon cancer   . Family history of thyroid cancer   . Hypertension     no meds now  . Anxiety   . PONV (postoperative nausea and vomiting)     PAST SURGICAL HISTORY: Past Surgical History  Procedure Laterality Date  . Breast surgery      biopsy  . Abdominal hysterectomy    . Meniscus repair      L knee  . Eye surgery    . Breast lumpectomy with radioactive seed and sentinel lymph node biopsy Right 12/29/2015    Procedure: RIGHT BREAST  RADIOACTIVE SEED LOCALIZATION LUMPECTOMY AND RIGHT SENTINEL LYMPH NODE MAPPING;  Surgeon: Erroll Luna, MD;  Location: [facility];  Service: General;  Laterality: Right;  . Portacath placement N/A 12/29/2015    Procedure: INSERTION PORT-A-CATH;  Surgeon: Erroll Luna, MD;  Location: [facility];  Service: General;  Laterality: N/A;    FAMILY  HISTORY Family History  Problem Relation Age of Onset  . Diabetes Mother   . Hypertension Mother   . Asthma Daughter   . Diabetes Sister   . Hypertension Sister   . Colon cancer Sister 73  . Thyroid cancer Sister 42  . Lung cancer Father   . Breast cancer Cousin     maternal first cousin  . Breast cancer Cousin 18    maternal first cousin - inflammatory   the patient's father died from lung cancer at the age of 9, in the setting of tobacco abuse. The patient's mother is living, at age 29. The patient had 2 brothers, 3 sisters. One sister was diagnosed with colon cancer in her 31s and later thyroid cancer. She died from metastatic disease. One brother died with pulmonary problems. On the maternal side a cousin was diagnosed with inflammatory breast cancer at the age of 22. A second cousin on the maternal side was diagnosed with breast cancer in her early 48s. There is no history of ovarian cancer in the family.  GYNECOLOGIC HISTORY:  No LMP recorded. Patient is postmenopausal. Menarche age 81, first live birth age 3, the patient is Vanessa [name] P2. She underwent simple hysterectomy with bilateral salpingectomy 06/28/2010, with benign pathology([id number]). She still has her ovaries in place. 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 She describes herself is single. At home she lives with her daughter Vanessa [name], who teaches theater at [employer] middle school and is currently studying towards a Masters in counseling; and her son Vanessa [name], who is a professional basketball player in the international circuit (currently with the [employer]). The patient has no grandchildren. She attends a Tour manager    ADVANCED DIRECTIVES: Not in place. At the initial clinic visit 04/18/2016 the patient was given the appropriate forms to complete and notarize at her discretion.  HEALTH  MAINTENANCE: Social History  Substance Use Topics  . Smoking status: Never Smoker   . Smokeless tobacco: Never Used  . Alcohol Use: Yes     Comment: social     Colonoscopy: 2016/[facility]  PAP:  Bone density:  Lipid panel:  Allergies  Allergen Reactions  . Penicillins Anaphylaxis  . Cyclobenzaprine Swelling  . Hydrocodone Itching  . Meperidine Swelling  . Amoxicillin-Pot Clavulanate Rash  . Cefaclor Rash  . Codeine Rash  . Darvocet [Propoxyphene N-Acetaminophen] Rash  . Demerol Rash  . Erythromycin Rash  . Flexeril [Cyclobenzaprine Hcl] Rash  . Percocet [Oxycodone-Acetaminophen] Itching and Rash    Red streaks come up on skin  . Sulfa Antibiotics Rash    Current Outpatient Prescriptions  Medication Sig Dispense Refill  . cetirizine (ZYRTEC) 10 MG tablet Take 10 mg by mouth daily. Reported on 03/14/2016    . Docusate Sodium (COLACE PO) Take 1 tablet by mouth as needed.     . gabapentin (NEURONTIN) 300 MG capsule Take 1 capsule (300 mg total) by mouth at bedtime. 90 capsule 0  . gabapentin (NEURONTIN) 600 MG tablet TAKE 1 TABLET (600 MG TOTAL) BY MOUTH 3 (THREE) TIMES DAILY. 90 tablet 6  . lidocaine-prilocaine (EMLA) cream Apply  1 application topically as needed. 30 g 1  . Multiple Vitamins-Minerals (CENTRUM SILVER ADULT 50+ PO) Take by mouth. Reported on 03/14/2016    . ondansetron (ZOFRAN ODT) 4 MG disintegrating tablet Take 1 tablet (4 mg total) by mouth every 8 (eight) hours as needed for nausea or vomiting. 20 tablet 1  . senna (SENOKOT) 8.6 MG tablet Take 1 tablet by mouth as needed. Reported on 03/14/2016    . acetaminophen (TYLENOL) 325 MG tablet Take 650 mg by mouth every 6 (six) hours as needed. Reported on 04/18/2016     No current facility-administered medications for this visit.   Facility-Administered Medications Ordered in Other Visits  Medication Dose Route Frequency Provider Last Rate Last Dose  . heparin lock flush 100 unit/mL  500 Units Intracatheter Once PRN  Chauncey Cruel, MD      . sodium chloride flush (NS) 0.9 % injection 10 mL  10 mL Intracatheter PRN Chauncey Cruel, MD        OBJECTIVE: Middle-aged African-American woman  Filed Vitals:   04/18/16 1347  BP: 147/76  Pulse: 105  Temp: 98.4 F (36.9 C)  Resp: 18     Body mass index is 52.33 kg/(m^2).    ECOG FS:1 - Symptomatic but completely ambulatory  Skin: warm, dry  HEENT: sclerae anicteric, conjunctivae pink, oropharynx clear. No thrush or mucositis.  Lymph Nodes: No cervical or supraclavicular lymphadenopathy  Lungs: clear to auscultation bilaterally, no rales, wheezes, or rhonci  Heart: regular rate and rhythm  Abdomen: round, soft, non tender, positive bowel sounds  Musculoskeletal: No focal spinal tenderness, bilateral +2 lower extremity edema Neuro: non focal, well oriented, positive affect  Breasts: deferred  LAB RESULTS:  CMP     Component Value Date/Time   NA 142 04/18/2016 1242   NA 141 12/20/2015 1120   NA 143 04/25/2015 1148   K 3.5 04/18/2016 1242   K 3.3* 12/20/2015 1120   CL 106 12/20/2015 1120   CO2 27 04/18/2016 1242   CO2 28 12/20/2015 1120   GLUCOSE 104 04/18/2016 1242   GLUCOSE 100* 12/20/2015 1120   GLUCOSE 89 04/25/2015 1148   BUN 11.9 04/18/2016 1242   BUN 12 12/20/2015 1120   BUN 13 04/25/2015 1148   CREATININE 0.7 04/18/2016 1242   CREATININE 0.73 12/20/2015 1120   CREATININE 0.72 05/25/2014 1112   CALCIUM 8.8 04/18/2016 1242   CALCIUM 9.1 12/20/2015 1120   PROT 6.2* 04/18/2016 1242   PROT 7.4 12/20/2015 1120   ALBUMIN 3.0* 04/18/2016 1242   ALBUMIN 3.4* 12/20/2015 1120   AST 16 04/18/2016 1242   AST 19 12/20/2015 1120   ALT 12 04/18/2016 1242   ALT 14 12/20/2015 1120   ALKPHOS 78 04/18/2016 1242   ALKPHOS 105 12/20/2015 1120   BILITOT 0.38 04/18/2016 1242   BILITOT 0.3 12/20/2015 1120   GFRNONAA >60 12/20/2015 1120   GFRAA >60 12/20/2015 1120    INo results found for: SPEP, UPEP  Lab Results  Component Value Date    WBC 6.4 04/18/2016   NEUTROABS 3.6 04/18/2016   HGB 11.0* 04/18/2016   HCT 33.9* 04/18/2016   MCV 84.5 04/18/2016   PLT 263 04/18/2016      Chemistry      Component Value Date/Time   NA 142 04/18/2016 1242   NA 141 12/20/2015 1120   NA 143 04/25/2015 1148   K 3.5 04/18/2016 1242   K 3.3* 12/20/2015 1120   CL 106 12/20/2015 1120   CO2  27 04/18/2016 1242   CO2 28 12/20/2015 1120   BUN 11.9 04/18/2016 1242   BUN 12 12/20/2015 1120   BUN 13 04/25/2015 1148   CREATININE 0.7 04/18/2016 1242   CREATININE 0.73 12/20/2015 1120   CREATININE 0.72 05/25/2014 1112      Component Value Date/Time   CALCIUM 8.8 04/18/2016 1242   CALCIUM 9.1 12/20/2015 1120   ALKPHOS 78 04/18/2016 1242   ALKPHOS 105 12/20/2015 1120   AST 16 04/18/2016 1242   AST 19 12/20/2015 1120   ALT 12 04/18/2016 1242   ALT 14 12/20/2015 1120   BILITOT 0.38 04/18/2016 1242   BILITOT 0.3 12/20/2015 1120      No results found for: LABCA2  No components found for: LABCA125  No results for input(s): INR in the last 168 hours.  Urinalysis No results found for: COLORURINE, APPEARANCEUR, LABSPEC, PHURINE, GLUCOSEU, HGBUR, BILIRUBINUR, KETONESUR, PROTEINUR, UROBILINOGEN, NITRITE, LEUKOCYTESUR  STUDIES: No results found.  ASSESSMENT: 55 y.o. [place] woman status post right breast upper outer quadrant biopsy 11/15/2015 for a clinical T1c N0, stage IA invasive ductal carcinoma, grade 2, estrogen and progesterone receptor positive, HER-2 amplified, with an MIB-1 of 70%  (1) Genetics testing 12/22/2015 through the Hereditary Gene Panel offered by [facility] found no deleterious mutations in  APC, ATM, AXIN2, BARD1, BMPR1A, BRCA1, BRCA2, BRIP1, CDH1, CDKN2A, CHEK2, DICER1, EPCAM, GREM1, KIT, MEN1, MLH1, MSH2, MSH6, MUTYH, NBN, NF1, PALB2, PDGFRA, PMS2, POLD1, POLE, PTEN, RAD50, RAD51C, RAD51D, SDHA, SDHB, SDHC, SDHD, SMAD4, SMARCA4. STK11, TP53, TSC1, TSC2, and VHL.  (2) right lumpectomy with sentinel lymph node  sampling 12/29/2015 showed a pT1c pN0, stage IA invasive ductal carcinoma, grade 3, with negative though close margins (68m to DCIS)  (3) adjuvant chemotherapy with Abraxane weekly 12 with trastuzumab every 3 weeks to start 01/18/2016   (4) trastuzumab to be continued to total one year  (a) echo 12/18/2015 shows an EF of 65-70%  (5) adjuvant radiation to follow chemotherapy  (6) anti-estrogens to follow radiation  PLAN: KDashaylaand I discussed her neuropathy symptoms. We have come to the conclusion that it is best to leave the final cycle of abraxne off, for fear of worsening neuropathy symptoms. She will continue on '600mg'$  gabapentin twice daily, and '900mg'$  QHS which has been effective at treating her symptoms. She will proceed with trastuzumab alone at this point, every 3 weeks though February 2018.   For her leg swelling, I advised she keep them elevated, avoid sodium intake, and begin wearing compression socks daily. I have given her a written prescription for the socks. I have also written for '20mg'$  lasix daily x 7 days with 20 meq of potassium. If this does not help, she will give [place] call.   She is scheduled for radiation simulation on 5/22 and will begin daily therapy shortly after this.   KKaliwill return in mid July for follow up with Dr. MJana Hakim During this visit they will discuss antiestrogen therapy. She understands and agrees with this plan. She knows the goal of treatment in her case is cure. She has been encouraged to call with any issues that might arise before her next visit here.   HLaurie Panda NP   04/18/2016 3:59 PM

## 2016-04-18 NOTE — Patient Instructions (Signed)
[facility] Discharge Instructions for Patients Receiving Chemotherapy  Today you received the following chemotherapy agents herceptin  To help prevent nausea and vomiting after your treatment, we encourage you to take your nausea medication  If you develop nausea and vomiting that is not controlled by your nausea medication, call the clinic.   BELOW ARE SYMPTOMS THAT SHOULD BE REPORTED IMMEDIATELY:  *FEVER GREATER THAN 100.5 F  *CHILLS WITH OR WITHOUT FEVER  NAUSEA AND VOMITING THAT IS NOT CONTROLLED WITH YOUR NAUSEA MEDICATION  *UNUSUAL SHORTNESS OF BREATH  *UNUSUAL BRUISING OR BLEEDING  TENDERNESS IN MOUTH AND THROAT WITH OR WITHOUT PRESENCE OF ULCERS  *URINARY PROBLEMS  *BOWEL PROBLEMS  UNUSUAL RASH Items with * indicate a potential emergency and should be followed up as soon as possible.  Feel free to call the clinic you have any questions or concerns. The clinic phone number is [phone].  Please show the CHEMO ALERT CARD at check-in to the Emergency Department and triage nurse.   

## 2016-04-25 ENCOUNTER — Ambulatory Visit: Payer: BLUE CROSS/BLUE SHIELD | Admitting: Neurology

## 2016-04-26 ENCOUNTER — Telehealth: Payer: Self-pay | Admitting: *Deleted

## 2016-04-26 NOTE — Telephone Encounter (Signed)
Pt called to give this nurse an update about issues with swelling. Pt was not able to afford the compression hoses. She has been keeping legs elevated and now has gotten the Rx of lasix filled as of yesterday and started taking 1 tablet (20 mg) plus 20 mEq of potassium daily. She will takes Lasix for 7 days. Pt said she has seen a difference already. The swelling is less. Pt noticed a rash to her face this morning but think it might be due to the pollen, she will monitor and if it gets worse will seek care from her primary. Pt has a recent  dry cough, drainage is clear to whitish, pt states this sometimes happens when the seasons change. Denies fever. Pt will call this nurse back near the end of the final dose of lasix to let us know if the swelling went away. Message to be fwd to H.Boelter,NP

## 2016-04-29 ENCOUNTER — Ambulatory Visit
Admission: RE | Admit: 2016-04-29 | Discharge: 2016-04-29 | Disposition: A | Discharge status: Home / Self Care | Payer: BLUE CROSS/BLUE SHIELD | Source: Ambulatory Visit | Attending: Radiation Oncology | Admitting: Radiation Oncology

## 2016-04-29 DIAGNOSIS — Z90710 Acquired absence of both cervix and uterus: Secondary | ICD-10-CM | POA: Diagnosis not present

## 2016-04-29 DIAGNOSIS — C50411 Malignant neoplasm of upper-outer quadrant of right female breast: Secondary | ICD-10-CM | POA: Diagnosis not present

## 2016-04-29 DIAGNOSIS — Z510 Encounter for antineoplastic radiation therapy: Secondary | ICD-10-CM | POA: Diagnosis not present

## 2016-04-29 DIAGNOSIS — Z170 Estrogen receptor positive status [ER+]: Secondary | ICD-10-CM | POA: Diagnosis not present

## 2016-04-29 NOTE — Progress Notes (Addendum)
  Radiation Oncology         [phone] ________________________________  Name: Vanessa [name] MRN: [id number]  Date: 04/29/2016  DOB: 1961-09-17  SIMULATION AND TREATMENT PLANNING NOTE    opatient  DIAGNOSIS:     ICD-9-CM ICD-10-CM   1. Breast cancer of upper-outer quadrant of right female breast ([place]) 174.4 C50.411     NARRATIVE:  The patient was brought to the [facility].  Identity was confirmed.  All relevant records and images related to the planned course of therapy were reviewed.  The patient freely provided informed written consent to proceed with treatment after reviewing the details related to the planned course of therapy. The consent form was witnessed and verified by the simulation staff.    Then, the patient was set-up in a stable reproducible supine position for radiation therapy with her ipsilateral arm over her head, and her upper body secured in a custom-made Vac-lok device.  CT images were obtained.  Surface markings were placed.  The CT images were loaded into the planning software.    TREATMENT PLANNING NOTE: Treatment planning then occurred.  The radiation prescription was entered and confirmed.     A total of 3 medically necessary complex treatment devices were fabricated and supervised by me: 2 fields with MLCs for custom blocks to protect heart, and lungs;  and, a Vac-lok. MORE COMPLEX DEVICES MAY BE MADE IN DOSIMETRY FOR FIELD IN FIELD BEAMS FOR DOSE HOMOGENEITY.  I have requested : 3D Simulation  I have requested a DVH of the following structures: lungs, heart, lumpectomy cavity.    The patient will receive 50.4 Gy in 28 fractions to the right breast with 2 tangential fields.   This will be followed by a boost.  Optical Surface Tracking Plan:  Since intensity modulated radiotherapy (IMRT) and 3D conformal radiation treatment methods are predicated on accurate and precise positioning for treatment, intrafraction motion monitoring is medically  necessary to ensure accurate and safe treatment delivery. The ability to quantify intrafraction motion without excessive ionizing radiation dose can only be performed with optical surface tracking. Accordingly, surface imaging offers the opportunity to obtain 3D measurements of patient position throughout IMRT and 3D treatments without excessive radiation exposure. I am ordering optical surface tracking for this patient's upcoming course of radiotherapy.  ________________________________   Reference:  Ursula Alert, J, et al. Surface imaging-based analysis of intrafraction motion for breast radiotherapy patients.Journal of [place], n. 6, nov. 2014. ISSN [id number].  Available at: <http://www.jacmp.org/index.php/jacmp/article/view/4957>.    -----------------------------------  Eppie Gibson, MD

## 2016-05-01 ENCOUNTER — Other Ambulatory Visit: Payer: Self-pay

## 2016-05-01 DIAGNOSIS — Z90710 Acquired absence of both cervix and uterus: Secondary | ICD-10-CM | POA: Diagnosis not present

## 2016-05-01 DIAGNOSIS — Z170 Estrogen receptor positive status [ER+]: Secondary | ICD-10-CM | POA: Diagnosis not present

## 2016-05-01 DIAGNOSIS — C50411 Malignant neoplasm of upper-outer quadrant of right female breast: Secondary | ICD-10-CM | POA: Diagnosis not present

## 2016-05-01 DIAGNOSIS — Z510 Encounter for antineoplastic radiation therapy: Secondary | ICD-10-CM | POA: Diagnosis not present

## 2016-05-01 DIAGNOSIS — R600 Localized edema: Secondary | ICD-10-CM

## 2016-05-01 MED ORDER — FUROSEMIDE 20 MG PO TABS: 20.0000 mg | ORAL_TABLET | Freq: Every day | ORAL | Status: DC

## 2016-05-01 MED ORDER — KLOR-CON M10 10 MEQ PO TBCR: 20.0000 meq | EXTENDED_RELEASE_TABLET | Freq: Every day | ORAL | Status: DC

## 2016-05-01 NOTE — Telephone Encounter (Signed)
`  Vanessa [name] CALLED TO LET NURSE KNOW THAT THE SWELLING IN LEGS HAS COMPLETELY RESOLVED. She would like to take another week of the Lasix and potassium. Reviewed with Gentry Fitz, NP.  Sent in another prescription to her pharmacy.   If she notices any dizziness she should stop the lasix and call the [facility]. Ms. Coker verbalized understanding.

## 2016-05-09 ENCOUNTER — Ambulatory Visit (HOSPITAL_BASED_OUTPATIENT_CLINIC_OR_DEPARTMENT_OTHER): Payer: BLUE CROSS/BLUE SHIELD

## 2016-05-09 ENCOUNTER — Other Ambulatory Visit (HOSPITAL_BASED_OUTPATIENT_CLINIC_OR_DEPARTMENT_OTHER): Payer: BLUE CROSS/BLUE SHIELD

## 2016-05-09 ENCOUNTER — Ambulatory Visit: Payer: BLUE CROSS/BLUE SHIELD

## 2016-05-09 VITALS — BP 144/76 | HR 89 | Temp 98.1°F | Resp 18

## 2016-05-09 DIAGNOSIS — C50411 Malignant neoplasm of upper-outer quadrant of right female breast: Secondary | ICD-10-CM

## 2016-05-09 DIAGNOSIS — Z95828 Presence of other vascular implants and grafts: Secondary | ICD-10-CM

## 2016-05-09 DIAGNOSIS — Z5112 Encounter for antineoplastic immunotherapy: Secondary | ICD-10-CM | POA: Diagnosis not present

## 2016-05-09 LAB — COMPREHENSIVE METABOLIC PANEL
ALBUMIN: 3.5 g/dL (ref 3.5–5.0)
ALK PHOS: 80 U/L (ref 40–150)
ALT: 16 U/L (ref 0–55)
ANION GAP: 5 meq/L (ref 3–11)
AST: 18 U/L (ref 5–34)
BILIRUBIN TOTAL: 0.4 mg/dL (ref 0.20–1.20)
BUN: 12.8 mg/dL (ref 7.0–26.0)
CO2: 27 meq/L (ref 22–29)
CREATININE: 0.7 mg/dL (ref 0.6–1.1)
Calcium: 8.9 mg/dL (ref 8.4–10.4)
Chloride: 109 mEq/L (ref 98–109)
EGFR: >90 mL/min/{1.73_m2} (ref >90)
GLUCOSE: 112 mg/dL (ref 70–140)
Potassium: 3.5 mEq/L (ref 3.5–5.1)
Sodium: 141 mEq/L (ref 136–145)
TOTAL PROTEIN: 7.1 g/dL (ref 6.4–8.3)

## 2016-05-09 LAB — CBC WITH DIFFERENTIAL/PLATELET
BASO%: 0.9 % (ref 0.0–2.0)
Basophils Absolute: 0 10*3/uL (ref 0.0–0.1)
EOS ABS: 0.2 10*3/uL (ref 0.0–0.5)
EOS%: 5.4 % (ref 0.0–7.0)
HCT: 36 % (ref 34.8–46.6)
HEMOGLOBIN: 11.5 g/dL — AB (ref 11.6–15.9)
LYMPH#: 1.9 10*3/uL (ref 0.9–3.3)
LYMPH%: 46.3 % (ref 14.0–49.7)
MCH: 27.1 pg (ref 25.1–34.0)
MCHC: 31.9 g/dL (ref 31.5–36.0)
MCV: 85 fL (ref 79.5–101.0)
MONO#: 0.3 10*3/uL (ref 0.1–0.9)
MONO%: 7.9 % (ref 0.0–14.0)
NEUT%: 39.5 % (ref 38.4–76.8)
NEUTROS ABS: 1.7 10*3/uL (ref 1.5–6.5)
PLATELETS: 249 10*3/uL (ref 145–400)
RBC: 4.24 10*6/uL (ref 3.70–5.45)
RDW: 16.3 % — AB (ref 11.2–14.5)
WBC: 4.2 10*3/uL (ref 3.9–10.3)

## 2016-05-09 MED ORDER — DIPHENHYDRAMINE HCL 25 MG PO CAPS
ORAL_CAPSULE | ORAL | Status: AC
  Filled 2016-05-09: qty 2

## 2016-05-09 MED ORDER — SODIUM CHLORIDE 0.9% FLUSH
10.0000 mL | INTRAVENOUS | Status: DC | PRN
  Administered 2016-05-09: 10 mL
  Filled 2016-05-09: qty 10

## 2016-05-09 MED ORDER — HEPARIN SOD (PORK) LOCK FLUSH 100 UNIT/ML IV SOLN
500.0000 [IU] | Freq: Once | INTRAVENOUS | Status: AC | PRN
  Administered 2016-05-09: 500 [IU]
  Filled 2016-05-09: qty 5

## 2016-05-09 MED ORDER — ACETAMINOPHEN 325 MG PO TABS
ORAL_TABLET | ORAL | Status: AC
  Filled 2016-05-09: qty 2

## 2016-05-09 MED ORDER — SODIUM CHLORIDE 0.9 % IV SOLN
Freq: Once | INTRAVENOUS | Status: AC
  Administered 2016-05-09: 13:00:00 via INTRAVENOUS

## 2016-05-09 MED ORDER — TRASTUZUMAB CHEMO INJECTION 440 MG
6.0000 mg/kg | Freq: Once | INTRAVENOUS | Status: AC
  Administered 2016-05-09: 840 mg via INTRAVENOUS
  Filled 2016-05-09: qty 40

## 2016-05-09 MED ORDER — ACETAMINOPHEN 325 MG PO TABS
650.0000 mg | ORAL_TABLET | Freq: Once | ORAL | Status: AC
  Administered 2016-05-09: 650 mg via ORAL

## 2016-05-09 MED ORDER — DIPHENHYDRAMINE HCL 25 MG PO CAPS
50.0000 mg | ORAL_CAPSULE | Freq: Once | ORAL | Status: AC
  Administered 2016-05-09: 50 mg via ORAL

## 2016-05-09 MED ORDER — SODIUM CHLORIDE 0.9 % IJ SOLN
10.0000 mL | INTRAMUSCULAR | Status: DC | PRN
  Administered 2016-05-09: 10 mL via INTRAVENOUS
  Filled 2016-05-09: qty 10

## 2016-05-09 NOTE — Patient Instructions (Signed)
[facility] Discharge Instructions for Patients Receiving Chemotherapy  Today you received the following chemotherapy agents:  Herceptin  To help prevent nausea and vomiting after your treatment, we encourage you to take your nausea medication as prescribed.   If you develop nausea and vomiting that is not controlled by your nausea medication, call the clinic.   BELOW ARE SYMPTOMS THAT SHOULD BE REPORTED IMMEDIATELY:  *FEVER GREATER THAN 100.5 F  *CHILLS WITH OR WITHOUT FEVER  NAUSEA AND VOMITING THAT IS NOT CONTROLLED WITH YOUR NAUSEA MEDICATION  *UNUSUAL SHORTNESS OF BREATH  *UNUSUAL BRUISING OR BLEEDING  TENDERNESS IN MOUTH AND THROAT WITH OR WITHOUT PRESENCE OF ULCERS  *URINARY PROBLEMS  *BOWEL PROBLEMS  UNUSUAL RASH Items with * indicate a potential emergency and should be followed up as soon as possible.  Feel free to call the clinic you have any questions or concerns. The clinic phone number is [phone].  Please show the CHEMO ALERT CARD at check-in to the Emergency Department and triage nurse.   

## 2016-05-10 ENCOUNTER — Ambulatory Visit
Admission: RE | Admit: 2016-05-10 | Discharge: 2016-05-10 | Disposition: A | Discharge status: Home / Self Care | Payer: BLUE CROSS/BLUE SHIELD | Source: Ambulatory Visit | Attending: Radiation Oncology | Admitting: Radiation Oncology

## 2016-05-10 DIAGNOSIS — Z510 Encounter for antineoplastic radiation therapy: Secondary | ICD-10-CM | POA: Diagnosis not present

## 2016-05-10 DIAGNOSIS — C50411 Malignant neoplasm of upper-outer quadrant of right female breast: Secondary | ICD-10-CM | POA: Diagnosis not present

## 2016-05-10 DIAGNOSIS — Z90710 Acquired absence of both cervix and uterus: Secondary | ICD-10-CM | POA: Diagnosis not present

## 2016-05-10 DIAGNOSIS — Z170 Estrogen receptor positive status [ER+]: Secondary | ICD-10-CM | POA: Diagnosis not present

## 2016-05-13 ENCOUNTER — Ambulatory Visit
Admission: RE | Admit: 2016-05-13 | Discharge: 2016-05-13 | Disposition: A | Discharge status: Home / Self Care | Payer: BLUE CROSS/BLUE SHIELD | Source: Ambulatory Visit | Attending: Radiation Oncology | Admitting: Radiation Oncology

## 2016-05-13 ENCOUNTER — Encounter: Payer: Self-pay | Admitting: Radiation Oncology

## 2016-05-13 VITALS — BP 131/86 | HR 85 | Temp 98.3°F | Ht 64.0 in | Wt 293.3 lb

## 2016-05-13 DIAGNOSIS — Z170 Estrogen receptor positive status [ER+]: Secondary | ICD-10-CM | POA: Diagnosis not present

## 2016-05-13 DIAGNOSIS — C50411 Malignant neoplasm of upper-outer quadrant of right female breast: Secondary | ICD-10-CM

## 2016-05-13 DIAGNOSIS — Z90710 Acquired absence of both cervix and uterus: Secondary | ICD-10-CM | POA: Diagnosis not present

## 2016-05-13 DIAGNOSIS — Z510 Encounter for antineoplastic radiation therapy: Secondary | ICD-10-CM | POA: Diagnosis not present

## 2016-05-13 MED ORDER — ALRA NON-METALLIC DEODORANT (RAD-ONC)
1.0000 "application " | Freq: Once | TOPICAL | Status: AC
  Administered 2016-05-13: 1 via TOPICAL

## 2016-05-13 MED ORDER — RADIAPLEXRX EX GEL
Freq: Once | CUTANEOUS | Status: AC
  Administered 2016-05-13: 17:00:00 via TOPICAL

## 2016-05-13 NOTE — Progress Notes (Signed)

## 2016-05-13 NOTE — Progress Notes (Signed)
   Weekly Management Note:  outpatient    ICD-9-CM ICD-10-CM   1. Breast cancer of upper-outer quadrant of right female breast ([place]) 174.4 C50.411     Current Dose:  1.8 Gy  Projected Dose: 60.4 Gy   Narrative:  The patient presents for routine under treatment assessment.  CBCT/MVCT images/Port film x-rays were reviewed.  The chart was checked. Doing well  Physical Findings:  height is 5\' 4"  (1.626 m) and weight is 293 lb 4.8 oz (133.04 kg). Her temperature is 98.3 F (36.8 C). Her blood pressure is 131/86 and her pulse is 85.   Wt Readings from Last 3 Encounters:  05/13/16 293 lb 4.8 oz (133.04 kg)  04/18/16 305 lb (138.347 kg)  04/11/16 301 lb 11.2 oz (136.85 kg)   Well appearing  Impression:  The patient is tolerating radiotherapy.  Plan:  Continue radiotherapy as planned. Patient instructed to apply Radiplex to intact skin in treatment fields.      ________________________________   Eppie Gibson, M.D.

## 2016-05-13 NOTE — Progress Notes (Signed)
Vanessa [name] is here for her 1st fraction of radiaiton to her Right Breast. She is feeling well, and denies pain. She was provided education today and Radiaplex cream to use twice daily.   BP 131/86 mmHg  Pulse 85  Temp(Src) 98.3 F (36.8 C)  Ht 5\' 4"  (1.626 m)  Wt 293 lb 4.8 oz (133.04 kg)  BMI 50.32 kg/m2

## 2016-05-14 ENCOUNTER — Ambulatory Visit
Admission: RE | Admit: 2016-05-14 | Discharge: 2016-05-14 | Disposition: A | Discharge status: Home / Self Care | Payer: BLUE CROSS/BLUE SHIELD | Source: Ambulatory Visit | Attending: Radiation Oncology | Admitting: Radiation Oncology

## 2016-05-14 DIAGNOSIS — Z510 Encounter for antineoplastic radiation therapy: Secondary | ICD-10-CM | POA: Diagnosis not present

## 2016-05-14 DIAGNOSIS — Z170 Estrogen receptor positive status [ER+]: Secondary | ICD-10-CM | POA: Diagnosis not present

## 2016-05-14 DIAGNOSIS — C50411 Malignant neoplasm of upper-outer quadrant of right female breast: Secondary | ICD-10-CM | POA: Diagnosis not present

## 2016-05-14 DIAGNOSIS — Z90710 Acquired absence of both cervix and uterus: Secondary | ICD-10-CM | POA: Diagnosis not present

## 2016-05-15 ENCOUNTER — Ambulatory Visit
Admission: RE | Admit: 2016-05-15 | Discharge: 2016-05-15 | Disposition: A | Discharge status: Home / Self Care | Payer: BLUE CROSS/BLUE SHIELD | Source: Ambulatory Visit | Attending: Radiation Oncology | Admitting: Radiation Oncology

## 2016-05-15 DIAGNOSIS — Z170 Estrogen receptor positive status [ER+]: Secondary | ICD-10-CM | POA: Diagnosis not present

## 2016-05-15 DIAGNOSIS — Z90710 Acquired absence of both cervix and uterus: Secondary | ICD-10-CM | POA: Diagnosis not present

## 2016-05-15 DIAGNOSIS — C50411 Malignant neoplasm of upper-outer quadrant of right female breast: Secondary | ICD-10-CM | POA: Diagnosis not present

## 2016-05-15 DIAGNOSIS — Z510 Encounter for antineoplastic radiation therapy: Secondary | ICD-10-CM | POA: Diagnosis not present

## 2016-05-16 ENCOUNTER — Ambulatory Visit
Admission: RE | Admit: 2016-05-16 | Discharge: 2016-05-16 | Disposition: A | Discharge status: Home / Self Care | Payer: BLUE CROSS/BLUE SHIELD | Source: Ambulatory Visit | Attending: Radiation Oncology | Admitting: Radiation Oncology

## 2016-05-16 DIAGNOSIS — Z90710 Acquired absence of both cervix and uterus: Secondary | ICD-10-CM | POA: Diagnosis not present

## 2016-05-16 DIAGNOSIS — C50411 Malignant neoplasm of upper-outer quadrant of right female breast: Secondary | ICD-10-CM | POA: Diagnosis not present

## 2016-05-16 DIAGNOSIS — Z170 Estrogen receptor positive status [ER+]: Secondary | ICD-10-CM | POA: Diagnosis not present

## 2016-05-16 DIAGNOSIS — Z510 Encounter for antineoplastic radiation therapy: Secondary | ICD-10-CM | POA: Diagnosis not present

## 2016-05-17 ENCOUNTER — Ambulatory Visit
Admission: RE | Admit: 2016-05-17 | Discharge: 2016-05-17 | Disposition: A | Discharge status: Home / Self Care | Payer: BLUE CROSS/BLUE SHIELD | Source: Ambulatory Visit | Attending: Radiation Oncology | Admitting: Radiation Oncology

## 2016-05-17 DIAGNOSIS — Z510 Encounter for antineoplastic radiation therapy: Secondary | ICD-10-CM | POA: Diagnosis not present

## 2016-05-17 DIAGNOSIS — C50411 Malignant neoplasm of upper-outer quadrant of right female breast: Secondary | ICD-10-CM | POA: Diagnosis not present

## 2016-05-17 DIAGNOSIS — Z90710 Acquired absence of both cervix and uterus: Secondary | ICD-10-CM | POA: Diagnosis not present

## 2016-05-17 DIAGNOSIS — Z170 Estrogen receptor positive status [ER+]: Secondary | ICD-10-CM | POA: Diagnosis not present

## 2016-05-20 ENCOUNTER — Ambulatory Visit
Admission: RE | Admit: 2016-05-20 | Discharge: 2016-05-20 | Disposition: A | Discharge status: Home / Self Care | Payer: BLUE CROSS/BLUE SHIELD | Source: Ambulatory Visit | Attending: Radiation Oncology | Admitting: Radiation Oncology

## 2016-05-20 ENCOUNTER — Encounter: Payer: Self-pay | Admitting: Radiation Oncology

## 2016-05-20 VITALS — BP 129/61 | HR 95 | Temp 98.1°F | Ht 64.0 in | Wt 292.1 lb

## 2016-05-20 DIAGNOSIS — Z510 Encounter for antineoplastic radiation therapy: Secondary | ICD-10-CM | POA: Diagnosis not present

## 2016-05-20 DIAGNOSIS — C50411 Malignant neoplasm of upper-outer quadrant of right female breast: Secondary | ICD-10-CM | POA: Diagnosis not present

## 2016-05-20 DIAGNOSIS — Z170 Estrogen receptor positive status [ER+]: Secondary | ICD-10-CM | POA: Diagnosis not present

## 2016-05-20 DIAGNOSIS — Z90710 Acquired absence of both cervix and uterus: Secondary | ICD-10-CM | POA: Diagnosis not present

## 2016-05-20 NOTE — Progress Notes (Signed)
   Weekly Management Note:  outpatient    ICD-9-CM ICD-10-CM   1. Breast cancer of upper-outer quadrant of right female breast ([place]) 174.4 C50.411     Current Dose:  10.8 Gy  Projected Dose: 60.4 Gy   Narrative:  The patient presents for routine under treatment assessment.  CBCT/MVCT images/Port film x-rays were reviewed.  The chart was checked. Doing well; occ twinges in R breast.   Physical Findings:  height is 5\' 4"  (1.626 m) and weight is 292 lb 1.6 oz (132.496 kg). Her temperature is 98.1 F (36.7 C). Her blood pressure is 129/61 and her pulse is 95.   Wt Readings from Last 3 Encounters:  05/20/16 292 lb 1.6 oz (132.496 kg)  05/13/16 293 lb 4.8 oz (133.04 kg)  04/18/16 305 lb (138.347 kg)   Well appearing, right breast slightly hyperpigmented  Impression:  The patient is tolerating radiotherapy.  Plan:  Continue radiotherapy as planned. Patient instructed to apply Radiplex to intact skin in treatment fields.      ________________________________   Eppie Gibson, M.D.

## 2016-05-20 NOTE — Progress Notes (Signed)
Vanessa [name] is here for her 6th fraction of radiation to her Right Breast. She denies pain, but does report occasional "twinges" in her Right Breast which improves with Tylenol and Neurontin. Her Right Breast is slightly red underneath her nipple. It is not tender. She is using the Radiaplex cream 2-3 times daily.   BP 129/61 mmHg  Pulse 95  Temp(Src) 98.1 F (36.7 C)  Ht 5\' 4"  (1.626 m)  Wt 292 lb 1.6 oz (132.496 kg)  BMI 50.11 kg/m2   Wt Readings from Last 3 Encounters:  05/20/16 292 lb 1.6 oz (132.496 kg)  05/13/16 293 lb 4.8 oz (133.04 kg)  04/18/16 305 lb (138.347 kg)

## 2016-05-21 ENCOUNTER — Ambulatory Visit
Admission: RE | Admit: 2016-05-21 | Discharge: 2016-05-21 | Disposition: A | Discharge status: Home / Self Care | Payer: BLUE CROSS/BLUE SHIELD | Source: Ambulatory Visit | Attending: Radiation Oncology | Admitting: Radiation Oncology

## 2016-05-21 DIAGNOSIS — Z510 Encounter for antineoplastic radiation therapy: Secondary | ICD-10-CM | POA: Diagnosis not present

## 2016-05-21 DIAGNOSIS — Z90710 Acquired absence of both cervix and uterus: Secondary | ICD-10-CM | POA: Diagnosis not present

## 2016-05-21 DIAGNOSIS — Z170 Estrogen receptor positive status [ER+]: Secondary | ICD-10-CM | POA: Diagnosis not present

## 2016-05-21 DIAGNOSIS — C50411 Malignant neoplasm of upper-outer quadrant of right female breast: Secondary | ICD-10-CM | POA: Diagnosis not present

## 2016-05-22 ENCOUNTER — Ambulatory Visit
Admission: RE | Admit: 2016-05-22 | Discharge: 2016-05-22 | Disposition: A | Discharge status: Home / Self Care | Payer: BLUE CROSS/BLUE SHIELD | Source: Ambulatory Visit | Attending: Radiation Oncology | Admitting: Radiation Oncology

## 2016-05-22 DIAGNOSIS — Z510 Encounter for antineoplastic radiation therapy: Secondary | ICD-10-CM | POA: Diagnosis not present

## 2016-05-22 DIAGNOSIS — Z90710 Acquired absence of both cervix and uterus: Secondary | ICD-10-CM | POA: Diagnosis not present

## 2016-05-22 DIAGNOSIS — C50411 Malignant neoplasm of upper-outer quadrant of right female breast: Secondary | ICD-10-CM | POA: Diagnosis not present

## 2016-05-22 DIAGNOSIS — Z170 Estrogen receptor positive status [ER+]: Secondary | ICD-10-CM | POA: Diagnosis not present

## 2016-05-23 ENCOUNTER — Ambulatory Visit
Admission: RE | Admit: 2016-05-23 | Discharge: 2016-05-23 | Disposition: A | Discharge status: Home / Self Care | Payer: BLUE CROSS/BLUE SHIELD | Source: Ambulatory Visit | Attending: Radiation Oncology | Admitting: Radiation Oncology

## 2016-05-23 DIAGNOSIS — C50411 Malignant neoplasm of upper-outer quadrant of right female breast: Secondary | ICD-10-CM | POA: Diagnosis not present

## 2016-05-23 DIAGNOSIS — Z170 Estrogen receptor positive status [ER+]: Secondary | ICD-10-CM | POA: Diagnosis not present

## 2016-05-23 DIAGNOSIS — Z510 Encounter for antineoplastic radiation therapy: Secondary | ICD-10-CM | POA: Diagnosis not present

## 2016-05-23 DIAGNOSIS — Z90710 Acquired absence of both cervix and uterus: Secondary | ICD-10-CM | POA: Diagnosis not present

## 2016-05-24 ENCOUNTER — Ambulatory Visit
Admission: RE | Admit: 2016-05-24 | Discharge: 2016-05-24 | Disposition: A | Discharge status: Home / Self Care | Payer: BLUE CROSS/BLUE SHIELD | Source: Ambulatory Visit | Attending: Radiation Oncology | Admitting: Radiation Oncology

## 2016-05-24 ENCOUNTER — Telehealth: Payer: Self-pay | Admitting: *Deleted

## 2016-05-24 DIAGNOSIS — Z170 Estrogen receptor positive status [ER+]: Secondary | ICD-10-CM | POA: Diagnosis not present

## 2016-05-24 DIAGNOSIS — Z90710 Acquired absence of both cervix and uterus: Secondary | ICD-10-CM | POA: Diagnosis not present

## 2016-05-24 DIAGNOSIS — Z510 Encounter for antineoplastic radiation therapy: Secondary | ICD-10-CM | POA: Diagnosis not present

## 2016-05-24 DIAGNOSIS — C50411 Malignant neoplasm of upper-outer quadrant of right female breast: Secondary | ICD-10-CM | POA: Diagnosis not present

## 2016-05-24 NOTE — Telephone Encounter (Signed)
Spoke with patient to follow up after start of radiation.  She states she is doing well. She has had some shooting pains in her breast.  She is taking her neurontin but she states she has some tramadol and was asking if she could take that as well.  Informed her that would be ok but that the tramadol may make her drowsy as well.  She states she would probably take it at night.

## 2016-05-27 ENCOUNTER — Ambulatory Visit
Admission: RE | Admit: 2016-05-27 | Discharge: 2016-05-27 | Disposition: A | Discharge status: Home / Self Care | Payer: BLUE CROSS/BLUE SHIELD | Source: Ambulatory Visit | Attending: Radiation Oncology | Admitting: Radiation Oncology

## 2016-05-27 ENCOUNTER — Encounter: Payer: Self-pay | Admitting: Radiation Oncology

## 2016-05-27 VITALS — BP 140/90 | HR 93 | Temp 97.8°F | Ht 64.0 in | Wt 287.8 lb

## 2016-05-27 DIAGNOSIS — Z170 Estrogen receptor positive status [ER+]: Secondary | ICD-10-CM | POA: Diagnosis not present

## 2016-05-27 DIAGNOSIS — C50411 Malignant neoplasm of upper-outer quadrant of right female breast: Secondary | ICD-10-CM | POA: Diagnosis not present

## 2016-05-27 DIAGNOSIS — Z510 Encounter for antineoplastic radiation therapy: Secondary | ICD-10-CM | POA: Diagnosis not present

## 2016-05-27 DIAGNOSIS — Z90710 Acquired absence of both cervix and uterus: Secondary | ICD-10-CM | POA: Diagnosis not present

## 2016-05-27 MED ORDER — RADIAPLEXRX EX GEL
Freq: Once | CUTANEOUS | Status: AC
  Administered 2016-05-27: 17:00:00 via TOPICAL

## 2016-05-27 MED ORDER — TRAMADOL HCL 50 MG PO TABS: 50.0000 mg | ORAL_TABLET | Freq: Two times a day (BID) | ORAL | Status: DC | PRN

## 2016-05-27 NOTE — Progress Notes (Signed)
Ms. Belgrave is here for her 11th fraction of radiation to her Right Breast. She denies pain at this time. She does report shooting pain from the lateral side of her Right Breast to her nipple that started last week. She has taken tramadol with relief. Her Right Breast is red and tender to touch. She is using the Radiaplex cream as directed. She has some fatigue, but states she is sleeping better at night.   BP 140/90 mmHg  Pulse 93  Temp(Src) 97.8 F (36.6 C)  Ht 5\' 4"  (1.626 m)  Wt 287 lb 12.8 oz (130.545 kg)  BMI 49.38 kg/m2   Wt Readings from Last 3 Encounters:  05/27/16 287 lb 12.8 oz (130.545 kg)  05/20/16 292 lb 1.6 oz (132.496 kg)  05/13/16 293 lb 4.8 oz (133.04 kg)

## 2016-05-27 NOTE — Addendum Note (Signed)
Encounter addended by: Ernst Spell, RN on: 05/27/2016  4:31 PM<BR>     Documentation filed: Inpatient MAR

## 2016-05-27 NOTE — Progress Notes (Signed)
   Weekly Management Note:  outpatient    ICD-9-CM ICD-10-CM   1. Breast cancer of upper-outer quadrant of right female breast (HCC) 174.4 C50.411 hyaluronate sodium (RADIAPLEXRX) gel     traMADol (ULTRAM) 50 MG tablet    Current Dose:  19.8 Gy  Projected Dose: 60.4 Gy   Narrative:  The patient presents for routine under treatment assessment.  CBCT/MVCT images/Port film x-rays were reviewed.  The chart was checked. Doing well;more sharp pains/ twinges in R breast - alleviated by Tramadol which she takes occasionally  Physical Findings:  height is 5\' 4"  (1.626 m) and weight is 287 lb 12.8 oz (130.545 kg). Her temperature is 97.8 F (36.6 C). Her blood pressure is 140/90 and her pulse is 93.   Wt Readings from Last 3 Encounters:  05/27/16 287 lb 12.8 oz (130.545 kg)  05/20/16 292 lb 1.6 oz (132.496 kg)  05/13/16 293 lb 4.8 oz (133.04 kg)   Well appearing, right breast slightly hyperpigmented, skin intact  Impression:  The patient is tolerating radiotherapy.  Plan:  Continue radiotherapy as planned. Patient instructed to apply Radiplex to intact skin in treatment fields. Tramadol refilled.     ________________________________   Eppie Gibson, M.D.

## 2016-05-28 ENCOUNTER — Ambulatory Visit
Admission: RE | Admit: 2016-05-28 | Discharge: 2016-05-28 | Disposition: A | Discharge status: Home / Self Care | Payer: BLUE CROSS/BLUE SHIELD | Source: Ambulatory Visit | Attending: Radiation Oncology | Admitting: Radiation Oncology

## 2016-05-28 DIAGNOSIS — Z170 Estrogen receptor positive status [ER+]: Secondary | ICD-10-CM | POA: Diagnosis not present

## 2016-05-28 DIAGNOSIS — Z90710 Acquired absence of both cervix and uterus: Secondary | ICD-10-CM | POA: Diagnosis not present

## 2016-05-28 DIAGNOSIS — Z510 Encounter for antineoplastic radiation therapy: Secondary | ICD-10-CM | POA: Diagnosis not present

## 2016-05-28 DIAGNOSIS — C50411 Malignant neoplasm of upper-outer quadrant of right female breast: Secondary | ICD-10-CM | POA: Diagnosis not present

## 2016-05-29 ENCOUNTER — Ambulatory Visit
Admission: RE | Admit: 2016-05-29 | Discharge: 2016-05-29 | Disposition: A | Discharge status: Home / Self Care | Payer: BLUE CROSS/BLUE SHIELD | Source: Ambulatory Visit | Attending: Radiation Oncology | Admitting: Radiation Oncology

## 2016-05-29 DIAGNOSIS — Z170 Estrogen receptor positive status [ER+]: Secondary | ICD-10-CM | POA: Diagnosis not present

## 2016-05-29 DIAGNOSIS — C50411 Malignant neoplasm of upper-outer quadrant of right female breast: Secondary | ICD-10-CM | POA: Diagnosis not present

## 2016-05-29 DIAGNOSIS — Z90710 Acquired absence of both cervix and uterus: Secondary | ICD-10-CM | POA: Diagnosis not present

## 2016-05-29 DIAGNOSIS — Z510 Encounter for antineoplastic radiation therapy: Secondary | ICD-10-CM | POA: Diagnosis not present

## 2016-05-30 ENCOUNTER — Encounter: Payer: Self-pay | Admitting: *Deleted

## 2016-05-30 ENCOUNTER — Ambulatory Visit (HOSPITAL_BASED_OUTPATIENT_CLINIC_OR_DEPARTMENT_OTHER): Payer: BLUE CROSS/BLUE SHIELD

## 2016-05-30 ENCOUNTER — Other Ambulatory Visit (HOSPITAL_BASED_OUTPATIENT_CLINIC_OR_DEPARTMENT_OTHER): Payer: BLUE CROSS/BLUE SHIELD

## 2016-05-30 ENCOUNTER — Ambulatory Visit
Admission: RE | Admit: 2016-05-30 | Discharge: 2016-05-30 | Disposition: A | Discharge status: Home / Self Care | Payer: BLUE CROSS/BLUE SHIELD | Source: Ambulatory Visit | Attending: Radiation Oncology | Admitting: Radiation Oncology

## 2016-05-30 VITALS — BP 136/70 | HR 83 | Temp 98.4°F | Resp 18

## 2016-05-30 DIAGNOSIS — Z170 Estrogen receptor positive status [ER+]: Secondary | ICD-10-CM | POA: Diagnosis not present

## 2016-05-30 DIAGNOSIS — C50411 Malignant neoplasm of upper-outer quadrant of right female breast: Secondary | ICD-10-CM | POA: Diagnosis not present

## 2016-05-30 DIAGNOSIS — Z510 Encounter for antineoplastic radiation therapy: Secondary | ICD-10-CM | POA: Diagnosis not present

## 2016-05-30 DIAGNOSIS — Z5112 Encounter for antineoplastic immunotherapy: Secondary | ICD-10-CM

## 2016-05-30 DIAGNOSIS — Z90710 Acquired absence of both cervix and uterus: Secondary | ICD-10-CM | POA: Diagnosis not present

## 2016-05-30 LAB — COMPREHENSIVE METABOLIC PANEL
ALT: 19 U/L (ref 0–55)
ANION GAP: 7 meq/L (ref 3–11)
AST: 21 U/L (ref 5–34)
Albumin: 3.4 g/dL — ABNORMAL LOW (ref 3.5–5.0)
Alkaline Phosphatase: 91 U/L (ref 40–150)
BILIRUBIN TOTAL: 0.45 mg/dL (ref 0.20–1.20)
BUN: 9.2 mg/dL (ref 7.0–26.0)
CHLORIDE: 108 meq/L (ref 98–109)
CO2: 28 meq/L (ref 22–29)
Calcium: 9 mg/dL (ref 8.4–10.4)
Creatinine: 0.7 mg/dL (ref 0.6–1.1)
GLUCOSE: 105 mg/dL (ref 70–140)
POTASSIUM: 3.4 meq/L — AB (ref 3.5–5.1)
SODIUM: 143 meq/L (ref 136–145)
Total Protein: 7.2 g/dL (ref 6.4–8.3)

## 2016-05-30 LAB — CBC WITH DIFFERENTIAL/PLATELET
BASO%: 0.8 % (ref 0.0–2.0)
Basophils Absolute: 0 10*3/uL (ref 0.0–0.1)
EOS ABS: 0.2 10*3/uL (ref 0.0–0.5)
EOS%: 5.2 % (ref 0.0–7.0)
HCT: 37.2 % (ref 34.8–46.6)
HGB: 11.8 g/dL (ref 11.6–15.9)
LYMPH%: 38.8 % (ref 14.0–49.7)
MCH: 26.7 pg (ref 25.1–34.0)
MCHC: 31.6 g/dL (ref 31.5–36.0)
MCV: 84.3 fL (ref 79.5–101.0)
MONO#: 0.3 10*3/uL (ref 0.1–0.9)
MONO%: 9.5 % (ref 0.0–14.0)
NEUT#: 1.6 10*3/uL (ref 1.5–6.5)
NEUT%: 45.7 % (ref 38.4–76.8)
PLATELETS: 227 10*3/uL (ref 145–400)
RBC: 4.42 10*6/uL (ref 3.70–5.45)
RDW: 15.2 % — ABNORMAL HIGH (ref 11.2–14.5)
WBC: 3.6 10*3/uL — ABNORMAL LOW (ref 3.9–10.3)
lymph#: 1.4 10*3/uL (ref 0.9–3.3)

## 2016-05-30 MED ORDER — ACETAMINOPHEN 325 MG PO TABS
650.0000 mg | ORAL_TABLET | Freq: Once | ORAL | Status: AC
  Administered 2016-05-30: 650 mg via ORAL

## 2016-05-30 MED ORDER — TRASTUZUMAB CHEMO INJECTION 440 MG
6.0000 mg/kg | Freq: Once | INTRAVENOUS | Status: AC
  Administered 2016-05-30: 840 mg via INTRAVENOUS
  Filled 2016-05-30: qty 40

## 2016-05-30 MED ORDER — SODIUM CHLORIDE 0.9 % IV SOLN
Freq: Once | INTRAVENOUS | Status: AC
  Administered 2016-05-30: 11:00:00 via INTRAVENOUS

## 2016-05-30 MED ORDER — ACETAMINOPHEN 325 MG PO TABS
ORAL_TABLET | ORAL | Status: AC
  Filled 2016-05-30: qty 2

## 2016-05-30 MED ORDER — SODIUM CHLORIDE 0.9% FLUSH
10.0000 mL | INTRAVENOUS | Status: DC | PRN
  Administered 2016-05-30: 10 mL
  Filled 2016-05-30: qty 10

## 2016-05-30 MED ORDER — HEPARIN SOD (PORK) LOCK FLUSH 100 UNIT/ML IV SOLN
500.0000 [IU] | Freq: Once | INTRAVENOUS | Status: AC | PRN
  Administered 2016-05-30: 500 [IU]
  Filled 2016-05-30: qty 5

## 2016-05-30 MED ORDER — DIPHENHYDRAMINE HCL 25 MG PO CAPS
ORAL_CAPSULE | ORAL | Status: AC
  Filled 2016-05-30: qty 2

## 2016-05-30 MED ORDER — DIPHENHYDRAMINE HCL 25 MG PO CAPS
50.0000 mg | ORAL_CAPSULE | Freq: Once | ORAL | Status: AC
  Administered 2016-05-30: 50 mg via ORAL

## 2016-05-30 NOTE — Patient Instructions (Signed)
[facility] Discharge Instructions for Patients Receiving Chemotherapy  Today you received the following chemotherapy agents:  Herceptin  To help prevent nausea and vomiting after your treatment, we encourage you to take your nausea medication as prescribed.   If you develop nausea and vomiting that is not controlled by your nausea medication, call the clinic.   BELOW ARE SYMPTOMS THAT SHOULD BE REPORTED IMMEDIATELY:  *FEVER GREATER THAN 100.5 F  *CHILLS WITH OR WITHOUT FEVER  NAUSEA AND VOMITING THAT IS NOT CONTROLLED WITH YOUR NAUSEA MEDICATION  *UNUSUAL SHORTNESS OF BREATH  *UNUSUAL BRUISING OR BLEEDING  TENDERNESS IN MOUTH AND THROAT WITH OR WITHOUT PRESENCE OF ULCERS  *URINARY PROBLEMS  *BOWEL PROBLEMS  UNUSUAL RASH Items with * indicate a potential emergency and should be followed up as soon as possible.  Feel free to call the clinic you have any questions or concerns. The clinic phone number is [phone].  Please show the CHEMO ALERT CARD at check-in to the Emergency Department and triage nurse.   

## 2016-05-31 ENCOUNTER — Ambulatory Visit
Admission: RE | Admit: 2016-05-31 | Discharge: 2016-05-31 | Disposition: A | Discharge status: Home / Self Care | Payer: BLUE CROSS/BLUE SHIELD | Source: Ambulatory Visit | Attending: Radiation Oncology | Admitting: Radiation Oncology

## 2016-05-31 DIAGNOSIS — Z90710 Acquired absence of both cervix and uterus: Secondary | ICD-10-CM | POA: Diagnosis not present

## 2016-05-31 DIAGNOSIS — C50411 Malignant neoplasm of upper-outer quadrant of right female breast: Secondary | ICD-10-CM | POA: Diagnosis not present

## 2016-05-31 DIAGNOSIS — Z510 Encounter for antineoplastic radiation therapy: Secondary | ICD-10-CM | POA: Diagnosis not present

## 2016-05-31 DIAGNOSIS — Z170 Estrogen receptor positive status [ER+]: Secondary | ICD-10-CM | POA: Diagnosis not present

## 2016-05-31 NOTE — Progress Notes (Addendum)
   Weekly Management Note:  outpatient    ICD-9-CM ICD-10-CM   1. Breast cancer of upper-outer quadrant of right female breast ([place]) 174.4 C50.411     Current Dose:  28.8 Gy  Projected Dose: 60.4 Gy   Narrative:  The patient presents for routine under treatment assessment.  CBCT/MVCT images/Port film x-rays were reviewed.  The chart was checked. More   soreness on right lumpectomy site  Physical Findings:  height is 5\' 4"  (1.626 m) and weight is 291 lb 8 oz (132.224 kg). Her temperature is 98.1 F (36.7 C). Her blood pressure is 136/86 and her pulse is 93.   Wt Readings from Last 3 Encounters:  06/03/16 291 lb 8 oz (132.224 kg)  05/27/16 287 lb 12.8 oz (130.545 kg)  05/20/16 292 lb 1.6 oz (132.496 kg)   Well appearing, right breast moderately  hyperpigmented, skin intact.   Impression:  The patient is tolerating radiotherapy.  Plan:  Continue radiotherapy as planned. Patient instructed to apply Radiplex to intact skin in treatment fields. Hydrogel pads provided for soreness over lumpectomy site  ________________________________   Eppie Gibson, M.D.

## 2016-06-03 ENCOUNTER — Ambulatory Visit
Admission: RE | Admit: 2016-06-03 | Discharge: 2016-06-03 | Disposition: A | Discharge status: Home / Self Care | Payer: BLUE CROSS/BLUE SHIELD | Source: Ambulatory Visit | Attending: Radiation Oncology | Admitting: Radiation Oncology

## 2016-06-03 ENCOUNTER — Encounter: Payer: Self-pay | Admitting: Radiation Oncology

## 2016-06-03 VITALS — BP 136/86 | HR 93 | Temp 98.1°F | Ht 64.0 in | Wt 291.5 lb

## 2016-06-03 DIAGNOSIS — Z90710 Acquired absence of both cervix and uterus: Secondary | ICD-10-CM | POA: Diagnosis not present

## 2016-06-03 DIAGNOSIS — Z170 Estrogen receptor positive status [ER+]: Secondary | ICD-10-CM | POA: Diagnosis not present

## 2016-06-03 DIAGNOSIS — C50411 Malignant neoplasm of upper-outer quadrant of right female breast: Secondary | ICD-10-CM

## 2016-06-03 DIAGNOSIS — Z510 Encounter for antineoplastic radiation therapy: Secondary | ICD-10-CM | POA: Diagnosis not present

## 2016-06-03 NOTE — Progress Notes (Addendum)
Vanessa [name] is here for her 16th fraction of radiation to her Right Breast. She denies pain at this time, she does have some tenderness to her incision site which started yesterday. She does have "shooting pains at times" from her lateral Right Breast to her nipple. She will take tramadol 50mg  at night for this pain with relief. She has some fatigue, and will go to bed earlier at night, because she is still working. Her Right Breast is red, hyperpigmented, swollen and tender especially at her incision site. She is using the Radiaplex cream three times daily.  BP 136/86 mmHg  Pulse 93  Temp(Src) 98.1 F (36.7 C)  Ht 5\' 4"  (1.626 m)  Wt 291 lb 8 oz (132.224 kg)  BMI 50.01 kg/m2   Wt Readings from Last 3 Encounters:  06/03/16 291 lb 8 oz (132.224 kg)  05/27/16 287 lb 12.8 oz (130.545 kg)  05/20/16 292 lb 1.6 oz (132.496 kg)

## 2016-06-04 ENCOUNTER — Ambulatory Visit
Admission: RE | Admit: 2016-06-04 | Discharge: 2016-06-04 | Disposition: A | Discharge status: Home / Self Care | Payer: BLUE CROSS/BLUE SHIELD | Source: Ambulatory Visit | Attending: Radiation Oncology | Admitting: Radiation Oncology

## 2016-06-04 DIAGNOSIS — Z170 Estrogen receptor positive status [ER+]: Secondary | ICD-10-CM | POA: Diagnosis not present

## 2016-06-04 DIAGNOSIS — Z510 Encounter for antineoplastic radiation therapy: Secondary | ICD-10-CM | POA: Diagnosis not present

## 2016-06-04 DIAGNOSIS — C50411 Malignant neoplasm of upper-outer quadrant of right female breast: Secondary | ICD-10-CM | POA: Diagnosis not present

## 2016-06-04 DIAGNOSIS — Z90710 Acquired absence of both cervix and uterus: Secondary | ICD-10-CM | POA: Diagnosis not present

## 2016-06-05 ENCOUNTER — Ambulatory Visit
Admission: RE | Admit: 2016-06-05 | Discharge: 2016-06-05 | Disposition: A | Discharge status: Home / Self Care | Payer: BLUE CROSS/BLUE SHIELD | Source: Ambulatory Visit | Attending: Radiation Oncology | Admitting: Radiation Oncology

## 2016-06-05 DIAGNOSIS — Z170 Estrogen receptor positive status [ER+]: Secondary | ICD-10-CM | POA: Diagnosis not present

## 2016-06-05 DIAGNOSIS — Z90710 Acquired absence of both cervix and uterus: Secondary | ICD-10-CM | POA: Diagnosis not present

## 2016-06-05 DIAGNOSIS — C50411 Malignant neoplasm of upper-outer quadrant of right female breast: Secondary | ICD-10-CM | POA: Diagnosis not present

## 2016-06-05 DIAGNOSIS — Z510 Encounter for antineoplastic radiation therapy: Secondary | ICD-10-CM | POA: Diagnosis not present

## 2016-06-05 NOTE — Progress Notes (Signed)
   Weekly Management Note:  outpatient    ICD-9-CM ICD-10-CM   1. Breast cancer of upper-outer quadrant of right female breast (HCC) 174.4 C50.411     Current Dose:  36 Gy  Projected Dose: 60.4 Gy   Narrative:  The patient presents for routine under treatment assessment.  CBCT/MVCT images/Port film x-rays were reviewed.  The chart was checked. Ms. Delbuono presents for her 20th fraction of radiation to her Right Breast. She reports pain a 6/10 when moving her Right arm which started yesterday. She took some Tylenol and Tramadol which helped relieve the pain. She reports fatigue, but is still working full time. She is going to bed earlier at night most evenings. She is using the Radiaplex twice daily.  Physical Findings:  height is 5\' 4"  (1.626 m) and weight is 292 lb 4.8 oz (132.586 kg). Her temperature is 97.7 F (36.5 C). Her blood pressure is 143/82 and her pulse is 81.   Wt Readings from Last 3 Encounters:  06/07/16 292 lb 4.8 oz (132.586 kg)  06/03/16 291 lb 8 oz (132.224 kg)  05/27/16 287 lb 12.8 oz (130.545 kg)   Right breast with erythema but the skin is intact. Tiny area of desquamation at the inframammary fold.   Impression:  The patient is tolerating radiotherapy.   Plan:  Continue radiotherapy as planned. Patient instructed to apply Radiplex to intact skin in treatment fields. Neosporin if peeling worsens at IM fold. She wants to continue working and feels she has enough meds for pain. ________________________________   Eppie Gibson, M.D.  This document serves as a record of services personally performed by Eppie Gibson, MD. It was created on her behalf by Derek Mound, a trained medical scribe. The creation of this record is based on the scribe's personal observations and the provider's statements to them. This document has been checked and approved by the attending provider.

## 2016-06-06 ENCOUNTER — Ambulatory Visit
Admission: RE | Admit: 2016-06-06 | Discharge: 2016-06-06 | Disposition: A | Discharge status: Home / Self Care | Payer: BLUE CROSS/BLUE SHIELD | Source: Ambulatory Visit | Attending: Radiation Oncology | Admitting: Radiation Oncology

## 2016-06-06 DIAGNOSIS — Z170 Estrogen receptor positive status [ER+]: Secondary | ICD-10-CM | POA: Diagnosis not present

## 2016-06-06 DIAGNOSIS — Z90710 Acquired absence of both cervix and uterus: Secondary | ICD-10-CM | POA: Diagnosis not present

## 2016-06-06 DIAGNOSIS — C50411 Malignant neoplasm of upper-outer quadrant of right female breast: Secondary | ICD-10-CM | POA: Diagnosis not present

## 2016-06-06 DIAGNOSIS — Z510 Encounter for antineoplastic radiation therapy: Secondary | ICD-10-CM | POA: Diagnosis not present

## 2016-06-07 ENCOUNTER — Ambulatory Visit
Admission: RE | Admit: 2016-06-07 | Discharge: 2016-06-07 | Disposition: A | Discharge status: Home / Self Care | Payer: BLUE CROSS/BLUE SHIELD | Source: Ambulatory Visit | Attending: Radiation Oncology | Admitting: Radiation Oncology

## 2016-06-07 ENCOUNTER — Encounter: Payer: Self-pay | Admitting: Radiation Oncology

## 2016-06-07 VITALS — BP 143/82 | HR 81 | Temp 97.7°F | Ht 64.0 in | Wt 292.3 lb

## 2016-06-07 DIAGNOSIS — C50411 Malignant neoplasm of upper-outer quadrant of right female breast: Secondary | ICD-10-CM | POA: Diagnosis not present

## 2016-06-07 DIAGNOSIS — Z170 Estrogen receptor positive status [ER+]: Secondary | ICD-10-CM | POA: Diagnosis not present

## 2016-06-07 DIAGNOSIS — Z90710 Acquired absence of both cervix and uterus: Secondary | ICD-10-CM | POA: Diagnosis not present

## 2016-06-07 DIAGNOSIS — Z510 Encounter for antineoplastic radiation therapy: Secondary | ICD-10-CM | POA: Diagnosis not present

## 2016-06-07 DIAGNOSIS — C50911 Malignant neoplasm of unspecified site of right female breast: Secondary | ICD-10-CM | POA: Diagnosis not present

## 2016-06-07 NOTE — Progress Notes (Signed)
Vanessa [name] presents for her 20th fraction of radiation to her Right Breast. She reports pain a 6/10 when moving her Right arm which started yesterday. She took some Tylenol and Tramadol which helped relieve the pain. She reports fatigue, but is still working full time. She is going to bed earlier at night most evenings. Her Right Breast is red, hyperpigmented and slightly swollen. She is using the Radiaplex twice daily.  BP 143/82 mmHg  Pulse 81  Temp(Src) 97.7 F (36.5 C)  Ht 5\' 4"  (1.626 m)  Wt 292 lb 4.8 oz (132.586 kg)  BMI 50.15 kg/m2   Wt Readings from Last 3 Encounters:  06/07/16 292 lb 4.8 oz (132.586 kg)  06/03/16 291 lb 8 oz (132.224 kg)  05/27/16 287 lb 12.8 oz (130.545 kg)

## 2016-06-10 ENCOUNTER — Ambulatory Visit
Admission: RE | Admit: 2016-06-10 | Discharge: 2016-06-10 | Disposition: A | Discharge status: Home / Self Care | Payer: BLUE CROSS/BLUE SHIELD | Source: Ambulatory Visit | Attending: Radiation Oncology | Admitting: Radiation Oncology

## 2016-06-10 DIAGNOSIS — Z90710 Acquired absence of both cervix and uterus: Secondary | ICD-10-CM | POA: Diagnosis not present

## 2016-06-10 DIAGNOSIS — C50411 Malignant neoplasm of upper-outer quadrant of right female breast: Secondary | ICD-10-CM | POA: Diagnosis not present

## 2016-06-10 DIAGNOSIS — Z510 Encounter for antineoplastic radiation therapy: Secondary | ICD-10-CM | POA: Diagnosis not present

## 2016-06-10 DIAGNOSIS — Z170 Estrogen receptor positive status [ER+]: Secondary | ICD-10-CM | POA: Diagnosis not present

## 2016-06-12 ENCOUNTER — Ambulatory Visit
Admission: RE | Admit: 2016-06-12 | Discharge: 2016-06-12 | Disposition: A | Discharge status: Home / Self Care | Payer: BLUE CROSS/BLUE SHIELD | Source: Ambulatory Visit | Attending: Radiation Oncology | Admitting: Radiation Oncology

## 2016-06-12 DIAGNOSIS — C50411 Malignant neoplasm of upper-outer quadrant of right female breast: Secondary | ICD-10-CM | POA: Diagnosis not present

## 2016-06-12 DIAGNOSIS — Z510 Encounter for antineoplastic radiation therapy: Secondary | ICD-10-CM | POA: Diagnosis not present

## 2016-06-12 DIAGNOSIS — Z170 Estrogen receptor positive status [ER+]: Secondary | ICD-10-CM | POA: Diagnosis not present

## 2016-06-12 DIAGNOSIS — Z90710 Acquired absence of both cervix and uterus: Secondary | ICD-10-CM | POA: Diagnosis not present

## 2016-06-13 ENCOUNTER — Ambulatory Visit
Admission: RE | Admit: 2016-06-13 | Discharge: 2016-06-13 | Disposition: A | Discharge status: Home / Self Care | Payer: BLUE CROSS/BLUE SHIELD | Source: Ambulatory Visit | Attending: Radiation Oncology | Admitting: Radiation Oncology

## 2016-06-13 DIAGNOSIS — Z170 Estrogen receptor positive status [ER+]: Secondary | ICD-10-CM | POA: Diagnosis not present

## 2016-06-13 DIAGNOSIS — Z510 Encounter for antineoplastic radiation therapy: Secondary | ICD-10-CM | POA: Diagnosis not present

## 2016-06-13 DIAGNOSIS — C50411 Malignant neoplasm of upper-outer quadrant of right female breast: Secondary | ICD-10-CM | POA: Diagnosis not present

## 2016-06-13 DIAGNOSIS — Z90710 Acquired absence of both cervix and uterus: Secondary | ICD-10-CM | POA: Diagnosis not present

## 2016-06-14 ENCOUNTER — Encounter: Payer: Self-pay | Admitting: Radiation Oncology

## 2016-06-14 ENCOUNTER — Ambulatory Visit
Admission: RE | Admit: 2016-06-14 | Discharge: 2016-06-14 | Disposition: A | Discharge status: Home / Self Care | Payer: BLUE CROSS/BLUE SHIELD | Source: Ambulatory Visit | Attending: Radiation Oncology | Admitting: Radiation Oncology

## 2016-06-14 VITALS — BP 147/93 | HR 94 | Temp 97.9°F | Ht 64.0 in | Wt 289.7 lb

## 2016-06-14 DIAGNOSIS — Z170 Estrogen receptor positive status [ER+]: Secondary | ICD-10-CM | POA: Diagnosis not present

## 2016-06-14 DIAGNOSIS — C50411 Malignant neoplasm of upper-outer quadrant of right female breast: Secondary | ICD-10-CM | POA: Diagnosis not present

## 2016-06-14 DIAGNOSIS — Z90710 Acquired absence of both cervix and uterus: Secondary | ICD-10-CM | POA: Diagnosis not present

## 2016-06-14 DIAGNOSIS — Z510 Encounter for antineoplastic radiation therapy: Secondary | ICD-10-CM | POA: Diagnosis not present

## 2016-06-14 NOTE — Progress Notes (Signed)
Vanessa [name] has completed 24 fractions to her right breast.  She reports having shooting pain at a 5/10 in her right breast that comes and goes.  She said at night the pain is constant.  She reports taking tramadol 2 nights ago and said she woke up at 4 in the morning itching and had a rash.  She said she took 2 benadryl and it went away.  She reports having fatigue.  She is using radiaplex gel 3 times a day.  The skin on her right breast is red with hyperpigmentation.  She also mentioned she is applying neosporin under her right breast.  BP 147/93 mmHg  Pulse 94  Temp(Src) 97.9 F (36.6 C) (Oral)  Ht 5\' 4"  (1.626 m)  Wt 289 lb 11.2 oz (131.407 kg)  BMI 49.70 kg/m2  SpO2 100%   Wt Readings from Last 3 Encounters:  06/14/16 289 lb 11.2 oz (131.407 kg)  06/07/16 292 lb 4.8 oz (132.586 kg)  06/03/16 291 lb 8 oz (132.224 kg)

## 2016-06-14 NOTE — Progress Notes (Addendum)
     Weekly Management Note:  outpatient    ICD-9-CM ICD-10-CM   1. Breast cancer of upper-outer quadrant of right female breast ([place]) 174.4 C50.411    Current Dose:  43.2 Gy  Projected Dose: 60.4 Gy   Narrative: The patient presents for routine under treatment assessment.  CBCT/MVCT images/Port film x-rays were reviewed. Vanessa [name] has completed 24 fractions to her right breast. She reports having shooting pain at a 5/10 in her right breast that comes and goes. She said at night the pain is constant. She reports taking tramadol 2 nights ago and said she woke up at 4 in the morning itching and had a diffuse torso rash. She said she took 2 benadryl and it went away. She reports having fatigue. She is using radiaplex gel 3 times a day. The skin on her right breast is red with hyperpigmentation. She also mentioned she is applying neosporin under her right breast.  Physical Findings:  height is 5\' 4"  (1.626 m) and weight is 289 lb 11.2 oz (131.407 kg). Her oral temperature is 97.9 F (36.6 C). Her blood pressure is 147/93 and her pulse is 94. Her oxygen saturation is 100%.   Wt Readings from Last 3 Encounters:  06/14/16 289 lb 11.2 oz (131.407 kg)  06/07/16 292 lb 4.8 oz (132.586 kg)  06/03/16 291 lb 8 oz (132.224 kg)   Skin over right breast is hyperpigmented with a little bit of dry desquamation over right areola.   Impression:  The patient is tolerating radiotherapy.   Plan:  Continue radiotherapy as planned. Advised to call 911 if she develops any sign of tongue or throat swelling.  Unclear what caused her rash. She will take Tramadol with caution. ________________________________   Eppie Gibson, M.D.    This document serves as a record of services personally performed by Eppie Gibson , MD. It was created on his behalf by Truddie Hidden, a trained medical scribe. The creation of this record is based on the scribe's personal observations and the provider's statements to them. This  document has been checked and approved by the attending provider.

## 2016-06-17 ENCOUNTER — Ambulatory Visit
Admission: RE | Admit: 2016-06-17 | Discharge: 2016-06-17 | Disposition: A | Discharge status: Home / Self Care | Payer: BLUE CROSS/BLUE SHIELD | Source: Ambulatory Visit | Attending: Radiation Oncology | Admitting: Radiation Oncology

## 2016-06-17 DIAGNOSIS — Z510 Encounter for antineoplastic radiation therapy: Secondary | ICD-10-CM | POA: Diagnosis not present

## 2016-06-17 DIAGNOSIS — C50411 Malignant neoplasm of upper-outer quadrant of right female breast: Secondary | ICD-10-CM | POA: Diagnosis not present

## 2016-06-17 DIAGNOSIS — Z90710 Acquired absence of both cervix and uterus: Secondary | ICD-10-CM | POA: Diagnosis not present

## 2016-06-17 DIAGNOSIS — Z170 Estrogen receptor positive status [ER+]: Secondary | ICD-10-CM | POA: Diagnosis not present

## 2016-06-18 ENCOUNTER — Ambulatory Visit
Admission: RE | Admit: 2016-06-18 | Discharge: 2016-06-18 | Disposition: A | Discharge status: Home / Self Care | Payer: BLUE CROSS/BLUE SHIELD | Source: Ambulatory Visit | Attending: Radiation Oncology | Admitting: Radiation Oncology

## 2016-06-18 ENCOUNTER — Encounter: Payer: Self-pay | Admitting: Radiation Oncology

## 2016-06-18 VITALS — BP 137/85 | HR 81 | Temp 98.2°F | Ht 64.0 in | Wt 291.0 lb

## 2016-06-18 DIAGNOSIS — C50411 Malignant neoplasm of upper-outer quadrant of right female breast: Secondary | ICD-10-CM | POA: Diagnosis not present

## 2016-06-18 DIAGNOSIS — Z510 Encounter for antineoplastic radiation therapy: Secondary | ICD-10-CM | POA: Diagnosis not present

## 2016-06-18 DIAGNOSIS — Z90710 Acquired absence of both cervix and uterus: Secondary | ICD-10-CM | POA: Diagnosis not present

## 2016-06-18 DIAGNOSIS — Z170 Estrogen receptor positive status [ER+]: Secondary | ICD-10-CM | POA: Diagnosis not present

## 2016-06-18 NOTE — Progress Notes (Signed)
     Weekly Management Note:  outpatient    ICD-9-CM ICD-10-CM   1. Breast cancer of upper-outer quadrant of right female breast ([place]) 174.4 C50.411    Current Dose:  46.8 Gy  Projected Dose: 60.4 Gy   Narrative: The patient presents for routine under treatment assessment.  CBCT/MVCT images/Port film x-rays were reviewed. Has nausea yesterday, improving today. No HA today. No fever.  Physical Findings:  height is 5\' 4"  (1.626 m) and weight is 291 lb (131.997 kg). Her temperature is 98.2 F (36.8 C). Her blood pressure is 137/85 and her pulse is 81. Her oxygen saturation is 99%.   Wt Readings from Last 3 Encounters:  06/18/16 291 lb (131.997 kg)  06/14/16 289 lb 11.2 oz (131.407 kg)  06/07/16 292 lb 4.8 oz (132.586 kg)   Skin over right breast is hyperpigmented with a little bit of dry desquamation over right areola and thinning of skin at IM fold.  No moist desquamation.   Impression:  The patient is tolerating radiotherapy.   Plan:  Continue radiotherapy as planned.   Zofran prn nausea. ________________________________   Eppie Gibson, M.D.

## 2016-06-18 NOTE — Progress Notes (Addendum)
Vanessa [name] is here for her 26th fraction of radiation to her Right Breast. She reports some soreness over her Right Breast which increased slightly yesterday. She has not tried to take tramadol for pain at this time, but states she may tonight.Her Right Breast is red, hyperpigmented and tender to touch. She has an open area underneath her Right Breast which she has been using neosporin on. She is using radiaplex twice daily to other areas of her Breast. She reports not feeling well yesterday, and had some nausea. She took a zofran and felt somewhat better. She feels a little better today. She has some fatigue, and is working full time, which is causing her to become tired as the day goes on.   BP 137/85 mmHg  Pulse 81  Temp(Src) 98.2 F (36.8 C)  Ht 5\' 4"  (1.626 m)  Wt 291 lb (131.997 kg)  BMI 49.93 kg/m2  SpO2 99%   Wt Readings from Last 3 Encounters:  06/18/16 291 lb (131.997 kg)  06/14/16 289 lb 11.2 oz (131.407 kg)  06/07/16 292 lb 4.8 oz (132.586 kg)

## 2016-06-19 ENCOUNTER — Ambulatory Visit
Admission: RE | Admit: 2016-06-19 | Discharge: 2016-06-19 | Disposition: A | Discharge status: Home / Self Care | Payer: BLUE CROSS/BLUE SHIELD | Source: Ambulatory Visit | Attending: Radiation Oncology | Admitting: Radiation Oncology

## 2016-06-19 ENCOUNTER — Encounter: Payer: Self-pay | Admitting: Radiation Oncology

## 2016-06-19 DIAGNOSIS — Z90710 Acquired absence of both cervix and uterus: Secondary | ICD-10-CM | POA: Diagnosis not present

## 2016-06-19 DIAGNOSIS — C50411 Malignant neoplasm of upper-outer quadrant of right female breast: Secondary | ICD-10-CM | POA: Diagnosis not present

## 2016-06-19 DIAGNOSIS — Z170 Estrogen receptor positive status [ER+]: Secondary | ICD-10-CM | POA: Diagnosis not present

## 2016-06-19 DIAGNOSIS — Z510 Encounter for antineoplastic radiation therapy: Secondary | ICD-10-CM | POA: Diagnosis not present

## 2016-06-20 ENCOUNTER — Ambulatory Visit (HOSPITAL_BASED_OUTPATIENT_CLINIC_OR_DEPARTMENT_OTHER): Payer: BLUE CROSS/BLUE SHIELD | Admitting: Oncology

## 2016-06-20 ENCOUNTER — Encounter: Payer: Self-pay | Admitting: *Deleted

## 2016-06-20 ENCOUNTER — Ambulatory Visit
Admission: RE | Admit: 2016-06-20 | Discharge: 2016-06-20 | Disposition: A | Discharge status: Home / Self Care | Payer: BLUE CROSS/BLUE SHIELD | Source: Ambulatory Visit | Attending: Radiation Oncology | Admitting: Radiation Oncology

## 2016-06-20 ENCOUNTER — Other Ambulatory Visit (HOSPITAL_BASED_OUTPATIENT_CLINIC_OR_DEPARTMENT_OTHER): Payer: BLUE CROSS/BLUE SHIELD

## 2016-06-20 ENCOUNTER — Ambulatory Visit (HOSPITAL_BASED_OUTPATIENT_CLINIC_OR_DEPARTMENT_OTHER): Payer: BLUE CROSS/BLUE SHIELD

## 2016-06-20 ENCOUNTER — Ambulatory Visit: Payer: Self-pay

## 2016-06-20 VITALS — BP 168/83 | HR 92 | Temp 98.3°F | Resp 20 | Ht 64.0 in | Wt 288.7 lb

## 2016-06-20 DIAGNOSIS — G620 Drug-induced polyneuropathy: Secondary | ICD-10-CM

## 2016-06-20 DIAGNOSIS — Z510 Encounter for antineoplastic radiation therapy: Secondary | ICD-10-CM | POA: Diagnosis not present

## 2016-06-20 DIAGNOSIS — Z5112 Encounter for antineoplastic immunotherapy: Secondary | ICD-10-CM | POA: Diagnosis not present

## 2016-06-20 DIAGNOSIS — C50411 Malignant neoplasm of upper-outer quadrant of right female breast: Secondary | ICD-10-CM

## 2016-06-20 DIAGNOSIS — Z170 Estrogen receptor positive status [ER+]: Secondary | ICD-10-CM | POA: Diagnosis not present

## 2016-06-20 DIAGNOSIS — Z95828 Presence of other vascular implants and grafts: Secondary | ICD-10-CM

## 2016-06-20 DIAGNOSIS — Z90710 Acquired absence of both cervix and uterus: Secondary | ICD-10-CM | POA: Diagnosis not present

## 2016-06-20 LAB — COMPREHENSIVE METABOLIC PANEL
ALK PHOS: 106 U/L (ref 40–150)
ALT: 12 U/L (ref 0–55)
ANION GAP: 8 meq/L (ref 3–11)
AST: 19 U/L (ref 5–34)
Albumin: 3.4 g/dL — ABNORMAL LOW (ref 3.5–5.0)
BUN: 11 mg/dL (ref 7.0–26.0)
CALCIUM: 9.1 mg/dL (ref 8.4–10.4)
CHLORIDE: 108 meq/L (ref 98–109)
CO2: 27 mEq/L (ref 22–29)
Creatinine: 0.7 mg/dL (ref 0.6–1.1)
Glucose: 105 mg/dl (ref 70–140)
POTASSIUM: 3.7 meq/L (ref 3.5–5.1)
Sodium: 143 mEq/L (ref 136–145)
Total Bilirubin: 0.35 mg/dL (ref 0.20–1.20)
Total Protein: 7.4 g/dL (ref 6.4–8.3)

## 2016-06-20 LAB — CBC WITH DIFFERENTIAL/PLATELET
BASO%: 0.8 % (ref 0.0–2.0)
BASOS ABS: 0 10*3/uL (ref 0.0–0.1)
EOS ABS: 0.2 10*3/uL (ref 0.0–0.5)
EOS%: 4.7 % (ref 0.0–7.0)
HEMATOCRIT: 38.4 % (ref 34.8–46.6)
HGB: 12.3 g/dL (ref 11.6–15.9)
LYMPH#: 1.2 10*3/uL (ref 0.9–3.3)
LYMPH%: 32.1 % (ref 14.0–49.7)
MCH: 27 pg (ref 25.1–34.0)
MCHC: 32 g/dL (ref 31.5–36.0)
MCV: 84.4 fL (ref 79.5–101.0)
MONO#: 0.3 10*3/uL (ref 0.1–0.9)
MONO%: 8.4 % (ref 0.0–14.0)
NEUT#: 2 10*3/uL (ref 1.5–6.5)
NEUT%: 54 % (ref 38.4–76.8)
PLATELETS: 233 10*3/uL (ref 145–400)
RBC: 4.55 10*6/uL (ref 3.70–5.45)
RDW: 14.8 % — ABNORMAL HIGH (ref 11.2–14.5)
WBC: 3.6 10*3/uL — ABNORMAL LOW (ref 3.9–10.3)

## 2016-06-20 MED ORDER — SODIUM CHLORIDE 0.9% FLUSH
10.0000 mL | INTRAVENOUS | Status: DC | PRN
  Administered 2016-06-20: 10 mL
  Filled 2016-06-20: qty 10

## 2016-06-20 MED ORDER — ACETAMINOPHEN 325 MG PO TABS
650.0000 mg | ORAL_TABLET | Freq: Once | ORAL | Status: AC
  Administered 2016-06-20: 650 mg via ORAL

## 2016-06-20 MED ORDER — SODIUM CHLORIDE 0.9 % IJ SOLN
10.0000 mL | INTRAMUSCULAR | Status: DC | PRN
  Administered 2016-06-20: 10 mL via INTRAVENOUS
  Filled 2016-06-20: qty 10

## 2016-06-20 MED ORDER — ACETAMINOPHEN 325 MG PO TABS
ORAL_TABLET | ORAL | Status: AC
  Filled 2016-06-20: qty 23

## 2016-06-20 MED ORDER — TRASTUZUMAB CHEMO 150 MG IV SOLR
6.0000 mg/kg | Freq: Once | INTRAVENOUS | Status: AC
  Administered 2016-06-20: 840 mg via INTRAVENOUS
  Filled 2016-06-20: qty 40

## 2016-06-20 MED ORDER — HEPARIN SOD (PORK) LOCK FLUSH 100 UNIT/ML IV SOLN
500.0000 [IU] | Freq: Once | INTRAVENOUS | Status: AC | PRN
  Administered 2016-06-20: 500 [IU]
  Filled 2016-06-20: qty 5

## 2016-06-20 MED ORDER — DIPHENHYDRAMINE HCL 25 MG PO CAPS
50.0000 mg | ORAL_CAPSULE | Freq: Once | ORAL | Status: AC
  Administered 2016-06-20: 50 mg via ORAL

## 2016-06-20 MED ORDER — SODIUM CHLORIDE 0.9 % IV SOLN
Freq: Once | INTRAVENOUS | Status: AC
  Administered 2016-06-20: 13:00:00 via INTRAVENOUS

## 2016-06-20 MED ORDER — DIPHENHYDRAMINE HCL 25 MG PO CAPS
ORAL_CAPSULE | ORAL | Status: AC
  Filled 2016-06-20: qty 2

## 2016-06-20 NOTE — Patient Instructions (Signed)
[facility] Discharge Instructions for Patients Receiving Chemotherapy  Today you received the following chemotherapy agents Herceptin  To help prevent nausea and vomiting after your treatment, we encourage you to take your nausea medication    If you develop nausea and vomiting that is not controlled by your nausea medication, call the clinic.   BELOW ARE SYMPTOMS THAT SHOULD BE REPORTED IMMEDIATELY:  *FEVER GREATER THAN 100.5 F  *CHILLS WITH OR WITHOUT FEVER  NAUSEA AND VOMITING THAT IS NOT CONTROLLED WITH YOUR NAUSEA MEDICATION  *UNUSUAL SHORTNESS OF BREATH  *UNUSUAL BRUISING OR BLEEDING  TENDERNESS IN MOUTH AND THROAT WITH OR WITHOUT PRESENCE OF ULCERS  *URINARY PROBLEMS  *BOWEL PROBLEMS  UNUSUAL RASH Items with * indicate a potential emergency and should be followed up as soon as possible.  Feel free to call the clinic you have any questions or concerns. The clinic phone number is [phone].  Please show the CHEMO ALERT CARD at check-in to the Emergency Department and triage nurse.   

## 2016-06-20 NOTE — Patient Instructions (Signed)

## 2016-06-20 NOTE — Progress Notes (Signed)
[place]  Telephone:(336) [phone] Fax:(336) [phone]     ID: Vanessa [name] DOB: 1961-09-18  MR#: [id number]  RXV#:[id number]  Patient Care Team: Vanessa Nip, MD as PCP - General (Family Medicine) Vanessa Luna, MD as Consulting Physician (General Surgery) Vanessa Cruel, MD as Consulting Physician (Oncology) Vanessa Pacas, MD as Consulting Physician (Neurology) Vanessa [name], DPM as Consulting Physician (Podiatry) Vanessa Salina, MD as Consulting Physician (Obstetrics and Gynecology) Vanessa Gibson, MD as Attending Physician (Radiation Oncology) Vanessa Dresser, MD as Consulting Physician (Cardiology) PCP: Vanessa Nip, MD OTHER MD:  CHIEF COMPLAINT: Triple positive breast cancer  CURRENT TREATMENT: trastuzumab  BREAST CANCER HISTORY: From the original intake note:  Vanessa [name] had screening mammography at Dr. cousins office on 11/06/2015 suggesting a change in the right breast. She was scheduled for right diagnostic mammography and tomosynthesis with ultrasonography at the [facility] 11/13/2015. The breast density was category B. In the upper outer quadrant of the right breast there was a small irregular mass with suspicious internal calcifications. By exam this was small, mobile, firm, and located at the 10:00 position 7 cm from the nipple. Ultrasound of the right breast confirmed an irregularly marginated hypoechoic mass measuring 1.1 cm. The right axilla was sonographically benign.  Biopsy of the right breast mass in question 11/15/2015 showed ([id number]) an invasive ductal carcinoma, grade 2, estrogen receptor 100% positive, progesterone receptor 2% positive, both with strong staining intensity, with an MIB-1 of 70%, and HER-2 amplification, the signals ratio being 4.92 and the number per cell 17.45.  The patient's subsequent history is as detailed below.  INTERVAL HISTORY: Vanessa [name] returns today for follow-up of her HER-2 positive breast  cancer. She completed 11 of 12 planned cycles of Abraxane. The last cycle had to be omitted because of concerns regarding peripheral neuropathy. She is here today for trastuzumab.  She is currently undergoing radiation, and has approximately a week and a half to go. She tells me the skin is very painful of present and she is very fatigued, but she continues to work full-time.   REVIEW OF SYSTEMS: Vanessa [name] continues to have numbness in her fingers. This can affect are writing, her holding onto things which sometimes drops, and her ability to feel things clearly. This is a problem because she does a lot of paperwork and typing. She is trying to accommodate. At night and it isn't as numbness but also pain in her fingers and gabapentin is helpful and that setting. Aside from these issues a detailed review of systems today was negative except as noted above  PAST MEDICAL HISTORY: Past Medical History  Diagnosis Date  . Allergy   . Arthritis   . Anemia   . Trigeminal neuralgia 05/2014  . Family history of breast cancer   . Family history of colon cancer   . Family history of thyroid cancer   . Hypertension     no meds now  . Anxiety   . PONV (postoperative nausea and vomiting)     PAST SURGICAL HISTORY: Past Surgical History  Procedure Laterality Date  . Breast surgery      biopsy  . Abdominal hysterectomy    . Meniscus repair      L knee  . Eye surgery    . Breast lumpectomy with radioactive seed and sentinel lymph node biopsy Right 12/29/2015    Procedure: RIGHT BREAST  RADIOACTIVE SEED LOCALIZATION LUMPECTOMY AND RIGHT SENTINEL LYMPH NODE MAPPING;  Surgeon: Vanessa Luna, MD;  Location: [facility];  Service: General;  Laterality: Right;  . Portacath placement N/A 12/29/2015    Procedure: INSERTION PORT-A-CATH;  Surgeon: Vanessa Luna, MD;  Location: [facility];  Service: General;  Laterality: N/A;    FAMILY HISTORY Family History  Problem Relation Age  of Onset  . Diabetes Mother   . Hypertension Mother   . Asthma Daughter   . Diabetes Sister   . Hypertension Sister   . Colon cancer Sister 103  . Thyroid cancer Sister 24  . Lung cancer Father   . Breast cancer Cousin     maternal first cousin  . Breast cancer Cousin 33    maternal first cousin - inflammatory   the patient's father died from lung cancer at the age of 54, in the setting of tobacco abuse. The patient's mother is living, at age 28. The patient had 2 brothers, 3 sisters. One sister was diagnosed with colon cancer in her 67s and later thyroid cancer. She died from metastatic disease. One brother died with pulmonary problems. On the maternal side a cousin was diagnosed with inflammatory breast cancer at the age of 37. A second cousin on the maternal side was diagnosed with breast cancer in her early 65s. There is no history of ovarian cancer in the family.  GYNECOLOGIC HISTORY:  No LMP recorded. Patient is postmenopausal. Menarche age 67, first live birth age 34, the patient is [place] P2. She underwent simple hysterectomy with bilateral salpingectomy 06/28/2010, with benign pathology([id number]). She still has her ovaries in place. 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 She describes herself is single. At home she lives with her daughter Vanessa [name], who teaches theater at [employer] middle school and is currently studying towards a Masters in counseling; and her son Vanessa [name], who is a professional basketball player in the international circuit (currently with the [employer]). The patient has no grandchildren. She attends a Tour manager    ADVANCED DIRECTIVES: Not in place. At the initial clinic visit 06/20/2016 the patient was given the appropriate forms to complete and notarize at her discretion.  HEALTH MAINTENANCE: Social History  Substance Use  Topics  . Smoking status: Never Smoker   . Smokeless tobacco: Never Used  . Alcohol Use: Yes     Comment: social     Colonoscopy: 2016/[facility]  PAP:  Bone density:  Lipid panel:  Allergies  Allergen Reactions  . Penicillins Anaphylaxis  . Cyclobenzaprine Swelling  . Hydrocodone Itching  . Meperidine Swelling  . Amoxicillin-Pot Clavulanate Rash  . Cefaclor Rash  . Codeine Rash  . Darvocet [Propoxyphene N-Acetaminophen] Rash  . Demerol Rash  . Erythromycin Rash  . Flexeril [Cyclobenzaprine Hcl] Rash  . Percocet [Oxycodone-Acetaminophen] Itching and Rash    Red streaks come up on skin  . Sulfa Antibiotics Rash    Current Outpatient Prescriptions  Medication Sig Dispense Refill  . acetaminophen (TYLENOL) 325 MG tablet Take 650 mg by mouth every 6 (six) hours as needed. Reported on 04/18/2016    . b complex vitamins tablet Take 1 tablet by mouth daily. Reported on 06/14/2016    . cetirizine (ZYRTEC) 10 MG tablet Take 10 mg by mouth daily. Reported on 06/14/2016    . Docusate Sodium (COLACE PO) Take 1 tablet by mouth as needed. Reported on 06/14/2016    . furosemide (LASIX) 20 MG tablet Take 1 tablet (20  mg total) by mouth daily. For 7 days (Patient not taking: Reported on 05/13/2016) 7 tablet 0  . gabapentin (NEURONTIN) 300 MG capsule Take 1 capsule (300 mg total) by mouth at bedtime. 90 capsule 0  . gabapentin (NEURONTIN) 600 MG tablet TAKE 1 TABLET (600 MG TOTAL) BY MOUTH 3 (THREE) TIMES DAILY. 90 tablet 6  . KLOR-CON M10 10 MEQ tablet Take 2 tablets (20 mEq total) by mouth daily. For 7 days (Patient not taking: Reported on 05/13/2016) 14 tablet 0  . lidocaine-prilocaine (EMLA) cream Apply 1 application topically as needed. 30 g 1  . Multiple Vitamins-Minerals (CENTRUM SILVER ADULT 50+ PO) Take by mouth. Reported on 03/14/2016    . ondansetron (ZOFRAN ODT) 4 MG disintegrating tablet Take 1 tablet (4 mg total) by mouth every 8 (eight) hours as needed for nausea or vomiting. (Patient not  taking: Reported on 05/13/2016) 20 tablet 1  . senna (SENOKOT) 8.6 MG tablet Take 1 tablet by mouth as needed. Reported on 06/14/2016    . traMADol (ULTRAM) 50 MG tablet Take 1 tablet (50 mg total) by mouth 2 (two) times daily as needed. (Patient not taking: Reported on 06/14/2016) 50 tablet 0   No current facility-administered medications for this visit.    OBJECTIVE: Middle-aged African-American woman In no acute distress Filed Vitals:   06/20/16 1104  BP: 168/83  Pulse: 92  Temp: 98.3 F (36.8 C)  Resp: 20     Body mass index is 49.53 kg/(m^2).    ECOG FS:1 - Symptomatic but completely ambulatory  Sclerae unicteric, pupils round and equal Oropharynx clear and moist-- no thrush or other lesions No cervical or supraclavicular adenopathy Lungs no rales or rhonchi Heart regular rate and rhythm Abd soft, nontender, positive bowel sounds MSK no focal spinal tenderness, no upper extremity lymphedema Neuro: nonfocal, well oriented, appropriate affect Breasts: Deferred  LAB RESULTS:  CMP     Component Value Date/Time   NA 143 06/20/2016 1039   NA 141 12/20/2015 1120   NA 143 04/25/2015 1148   K 3.7 06/20/2016 1039   K 3.3* 12/20/2015 1120   CL 106 12/20/2015 1120   CO2 27 06/20/2016 1039   CO2 28 12/20/2015 1120   GLUCOSE 105 06/20/2016 1039   GLUCOSE 100* 12/20/2015 1120   GLUCOSE 89 04/25/2015 1148   BUN 11.0 06/20/2016 1039   BUN 12 12/20/2015 1120   BUN 13 04/25/2015 1148   CREATININE 0.7 06/20/2016 1039   CREATININE 0.73 12/20/2015 1120   CREATININE 0.72 05/25/2014 1112   CALCIUM 9.1 06/20/2016 1039   CALCIUM 9.1 12/20/2015 1120   PROT 7.4 06/20/2016 1039   PROT 7.4 12/20/2015 1120   ALBUMIN 3.4* 06/20/2016 1039   ALBUMIN 3.4* 12/20/2015 1120   AST 19 06/20/2016 1039   AST 19 12/20/2015 1120   ALT 12 06/20/2016 1039   ALT 14 12/20/2015 1120   ALKPHOS 106 06/20/2016 1039   ALKPHOS 105 12/20/2015 1120   BILITOT 0.35 06/20/2016 1039   BILITOT 0.3 12/20/2015 1120    GFRNONAA >60 12/20/2015 1120   GFRAA >60 12/20/2015 1120    INo results found for: SPEP, UPEP  Lab Results  Component Value Date   WBC 3.6* 06/20/2016   NEUTROABS 2.0 06/20/2016   HGB 12.3 06/20/2016   HCT 38.4 06/20/2016   MCV 84.4 06/20/2016   PLT 233 06/20/2016      Chemistry      Component Value Date/Time   NA 143 06/20/2016 1039   NA 141  12/20/2015 1120   NA 143 04/25/2015 1148   K 3.7 06/20/2016 1039   K 3.3* 12/20/2015 1120   CL 106 12/20/2015 1120   CO2 27 06/20/2016 1039   CO2 28 12/20/2015 1120   BUN 11.0 06/20/2016 1039   BUN 12 12/20/2015 1120   BUN 13 04/25/2015 1148   CREATININE 0.7 06/20/2016 1039   CREATININE 0.73 12/20/2015 1120   CREATININE 0.72 05/25/2014 1112      Component Value Date/Time   CALCIUM 9.1 06/20/2016 1039   CALCIUM 9.1 12/20/2015 1120   ALKPHOS 106 06/20/2016 1039   ALKPHOS 105 12/20/2015 1120   AST 19 06/20/2016 1039   AST 19 12/20/2015 1120   ALT 12 06/20/2016 1039   ALT 14 12/20/2015 1120   BILITOT 0.35 06/20/2016 1039   BILITOT 0.3 12/20/2015 1120      No results found for: LABCA2  No components found for: LABCA125  No results for input(s): INR in the last 168 hours.  Urinalysis No results found for: COLORURINE, APPEARANCEUR, LABSPEC, PHURINE, GLUCOSEU, HGBUR, BILIRUBINUR, KETONESUR, PROTEINUR, UROBILINOGEN, NITRITE, LEUKOCYTESUR  STUDIES: No results found.  ASSESSMENT: 55 y.o. [place] woman status post right breast upper outer quadrant biopsy 11/15/2015 for a clinical T1c N0, stage IA invasive ductal carcinoma, grade 2, estrogen and progesterone receptor positive, HER-2 amplified, with an MIB-1 of 70%  (1) Genetics testing 12/22/2015 through the Hereditary Gene Panel offered by [facility] found no deleterious mutations in  APC, ATM, AXIN2, BARD1, BMPR1A, BRCA1, BRCA2, BRIP1, CDH1, CDKN2A, CHEK2, DICER1, EPCAM, GREM1, KIT, MEN1, MLH1, MSH2, MSH6, MUTYH, NBN, NF1, PALB2, PDGFRA, PMS2, POLD1, POLE, PTEN, RAD50, RAD51C,  RAD51D, SDHA, SDHB, SDHC, SDHD, SMAD4, SMARCA4. STK11, TP53, TSC1, TSC2, and VHL.  (2) right lumpectomy with sentinel lymph node sampling 12/29/2015 showed a pT1c pN0, stage IA invasive ductal carcinoma, grade 3, with negative though close margins (23m to DCIS)  (3) adjuvant chemotherapy with  weekly 11 with  every 3 weeks started 01/25/2016, completed 04/04/2016  (a) 12th Abraxane cycle omitted because of peripheral neuropathy  (4) trastuzumab to be continued to total one year (through January 2018)  (a) echo 03/13/2016 showed an ejection fraction of 55-60%; next echo scheduled 06/24/2016  (5) adjuvant radiation to be completed 06/27/2016  (6) anti-estrogens to follow radiation  (a) s/p hysterectomy July 2011  PLAN: KToreyis tolerating the radiation moderately well and in any case she will finish it at the end of next week.  She is doing fine with the Herceptin. She will have her next echocardiogram next week. The plan is to continue that to total one year, which means through January 2018.  Unfortunately she still has significant neuropathy symptoms. She is already on gabapentin for this and it is helping. However this is affecting her ability to work and her ability to do some normal activities. I'm hoping with some time the symptoms will improve. She is continuing also on B-12 supplementation  She will see me again with the late August Herceptin dose and at that point we will likely start anastrozole  She knows to call for any problems that may develop before her next visit  MChauncey Cruel MD   06/20/2016 6:17 PM

## 2016-06-21 ENCOUNTER — Ambulatory Visit
Admission: RE | Admit: 2016-06-21 | Discharge: 2016-06-21 | Disposition: A | Discharge status: Home / Self Care | Payer: BLUE CROSS/BLUE SHIELD | Source: Ambulatory Visit | Attending: Radiation Oncology | Admitting: Radiation Oncology

## 2016-06-21 DIAGNOSIS — C50411 Malignant neoplasm of upper-outer quadrant of right female breast: Secondary | ICD-10-CM | POA: Diagnosis not present

## 2016-06-21 DIAGNOSIS — Z170 Estrogen receptor positive status [ER+]: Secondary | ICD-10-CM | POA: Diagnosis not present

## 2016-06-21 DIAGNOSIS — Z510 Encounter for antineoplastic radiation therapy: Secondary | ICD-10-CM | POA: Diagnosis not present

## 2016-06-21 DIAGNOSIS — Z90710 Acquired absence of both cervix and uterus: Secondary | ICD-10-CM | POA: Diagnosis not present

## 2016-06-24 ENCOUNTER — Ambulatory Visit (HOSPITAL_COMMUNITY)
Admission: RE | Admit: 2016-06-24 | Discharge: 2016-06-24 | Disposition: A | Discharge status: Home / Self Care | Payer: BLUE CROSS/BLUE SHIELD | Source: Ambulatory Visit | Attending: Cardiology | Admitting: Cardiology

## 2016-06-24 ENCOUNTER — Ambulatory Visit
Admission: RE | Admit: 2016-06-24 | Discharge: 2016-06-24 | Disposition: A | Discharge status: Home / Self Care | Payer: BLUE CROSS/BLUE SHIELD | Source: Ambulatory Visit | Attending: Radiation Oncology | Admitting: Radiation Oncology

## 2016-06-24 ENCOUNTER — Ambulatory Visit (HOSPITAL_BASED_OUTPATIENT_CLINIC_OR_DEPARTMENT_OTHER)
Admission: RE | Admit: 2016-06-24 | Discharge: 2016-06-24 | Disposition: A | Discharge status: Home / Self Care | Payer: BLUE CROSS/BLUE SHIELD | Source: Ambulatory Visit | Attending: Cardiology | Admitting: Cardiology

## 2016-06-24 VITALS — BP 127/76 | HR 91 | Temp 98.2°F | Resp 20 | Wt 292.1 lb

## 2016-06-24 VITALS — BP 131/85 | HR 85 | Wt 288.2 lb

## 2016-06-24 DIAGNOSIS — C50411 Malignant neoplasm of upper-outer quadrant of right female breast: Secondary | ICD-10-CM

## 2016-06-24 DIAGNOSIS — Z79899 Other long term (current) drug therapy: Secondary | ICD-10-CM | POA: Insufficient documentation

## 2016-06-24 DIAGNOSIS — Z90710 Acquired absence of both cervix and uterus: Secondary | ICD-10-CM | POA: Diagnosis not present

## 2016-06-24 DIAGNOSIS — Z510 Encounter for antineoplastic radiation therapy: Secondary | ICD-10-CM | POA: Diagnosis not present

## 2016-06-24 DIAGNOSIS — I340 Nonrheumatic mitral (valve) insufficiency: Secondary | ICD-10-CM | POA: Insufficient documentation

## 2016-06-24 DIAGNOSIS — I071 Rheumatic tricuspid insufficiency: Secondary | ICD-10-CM | POA: Diagnosis not present

## 2016-06-24 DIAGNOSIS — Z170 Estrogen receptor positive status [ER+]: Secondary | ICD-10-CM | POA: Diagnosis not present

## 2016-06-24 DIAGNOSIS — I10 Essential (primary) hypertension: Secondary | ICD-10-CM | POA: Insufficient documentation

## 2016-06-24 MED ORDER — RADIAPLEXRX EX GEL
Freq: Once | CUTANEOUS | Status: AC
  Administered 2016-06-24: 16:00:00 via TOPICAL

## 2016-06-24 NOTE — Progress Notes (Signed)
Echocardiogram 2D Echocardiogram has been performed.  Vanessa [name] 06/24/2016, 9:39 AM

## 2016-06-24 NOTE — Patient Instructions (Signed)
Your physician recommends that you schedule a follow-up appointment in: 2 month

## 2016-06-24 NOTE — Progress Notes (Addendum)
Weekly right breast radiation 30/33 completed, , hyperpigmentation on breast,under axilla, and skin peeling under inframmary fold, using neosporin there and radaiplex bid elsewhere on breast,soreness only and tiredness, gave another radiaplex cream given, herceptin given last Thursday 06/20/16, appetite good, had echocardiogram today stated pateint 3:43 PM BP 127/76 mmHg  Pulse 91  Temp(Src) 98.2 F (36.8 C) (Oral)  Resp 20  Wt 292 lb 1.6 oz (132.496 kg)

## 2016-06-24 NOTE — Progress Notes (Addendum)
     Weekly Management Note:  outpatient    ICD-9-CM ICD-10-CM   1. Breast cancer of upper-outer quadrant of right female breast (HCC) 174.4 C50.411 hyaluronate sodium (RADIAPLEXRX) gel   Current Dose:  54.4 Gy  Projected Dose: 60.4 Gy   Narrative: The patient presents for routine under treatment assessment.  CBCT/MVCT images/Port film x-rays were reviewed.  Ms. Kleiber completed 30/33 right breast radiation. She has some hyperpigmentation on her right breast and under axilla. She has some skin peeling under inframammary fold. She is using Neosporin in her inframammary fold. She is using Radiaplex twice daily elsewhere on breast. She has some soreness only and some tiredness. She was given another Radiaplex cream. She had Herceptin given last Thursday 06/20/2016. She mentions her appetite is good. She mentions she had an echocardiogram today.   Physical Findings:  weight is 292 lb 1.6 oz (132.496 kg). Her oral temperature is 98.2 F (36.8 C). Her blood pressure is 127/76 and her pulse is 91. Her respiration is 20.   Wt Readings from Last 3 Encounters:  06/24/16 292 lb 1.6 oz (132.496 kg)  06/24/16 288 lb 4 oz (130.749 kg)  06/20/16 288 lb 11.2 oz (130.953 kg)   Hyperpigmentation and erythema throughout the right breast. Skin is intact.  Impression:  The patient is tolerating radiotherapy.   Plan:  Continue radiotherapy as planned. She finishes treatment later this week. She was given a one month follow up appointment. I recommend when she uses the rest of her Radiaplex to start using cream with Vitamin E. ________________________________   Eppie Gibson, M.D.    This document serves as a record of services personally performed by Eppie Gibson, MD. It was created on her behalf by Lendon Collar, a trained medical scribe. The creation of this record is based on the scribe's personal observations and the provider's statements to them. This document has been checked and approved by  the attending provider.

## 2016-06-25 ENCOUNTER — Ambulatory Visit
Admission: RE | Admit: 2016-06-25 | Discharge: 2016-06-25 | Disposition: A | Discharge status: Home / Self Care | Payer: BLUE CROSS/BLUE SHIELD | Source: Ambulatory Visit | Attending: Radiation Oncology | Admitting: Radiation Oncology

## 2016-06-25 DIAGNOSIS — C50411 Malignant neoplasm of upper-outer quadrant of right female breast: Secondary | ICD-10-CM | POA: Insufficient documentation

## 2016-06-25 DIAGNOSIS — Z510 Encounter for antineoplastic radiation therapy: Secondary | ICD-10-CM | POA: Diagnosis not present

## 2016-06-25 NOTE — Progress Notes (Signed)
Patient ID: Vanessa [name], female   DOB: 05/25/1961, 55 y.o.   MRN: [id number]  Oncologist: Dr. Jana Hakim  55 yo with breast cancer presents for cardiology evaluation prior to starting Herceptin.  She was diagnosed with triple positive breast cancer in 12/16.  Plans are lumpectomy/lymph node dissection on 12/30/15 followed by abraxane/Herceptin x 12 weeks then Herceptin alone to complete 1 year.  She will have radiation as well.    She is currently on Herceptin alone and has started radiation.  No exertional dyspnea.   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  Taking all medications.    Labs (12/16): K 3.7, creatinine 0.8  PMH: 1. HTN: Not currently on meds.  2. Trigeminal neuralgia 3. TAH 4. Breast cancer: Diagnosed 12/16, ER+/PR+/HER2+.   - Echo (1/17) with EF 60-65%, lateral s' 11, GLS -23.1%, mild LVH, normal RV size and systolic function.  - Echo (4/17) with EF 60%, GLS -18.1%, normal RV size and systolic function.  - Echo (7/17) with EF 60-65%, GLS -16.8%.   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   FH: Father with lung cancer, grandmother died from MI.  HTN, DM.   ROS: All systems reviewed and negative except as per HPI.   Current Outpatient Prescriptions  Medication Sig Dispense Refill  . acetaminophen (TYLENOL) 325 MG tablet Take 650 mg by mouth every 6 (six) hours as needed. Reported on 04/18/2016    . b complex vitamins tablet Take 1 tablet by mouth daily. Reported on 06/14/2016    . cetirizine (ZYRTEC) 10 MG tablet Take 10 mg by mouth daily as needed. Reported on 06/14/2016    . Docusate Sodium (COLACE PO) Take 1 tablet by mouth as needed. Reported on 06/24/2016    . gabapentin (NEURONTIN) 300 MG capsule Take 1 capsule (300 mg total) by mouth at bedtime. 90 capsule 0  . gabapentin (NEURONTIN) 600 MG tablet TAKE 1 TABLET (600 MG TOTAL) BY MOUTH 3 (THREE) TIMES DAILY. 90 tablet 6  . lidocaine-prilocaine (EMLA)  cream Apply 1 application topically as needed. 30 g 1  . Multiple Vitamins-Minerals (CENTRUM SILVER ADULT 50+ PO) Take by mouth. Reported on 03/14/2016    . ondansetron (ZOFRAN ODT) 4 MG disintegrating tablet Take 1 tablet (4 mg total) by mouth every 8 (eight) hours as needed for nausea or vomiting. (Patient not taking: Reported on 06/24/2016) 20 tablet 1  . senna (SENOKOT) 8.6 MG tablet Take 1 tablet by mouth as needed. Reported on 06/24/2016    . traMADol (ULTRAM) 50 MG tablet Take 1 tablet (50 mg total) by mouth 2 (two) times daily as needed. (Patient not taking: Reported on 06/24/2016) 50 tablet 0  . hyaluronate sodium (RADIAPLEXRX) GEL Apply 1 application topically 2 (two) times daily.     No current facility-administered medications for this encounter.   BP 131/85 mmHg  Pulse 85  Wt 288 lb 4 oz (130.749 kg)  SpO2 99% General: NAD Neck: No JVD, no thyromegaly or thyroid nodule.  Lungs: Clear to auscultation bilaterally with normal respiratory effort. CV: Nondisplaced PMI.  Heart regular S1/S2, no S3/S4, no murmur.  No peripheral edema.  No carotid bruit.  Normal pedal pulses.  Abdomen: Soft, nontender, no hepatosplenomegaly, no distention.  Skin: Intact without lesions or rashes.  Neurologic: Alert and oriented x 3.  Psych: Normal affect. Extremities: No clubbing or cyanosis.  HEENT: Normal.   Assessment/Plan: 1. Breast cancer:  She continues on Herceptin therapy.  I reviewed today's echo which showed good EF but GLS was decreased a bit from prior echo (<10% difference).  For now, I will let her continue Herceptin but will get a repeat echo in 2 months rather than 3 months.  2. HTN: BP controlled.   Follow up in 2 months with an ECHO   Loralie Champagne 06/25/2016

## 2016-06-26 ENCOUNTER — Ambulatory Visit
Admission: RE | Admit: 2016-06-26 | Discharge: 2016-06-26 | Disposition: A | Discharge status: Home / Self Care | Payer: BLUE CROSS/BLUE SHIELD | Source: Ambulatory Visit | Attending: Radiation Oncology | Admitting: Radiation Oncology

## 2016-06-26 ENCOUNTER — Encounter: Payer: Self-pay | Admitting: Radiation Oncology

## 2016-06-26 VITALS — BP 144/85 | HR 91 | Temp 98.1°F

## 2016-06-26 DIAGNOSIS — C50411 Malignant neoplasm of upper-outer quadrant of right female breast: Secondary | ICD-10-CM

## 2016-06-26 DIAGNOSIS — Z510 Encounter for antineoplastic radiation therapy: Secondary | ICD-10-CM | POA: Diagnosis not present

## 2016-06-26 NOTE — Progress Notes (Signed)
  Radiation Oncology         [phone] ________________________________  Name: Vanessa [name] MRN: [id number]  Date: 06/26/2016  DOB: February 17, 1961  Weekly Radiation Therapy Management  Current Dose: 58.4 Gy     Planned Dose:  60.4 Gy  Narrative . . . . . . . . The patient presents for an  under treatment assessment.                               [name] presents today complaining of an itchy rash to her LEFT breast. She had an echocardiogram on Monday and has developed increased redness and itching under her left breast since that time. She has tried hydrocortisone cream without relief and even took oral benadryl last night. Her right breast continues to have hyperpigmentation with peeling, noted by the nurse, underneath her right breast which she has been using neosporin on. She also has a new area of peeling to her right axilla area which she will begin using neosporin to also. She is using radiaplex to the areas of her Right Breast. She denies drainage in the right breast.                                 Set-up films were reviewed.                                 The chart was checked. Physical Findings. . .  temperature is 98.1 F (36.7 C). Her blood pressure is 144/85 and her pulse is 91. Her oxygen saturation is 100%. . Lungs are clear to auscultation bilaterally. Heart has regular rate and rhythm. Right breast shows hyperpigmentation changes and some dry desquamation. Right axillary region has some skin breakdown. Left breast in the inframammary fold has an erythematous area where the ultrasound probe was placed. Impression . . . . . . . The patient is tolerating radiation. Patient may have a yeast infection in the left inframammary fold. She had ultrasound jelly in the area for hours before she could remove it that night since she was at work. Patient will be given antifungal powder to place along the left inframammary fold. The patient will call if this area does not clear up with  this treatment. Plan . . . . . . . . . . . . Continue treatment as planned. She will use neosporin in the right axilla where she has skin breakdown. The patient is scheduled to complete radiation treatment tomorrow. She will follow up in 1 month with Dr. Isidore Moos.  ________________________________   Blair Promise, PhD, MD  This document serves as a record of services personally performed by Gery Pray, MD. It was created on his behalf by Darcus Austin, a trained medical scribe. The creation of this record is based on the scribe's personal observations and the provider's statements to them. This document has been checked and approved by the attending provider.

## 2016-06-26 NOTE — Progress Notes (Signed)
Vanessa [name] presents today complaining of an itchy rash to her LEFT breast. She had an echocardiogram on Monday and has developed increased redness and itching under her left breast since that time. She has tried hydrocortisone cream without relief, and even took oral benadryl last night. Her Right breast continues to have hyperpigmentation with peeling noted underneath her right breast which she has been using neosporin on.  She also has a new area of peeling to her right axilla area which she will begin using neosporin to also. She is using radiaplex to the areas of her Right Breast.   BP 144/85 mmHg  Pulse 91  Temp(Src) 98.1 F (36.7 C)  SpO2 100%

## 2016-06-27 ENCOUNTER — Ambulatory Visit
Admission: RE | Admit: 2016-06-27 | Discharge: 2016-06-27 | Disposition: A | Discharge status: Home / Self Care | Payer: BLUE CROSS/BLUE SHIELD | Source: Ambulatory Visit | Attending: Radiation Oncology | Admitting: Radiation Oncology

## 2016-06-27 ENCOUNTER — Encounter: Payer: Self-pay | Admitting: Radiation Oncology

## 2016-06-27 ENCOUNTER — Telehealth: Payer: Self-pay | Admitting: *Deleted

## 2016-06-27 ENCOUNTER — Other Ambulatory Visit: Payer: Self-pay | Admitting: *Deleted

## 2016-06-27 DIAGNOSIS — C50411 Malignant neoplasm of upper-outer quadrant of right female breast: Secondary | ICD-10-CM | POA: Diagnosis not present

## 2016-06-27 DIAGNOSIS — Z510 Encounter for antineoplastic radiation therapy: Secondary | ICD-10-CM | POA: Diagnosis not present

## 2016-06-27 NOTE — Telephone Encounter (Signed)
  Oncology Nurse Navigator Documentation  Navigator Location: [facility] (06/27/16 1200) Navigator Encounter Type: Treatment (06/27/16 1200)           Patient Visit Type: RadOnc (06/27/16 1200) Treatment Phase: Final Radiation Tx (06/27/16 1200)                            Time Spent with Patient: 15 (06/27/16 1200)

## 2016-06-29 ENCOUNTER — Telehealth: Payer: Self-pay | Admitting: Oncology

## 2016-06-29 NOTE — Telephone Encounter (Signed)
Added appointments for 8/24 and 9/14. Spoke with patient, she is aware and will get new schedule at 8/3 visit.

## 2016-07-03 ENCOUNTER — Other Ambulatory Visit: Payer: Self-pay | Admitting: Oncology

## 2016-07-11 ENCOUNTER — Telehealth: Payer: Self-pay

## 2016-07-11 ENCOUNTER — Ambulatory Visit: Payer: Self-pay

## 2016-07-11 ENCOUNTER — Other Ambulatory Visit: Payer: Self-pay | Admitting: Oncology

## 2016-07-11 ENCOUNTER — Ambulatory Visit (HOSPITAL_BASED_OUTPATIENT_CLINIC_OR_DEPARTMENT_OTHER): Payer: BLUE CROSS/BLUE SHIELD

## 2016-07-11 ENCOUNTER — Other Ambulatory Visit (HOSPITAL_BASED_OUTPATIENT_CLINIC_OR_DEPARTMENT_OTHER): Payer: BLUE CROSS/BLUE SHIELD

## 2016-07-11 VITALS — BP 141/82 | HR 84 | Temp 98.4°F | Resp 20

## 2016-07-11 DIAGNOSIS — C50411 Malignant neoplasm of upper-outer quadrant of right female breast: Secondary | ICD-10-CM | POA: Diagnosis not present

## 2016-07-11 DIAGNOSIS — Z5112 Encounter for antineoplastic immunotherapy: Secondary | ICD-10-CM

## 2016-07-11 LAB — CBC WITH DIFFERENTIAL/PLATELET
BASO%: 0.6 % (ref 0.0–2.0)
Basophils Absolute: 0 10*3/uL (ref 0.0–0.1)
EOS%: 6.1 % (ref 0.0–7.0)
Eosinophils Absolute: 0.2 10*3/uL (ref 0.0–0.5)
HEMATOCRIT: 37.3 % (ref 34.8–46.6)
HGB: 12 g/dL (ref 11.6–15.9)
LYMPH#: 1.3 10*3/uL (ref 0.9–3.3)
LYMPH%: 42.4 % (ref 14.0–49.7)
MCH: 27 pg (ref 25.1–34.0)
MCHC: 32.2 g/dL (ref 31.5–36.0)
MCV: 83.8 fL (ref 79.5–101.0)
MONO#: 0.2 10*3/uL (ref 0.1–0.9)
MONO%: 7.8 % (ref 0.0–14.0)
NEUT%: 43.1 % (ref 38.4–76.8)
NEUTROS ABS: 1.3 10*3/uL — AB (ref 1.5–6.5)
PLATELETS: 209 10*3/uL (ref 145–400)
RBC: 4.45 10*6/uL (ref 3.70–5.45)
RDW: 13.9 % (ref 11.2–14.5)
WBC: 3.1 10*3/uL — AB (ref 3.9–10.3)

## 2016-07-11 LAB — COMPREHENSIVE METABOLIC PANEL
ALT: 18 U/L (ref 0–55)
AST: 20 U/L (ref 5–34)
Albumin: 3.4 g/dL — ABNORMAL LOW (ref 3.5–5.0)
Alkaline Phosphatase: 118 U/L (ref 40–150)
Anion Gap: 8 mEq/L (ref 3–11)
BUN: 10.9 mg/dL (ref 7.0–26.0)
CO2: 26 mEq/L (ref 22–29)
Calcium: 9.1 mg/dL (ref 8.4–10.4)
Chloride: 109 mEq/L (ref 98–109)
Creatinine: 0.7 mg/dL (ref 0.6–1.1)
EGFR: >90 mL/min/{1.73_m2} (ref >90)
Glucose: 96 mg/dl (ref 70–140)
Potassium: 3.6 mEq/L (ref 3.5–5.1)
Sodium: 143 mEq/L (ref 136–145)
Total Bilirubin: <0.3 mg/dL (ref 0.20–1.20)
Total Protein: 7.3 g/dL (ref 6.4–8.3)

## 2016-07-11 MED ORDER — HEPARIN SOD (PORK) LOCK FLUSH 100 UNIT/ML IV SOLN
500.0000 [IU] | Freq: Once | INTRAVENOUS | Status: AC | PRN
  Administered 2016-07-11: 500 [IU]
  Filled 2016-07-11: qty 5

## 2016-07-11 MED ORDER — TRASTUZUMAB CHEMO 150 MG IV SOLR
6.0000 mg/kg | Freq: Once | INTRAVENOUS | Status: AC
  Administered 2016-07-11: 840 mg via INTRAVENOUS
  Filled 2016-07-11: qty 40

## 2016-07-11 MED ORDER — SODIUM CHLORIDE 0.9% FLUSH
10.0000 mL | INTRAVENOUS | Status: DC | PRN
  Administered 2016-07-11: 10 mL
  Filled 2016-07-11: qty 10

## 2016-07-11 MED ORDER — ACETAMINOPHEN 325 MG PO TABS
ORAL_TABLET | ORAL | Status: AC
  Filled 2016-07-11: qty 2

## 2016-07-11 MED ORDER — DIPHENHYDRAMINE HCL 25 MG PO CAPS
50.0000 mg | ORAL_CAPSULE | Freq: Once | ORAL | Status: AC
  Administered 2016-07-11: 50 mg via ORAL

## 2016-07-11 MED ORDER — ACETAMINOPHEN 325 MG PO TABS
650.0000 mg | ORAL_TABLET | Freq: Once | ORAL | Status: AC
  Administered 2016-07-11: 650 mg via ORAL

## 2016-07-11 MED ORDER — SODIUM CHLORIDE 0.9 % IV SOLN
Freq: Once | INTRAVENOUS | Status: AC
  Administered 2016-07-11: 09:00:00 via INTRAVENOUS

## 2016-07-11 MED ORDER — DIPHENHYDRAMINE HCL 25 MG PO CAPS
ORAL_CAPSULE | ORAL | Status: AC
  Filled 2016-07-11: qty 2

## 2016-07-11 MED ORDER — TRASTUZUMAB CHEMO INJECTION 440 MG
6.0000 mg/kg | Freq: Once | INTRAVENOUS | Status: DC
  Filled 2016-07-11: qty 40

## 2016-07-11 NOTE — Telephone Encounter (Signed)
S/w Dr Lindi Adie re [place] 1.3. No orders. Pt to follow neutropenic precautions. Avoid crowds, watch for fever or signs of infection, vigilant hand washing. Call if any questions or concerns. Pt spoke next OV with Dr Jana Hakim.

## 2016-07-11 NOTE — Patient Instructions (Signed)
[facility] Discharge Instructions for Patients Receiving Chemotherapy  Today you received the following chemotherapy agents Herceptin  To help prevent nausea and vomiting after your treatment, we encourage you to take your nausea medication    If you develop nausea and vomiting that is not controlled by your nausea medication, call the clinic.   BELOW ARE SYMPTOMS THAT SHOULD BE REPORTED IMMEDIATELY:  *FEVER GREATER THAN 100.5 F  *CHILLS WITH OR WITHOUT FEVER  NAUSEA AND VOMITING THAT IS NOT CONTROLLED WITH YOUR NAUSEA MEDICATION  *UNUSUAL SHORTNESS OF BREATH  *UNUSUAL BRUISING OR BLEEDING  TENDERNESS IN MOUTH AND THROAT WITH OR WITHOUT PRESENCE OF ULCERS  *URINARY PROBLEMS  *BOWEL PROBLEMS  UNUSUAL RASH Items with * indicate a potential emergency and should be followed up as soon as possible.  Feel free to call the clinic you have any questions or concerns. The clinic phone number is [phone].  Please show the CHEMO ALERT CARD at check-in to the Emergency Department and triage nurse.   

## 2016-07-24 NOTE — Progress Notes (Signed)
  Radiation Oncology         [phone] ________________________________  Name: Vanessa [name] MRN: [id number]  Date: 06/27/2016  DOB: 1960-12-27  End of Treatment Note  DIAGNOSIS:     ICD-9-CM ICD-10-CM   1. Breast cancer of upper-outer quadrant of right female breast ([place]) 174.4 C50.411    Indication for treatment:  Curative       Radiation treatment dates:   05/13/2016-06/27/2016  Site/dose:    1. The Right breast tangents were treated to 50.4 Gy in 28 fractions at 1.8 Gy per fraction. 2. The Right breast was boosted to 10 Gy in 5 fractions at 2 Gy per fraction.  Beams/energy:    1. 3D // 10X, 15X 2. 3D // 15X, 6X  Narrative: The patient tolerated radiation treatment relatively well.   She experienced an itchy rash on her Left breast. The patient developed hyperpigmentation changes and some dry desquamation over the right breast. The right axillary region also showed some skin breakdown.  Plan: The patient has completed radiation treatment. The patient will return to radiation oncology clinic for routine followup in one month. The patient was given antifugal powder to place along the left inframammary fold and will call if this area does not clear up with this treatment. I advised them to call or return sooner if they have any questions or concerns related to their recovery or treatment.  -----------------------------------  Eppie Gibson, MD   This document serves as a record of services personally performed by Eppie Gibson, MD. It was created on her behalf by Arlyce Harman, a trained medical scribe. The creation of this record is based on the scribe's personal observations and the provider's statements to them. This document has been checked and approved by the attending provider.

## 2016-07-31 ENCOUNTER — Other Ambulatory Visit: Payer: Self-pay | Admitting: *Deleted

## 2016-07-31 DIAGNOSIS — C50411 Malignant neoplasm of upper-outer quadrant of right female breast: Secondary | ICD-10-CM

## 2016-08-01 ENCOUNTER — Ambulatory Visit (HOSPITAL_BASED_OUTPATIENT_CLINIC_OR_DEPARTMENT_OTHER): Payer: BLUE CROSS/BLUE SHIELD

## 2016-08-01 ENCOUNTER — Ambulatory Visit (HOSPITAL_BASED_OUTPATIENT_CLINIC_OR_DEPARTMENT_OTHER): Payer: BLUE CROSS/BLUE SHIELD | Admitting: Oncology

## 2016-08-01 ENCOUNTER — Ambulatory Visit: Payer: BLUE CROSS/BLUE SHIELD

## 2016-08-01 ENCOUNTER — Other Ambulatory Visit (HOSPITAL_BASED_OUTPATIENT_CLINIC_OR_DEPARTMENT_OTHER): Payer: BLUE CROSS/BLUE SHIELD

## 2016-08-01 ENCOUNTER — Other Ambulatory Visit: Payer: Self-pay | Admitting: Oncology

## 2016-08-01 ENCOUNTER — Telehealth: Payer: Self-pay | Admitting: Oncology

## 2016-08-01 VITALS — BP 146/75 | HR 81 | Temp 98.2°F | Resp 18 | Ht 64.0 in | Wt 294.1 lb

## 2016-08-01 DIAGNOSIS — R209 Unspecified disturbances of skin sensation: Secondary | ICD-10-CM

## 2016-08-01 DIAGNOSIS — C50411 Malignant neoplasm of upper-outer quadrant of right female breast: Secondary | ICD-10-CM

## 2016-08-01 DIAGNOSIS — Z5112 Encounter for antineoplastic immunotherapy: Secondary | ICD-10-CM

## 2016-08-01 DIAGNOSIS — Z170 Estrogen receptor positive status [ER+]: Secondary | ICD-10-CM

## 2016-08-01 DIAGNOSIS — Z95828 Presence of other vascular implants and grafts: Secondary | ICD-10-CM

## 2016-08-01 LAB — CBC WITH DIFFERENTIAL/PLATELET
BASO%: 0.6 % (ref 0.0–2.0)
BASOS ABS: 0 10*3/uL (ref 0.0–0.1)
EOS%: 4.9 % (ref 0.0–7.0)
Eosinophils Absolute: 0.2 10*3/uL (ref 0.0–0.5)
HEMATOCRIT: 38.2 % (ref 34.8–46.6)
HEMOGLOBIN: 12.3 g/dL (ref 11.6–15.9)
LYMPH#: 1.2 10*3/uL (ref 0.9–3.3)
LYMPH%: 37.5 % (ref 14.0–49.7)
MCH: 26.9 pg (ref 25.1–34.0)
MCHC: 32.2 g/dL (ref 31.5–36.0)
MCV: 83.4 fL (ref 79.5–101.0)
MONO#: 0.2 10*3/uL (ref 0.1–0.9)
MONO%: 7.4 % (ref 0.0–14.0)
NEUT#: 1.6 10*3/uL (ref 1.5–6.5)
NEUT%: 49.6 % (ref 38.4–76.8)
Platelets: 207 10*3/uL (ref 145–400)
RBC: 4.58 10*6/uL (ref 3.70–5.45)
RDW: 14 % (ref 11.2–14.5)
WBC: 3.3 10*3/uL — ABNORMAL LOW (ref 3.9–10.3)

## 2016-08-01 LAB — COMPREHENSIVE METABOLIC PANEL
ALBUMIN: 3.3 g/dL — AB (ref 3.5–5.0)
ALK PHOS: 118 U/L (ref 40–150)
ALT: 17 U/L (ref 0–55)
AST: 21 U/L (ref 5–34)
Anion Gap: 8 mEq/L (ref 3–11)
BUN: 14.8 mg/dL (ref 7.0–26.0)
CALCIUM: 9.2 mg/dL (ref 8.4–10.4)
CO2: 26 mEq/L (ref 22–29)
Chloride: 107 mEq/L (ref 98–109)
Creatinine: 0.7 mg/dL (ref 0.6–1.1)
Glucose: 101 mg/dl (ref 70–140)
POTASSIUM: 3.5 meq/L (ref 3.5–5.1)
SODIUM: 141 meq/L (ref 136–145)
Total Bilirubin: <0.3 mg/dL (ref 0.20–1.20)
Total Protein: 7.4 g/dL (ref 6.4–8.3)

## 2016-08-01 MED ORDER — DIPHENHYDRAMINE HCL 25 MG PO CAPS
25.0000 mg | ORAL_CAPSULE | Freq: Once | ORAL | Status: AC
  Administered 2016-08-01: 25 mg via ORAL

## 2016-08-01 MED ORDER — TRASTUZUMAB CHEMO 150 MG IV SOLR
6.0000 mg/kg | Freq: Once | INTRAVENOUS | Status: AC
  Administered 2016-08-01: 840 mg via INTRAVENOUS
  Filled 2016-08-01: qty 11.48

## 2016-08-01 MED ORDER — SODIUM CHLORIDE 0.9 % IJ SOLN
10.0000 mL | INTRAMUSCULAR | Status: DC | PRN
  Administered 2016-08-01: 10 mL via INTRAVENOUS
  Filled 2016-08-01: qty 10

## 2016-08-01 MED ORDER — SODIUM CHLORIDE 0.9 % IV SOLN
Freq: Once | INTRAVENOUS | Status: AC
  Administered 2016-08-01: 10:00:00 via INTRAVENOUS

## 2016-08-01 MED ORDER — ACETAMINOPHEN 325 MG PO TABS
ORAL_TABLET | ORAL | Status: AC
  Filled 2016-08-01: qty 2

## 2016-08-01 MED ORDER — SODIUM CHLORIDE 0.9% FLUSH
10.0000 mL | INTRAVENOUS | Status: DC | PRN
  Administered 2016-08-01: 10 mL
  Filled 2016-08-01: qty 10

## 2016-08-01 MED ORDER — ACETAMINOPHEN 325 MG PO TABS
650.0000 mg | ORAL_TABLET | Freq: Once | ORAL | Status: AC
  Administered 2016-08-01: 650 mg via ORAL

## 2016-08-01 MED ORDER — DIPHENHYDRAMINE HCL 25 MG PO CAPS
ORAL_CAPSULE | ORAL | Status: AC
  Filled 2016-08-01: qty 1

## 2016-08-01 MED ORDER — HEPARIN SOD (PORK) LOCK FLUSH 100 UNIT/ML IV SOLN
500.0000 [IU] | Freq: Once | INTRAVENOUS | Status: AC | PRN
  Administered 2016-08-01: 500 [IU]
  Filled 2016-08-01: qty 5

## 2016-08-01 NOTE — Addendum Note (Signed)
Addended by: Chauncey Cruel on: 08/01/2016 09:44 AM   Modules accepted: Orders

## 2016-08-01 NOTE — Progress Notes (Signed)
[facility]  [phone] [phone]     ID: JOLEA DOLLE DOB: 1961-02-23  MR#: [id number]  MPN#:[id number]  Patient Care Team: Aretta Nip, MD as PCP - General (Family Medicine) Erroll Luna, MD as Consulting Physician (General Surgery) Chauncey Cruel, MD as Consulting Physician (Oncology) Marcial Pacas, MD as Consulting Physician (Neurology) Gean Birchwood, DPM as Consulting Physician (Podiatry) Servando Salina, MD as Consulting Physician (Obstetrics and Gynecology) Eppie Gibson, MD as Attending Physician (Radiation Oncology) Larey Dresser, MD as Consulting Physician (Cardiology) PCP: Aretta Nip, MD OTHER MD:  CHIEF COMPLAINT: Triple positive breast cancer  CURRENT TREATMENT: trastuzumab, tamoxifen 30  BREAST CANCER HISTORY: From the original intake note:  Fable had screening mammography at Dr. cousins office on 11/06/2015 suggesting a change in the right breast. She was scheduled for right diagnostic mammography and tomosynthesis with ultrasonography at the [facility] 11/13/2015. The breast density was category B. In the upper outer quadrant of the right breast there was a small irregular mass with suspicious internal calcifications. By exam this was small, mobile, firm, and located at the 10:00 position 7 cm from the nipple. Ultrasound of the right breast confirmed an irregularly marginated hypoechoic mass measuring 1.1 cm. The right axilla was sonographically benign.  Biopsy of the right breast mass in question 11/15/2015 showed ([id number]) an invasive ductal carcinoma, grade 2, estrogen receptor 100% positive, progesterone receptor 2% positive, both with strong staining intensity, with an MIB-1 of 70%, and HER-2 amplification, the signals ratio being 4.92 and the number per cell 17.45.  The patient's subsequent history is as detailed below.  INTERVAL HISTORY: Hermine returns today for follow-up of her triple  positive  breast cancer. Since her last visit here she completed her radiation treatments. She had significant fatigue and she still feels some fatigue although she is pursuing normal activities. She had significant skin changes but they are improving. She is still using the radio plaque screening. She is now ready to start anti-estrogens  REVIEW OF SYSTEMS: Inaaya still has peripheral neuropathy symptoms and particularly at night she has some tingling and discomfort in the finger pads. She has less of an issue on her feet. She is on significant doses of gabapentin and this helps her type and control the problem. She sleeps poorly. She has pain in the lower and lateral aspect of her right breast. This is not constant. She has a sore throat at times, but no cough and no shortness of breath. She also has pain in her knees, and she is taking Tylenol once or twice daily for that. A detailed review of systems today was otherwise stable.  PAST MEDICAL HISTORY: Past Medical History:  Diagnosis Date  . Allergy   . Anemia   . Anxiety   . Arthritis   . Family history of breast cancer   . Family history of colon cancer   . Family history of thyroid cancer   . Hypertension    no meds now  . PONV (postoperative nausea and vomiting)   . Trigeminal neuralgia 05/2014    PAST SURGICAL HISTORY: Past Surgical History:  Procedure Laterality Date  . ABDOMINAL HYSTERECTOMY    . BREAST LUMPECTOMY WITH RADIOACTIVE SEED AND SENTINEL LYMPH NODE BIOPSY Right 12/29/2015   Procedure: RIGHT BREAST  RADIOACTIVE SEED LOCALIZATION LUMPECTOMY AND RIGHT SENTINEL LYMPH NODE MAPPING;  Surgeon: Erroll Luna, MD;  Location: [facility];  Service: General;  Laterality: Right;  . BREAST SURGERY  biopsy  . EYE SURGERY    . MENISCUS REPAIR     L knee  . PORTACATH PLACEMENT N/A 12/29/2015   Procedure: INSERTION PORT-A-CATH;  Surgeon: Erroll Luna, MD;  Location: [facility];  Service: General;   Laterality: N/A;    FAMILY HISTORY Family History  Problem Relation Age of Onset  . Diabetes Mother   . Hypertension Mother   . Asthma Daughter   . Diabetes Sister   . Hypertension Sister   . Colon cancer Sister 61  . Thyroid cancer Sister 74  . Lung cancer Father   . Breast cancer Cousin     maternal first cousin  . Breast cancer Cousin 68    maternal first cousin - inflammatory   the patient's father died from lung cancer at the age of 13, in the setting of tobacco abuse. The patient's mother is living, at age 78. The patient had 2 brothers, 3 sisters. One sister was diagnosed with colon cancer in her 36s and later thyroid cancer. She died from metastatic disease. One brother died with pulmonary problems. On the maternal side a cousin was diagnosed with inflammatory breast cancer at the age of 97. A second cousin on the maternal side was diagnosed with breast cancer in her early 67s. There is no history of ovarian cancer in the family.  GYNECOLOGIC HISTORY:  No LMP recorded. Patient is postmenopausal. Menarche age 40, first live birth age 42, the patient is [place] P2. She underwent simple hysterectomy with bilateral salpingectomy 06/28/2010, with benign pathology([id number]). She still has her ovaries in place. 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 She describes herself is single. At home she lives with her daughter Kimbella Heisler, who teaches theater at [employer] and is currently studying towards a Masters in counseling; and her son [name], who is a professional basketball player in the international circuit (currently with the [employer]). The patient has no grandchildren. She attends a Tour manager    ADVANCED DIRECTIVES: Not in place. At the initial clinic visit 08/01/2016 the patient was given the appropriate forms to complete and  notarize at her discretion.  HEALTH MAINTENANCE: Social History  Substance Use Topics  . Smoking status: Never Smoker  . Smokeless tobacco: Never Used  . Alcohol use Yes     Comment: social     Colonoscopy: 2016/[facility]  PAP:  Bone density:  Lipid panel:  Allergies  Allergen Reactions  . Penicillins Anaphylaxis  . Cyclobenzaprine Swelling  . Hydrocodone Itching  . Meperidine Swelling  . Amoxicillin-Pot Clavulanate Rash  . Cefaclor Rash  . Codeine Rash  . Darvocet [Propoxyphene N-Acetaminophen] Rash  . Demerol Rash  . Erythromycin Rash  . Flexeril [Cyclobenzaprine Hcl] Rash  . Percocet [Oxycodone-Acetaminophen] Itching and Rash    Red streaks come up on skin  . Sulfa Antibiotics Rash    Current Outpatient Prescriptions  Medication Sig Dispense Refill  . acetaminophen (TYLENOL) 325 MG tablet Take 650 mg by mouth every 6 (six) hours as needed. Reported on 04/18/2016    . b complex vitamins tablet Take 1 tablet by mouth daily. Reported on 06/14/2016    . cetirizine (ZYRTEC) 10 MG tablet Take 10 mg by mouth daily as needed. Reported on 06/14/2016    . Docusate Sodium (COLACE PO) Take 1 tablet by mouth as needed. Reported on 06/24/2016    . gabapentin (NEURONTIN) 300  MG capsule TAKE 1 CAPSULE (300 MG TOTAL) BY MOUTH AT BEDTIME. 90 capsule 0  . gabapentin (NEURONTIN) 600 MG tablet TAKE 1 TABLET (600 MG TOTAL) BY MOUTH 3 (THREE) TIMES DAILY. 90 tablet 6  . hyaluronate sodium (RADIAPLEXRX) GEL Apply 1 application topically 2 (two) times daily.    Marland Kitchen lidocaine-prilocaine (EMLA) cream APPLY TOPICALLY ONCE DAILY AS NEEDED 30 g 1  . Multiple Vitamins-Minerals (CENTRUM SILVER ADULT 50+ PO) Take by mouth. Reported on 03/14/2016    . ondansetron (ZOFRAN ODT) 4 MG disintegrating tablet Take 1 tablet (4 mg total) by mouth every 8 (eight) hours as needed for nausea or vomiting. (Patient not taking: Reported on 06/24/2016) 20 tablet 1  . senna (SENOKOT) 8.6 MG tablet Take 1 tablet by mouth as needed.  Reported on 06/24/2016    . traMADol (ULTRAM) 50 MG tablet Take 1 tablet (50 mg total) by mouth 2 (two) times daily as needed. (Patient not taking: Reported on 06/24/2016) 50 tablet 0   No current facility-administered medications for this visit.     OBJECTIVE: Middle-aged African-American woman Who appears well Vitals:   08/01/16 0833  BP: (!) 146/75  Pulse: 81  Resp: 18  Temp: 98.2 F (36.8 C)     Body mass index is 50.48 kg/m.    ECOG FS:1 - Symptomatic but completely ambulatory  Sclerae unicteric, EOMs intact Oropharynx clear and moist No cervical or supraclavicular adenopathy Lungs no rales or rhonchi Heart regular rate and rhythm Abd soft, obese, nontender, positive bowel sounds MSK no focal spinal tenderness, no upper extremity lymphedema Neuro: nonfocal, well oriented, appropriate affect Breasts: The right breast is status post lumpectomy and radiation. There is some residual hyperpigmentation. There is no suspicious mass. The right axilla is benign. The left breast is unremarkable   LAB RESULTS:  CMP     Component Value Date/Time   NA 143 07/11/2016 0801   K 3.6 07/11/2016 0801   CL 106 12/20/2015 1120   CO2 26 07/11/2016 0801   GLUCOSE 96 07/11/2016 0801   BUN 10.9 07/11/2016 0801   CREATININE 0.7 07/11/2016 0801   CALCIUM 9.1 07/11/2016 0801   PROT 7.3 07/11/2016 0801   ALBUMIN 3.4 (L) 07/11/2016 0801   AST 20 07/11/2016 0801   ALT 18 07/11/2016 0801   ALKPHOS 118 07/11/2016 0801   BILITOT <0.30 07/11/2016 0801   GFRNONAA >60 12/20/2015 1120   GFRAA >60 12/20/2015 1120    INo results found for: SPEP, UPEP  Lab Results  Component Value Date   WBC 3.3 (L) 08/01/2016   NEUTROABS 1.6 08/01/2016   HGB 12.3 08/01/2016   HCT 38.2 08/01/2016   MCV 83.4 08/01/2016   PLT 207 08/01/2016      Chemistry      Component Value Date/Time   NA 143 07/11/2016 0801   K 3.6 07/11/2016 0801   CL 106 12/20/2015 1120   CO2 26 07/11/2016 0801   BUN 10.9  07/11/2016 0801   CREATININE 0.7 07/11/2016 0801      Component Value Date/Time   CALCIUM 9.1 07/11/2016 0801   ALKPHOS 118 07/11/2016 0801   AST 20 07/11/2016 0801   ALT 18 07/11/2016 0801   BILITOT <0.30 07/11/2016 0801      No results found for: LABCA2  No components found for: LABCA125  No results for input(s): INR in the last 168 hours.  Urinalysis No results found for: COLORURINE, APPEARANCEUR, LABSPEC, PHURINE, GLUCOSEU, HGBUR, BILIRUBINUR, KETONESUR, PROTEINUR, UROBILINOGEN, NITRITE, LEUKOCYTESUR  STUDIES:  No results found. Echocardiography  Patient:    Dustina, Scoggin MR #:       [id number] Study Date: 06/24/2016 Gender:     F Age:        40 Height:     162.6 cm Weight:     131 kg BSA:        2.51 m^2 Pt. Status: Room:   ATTENDING    Loralie Champagne, M.D.  ORDERING     Loralie Champagne, M.D.  REFERRING    Loralie Champagne, M.D.  PERFORMING   [facility], Outpatient  SONOGRAPHER  Madelin Rear, RDCS  cc:  ------------------------------------------------------------------- LV EF: 60% -   65%    ASSESSMENT: 55 y.o. [place] woman status post right breast upper outer quadrant biopsy 11/15/2015 for a clinical T1c N0, stage IA invasive ductal carcinoma, grade 2, estrogen and progesterone receptor positive, HER-2 amplified, with an MIB-1 of 70%  (1) Genetics testing 12/22/2015 through the Hereditary Gene Panel offered by [facility] found no deleterious mutations in  APC, ATM, AXIN2, BARD1, BMPR1A, BRCA1, BRCA2, BRIP1, CDH1, CDKN2A, CHEK2, DICER1, EPCAM, GREM1, KIT, MEN1, MLH1, MSH2, MSH6, MUTYH, NBN, NF1, PALB2, PDGFRA, PMS2, POLD1, POLE, PTEN, RAD50, RAD51C, RAD51D, SDHA, SDHB, SDHC, SDHD, SMAD4, SMARCA4. STK11, TP53, TSC1, TSC2, and VHL.  (2) right lumpectomy with sentinel lymph node sampling 12/29/2015 showed a pT1c pN0, stage IA invasive ductal carcinoma, grade 3, with negative though close margins (102m to DCIS)  (3) adjuvant chemotherapy with weekly Abraxane 11  given 01/25/2016 through 03/28/2016 with trastuzumab every 3 weeks started 01/25/2016  (a) 12th Abraxane cycle omitted because of peripheral neuropathy  (4) trastuzumab to be continued to total one year (through January 2018)  (a) echo 06/24/2016 showed an ejection fraction of 60-65 %  (5) adjuvant radiation completed 06/27/2016  (6) tamoxifen started 08/09/2016  (a) s/p hysterectomy July 2011  PLAN: KMikelais now 8 months out from her definitive surgery. She continues on trastuzumab every 3 weeks. She tolerates that well and her recent echocardiogram showed a well-preserved ejection fraction.  She completed her radiation treatments. She still having some discomfort in the right breast. She was reassured that this is not unusual. She is also somewhat fatigued. It is very positive that she is planning to [facility] [employer] [facility] [employer] [facility]. I think this will make a significant difference to her functional status  She is now ready to start anti-estrogens. Today we had a long discussion regarding anti-estrogens. Specifically we discussed the difference between tamoxifen and anastrozole in detail. She understands that anastrozole and the aromatase inhibitors in general work by blocking estrogen production. Accordingly vaginal dryness, decrease in bone density, and of course hot flashes can result. The aromatase inhibitors can also negatively affect the cholesterol profile, although that is a minor effect. One out of 5 women on aromatase inhibitors we will feel "old and achy". This arthralgia/myalgia syndrome, which resembles fibromyalgia clinically, does resolve with stopping the medications. Accordingly this is not a reason to not try an aromatase inhibitor but it is a frequent reason to stop it (in other words 20% of women will not be able to tolerate these medications).  Tamoxifen on the other hand does not block estrogen production. It does not "take away a woman's  estrogen". It blocks the estrogen receptor in breast cells. Like anastrozole, it can also cause hot flashes. As opposed to anastrozole, tamoxifen has many estrogen-like effects. It is technically an estrogen receptor modulator. This means that in  some tissues tamoxifen works like estrogen-- for example it helps strengthen the bones. It tends to improve the cholesterol profile. It can cause thickening of the endometrial lining, and even endometrial polyps or rarely cancer of the uterus.(The risk of uterine cancer due to tamoxifen is one additional cancer per thousand women year). It can cause vaginal wetness or stickiness. It can cause blood clots through this estrogen-like effect--the risk of blood clots with tamoxifen is exactly the same as with birth control pills or hormone replacement.  Neither of these agents causes mood changes or weight gain, despite the popular belief that they can have these side effects. We have data from studies comparing either of these drugs with placebo, and in those cases the control group had the same amount of weight gain and depression as the group that took the drug.  After all this discussion, especially since Cheris is status post hysterectomy, she is very motivated to try the tamoxifen. She will start 08/09/2016. She will call with any problems that may develop from that but she will also see me in October to make sure she is not experiencing any unusual side effects  If she tolerates tamoxifen well the plan will be to continue that for a minimum of 2 years, at which point we could consider switching to an aromatase inhibitor, or a maximum of 10 years.  She knows to call for any issues that may develop before the next visit    Graciano Batson C, MD   08/01/2016 8:49 AM

## 2016-08-01 NOTE — Telephone Encounter (Signed)
appt made and avs to print in treatment room °

## 2016-08-01 NOTE — Patient Instructions (Signed)
[facility] Discharge Instructions for Patients Receiving Chemotherapy  Today you received the following chemotherapy agents: Herceptin   To help prevent nausea and vomiting after your treatment, we encourage you to take your nausea medication as directed.    If you develop nausea and vomiting that is not controlled by your nausea medication, call the clinic.   BELOW ARE SYMPTOMS THAT SHOULD BE REPORTED IMMEDIATELY:  *FEVER GREATER THAN 100.5 F  *CHILLS WITH OR WITHOUT FEVER  NAUSEA AND VOMITING THAT IS NOT CONTROLLED WITH YOUR NAUSEA MEDICATION  *UNUSUAL SHORTNESS OF BREATH  *UNUSUAL BRUISING OR BLEEDING  TENDERNESS IN MOUTH AND THROAT WITH OR WITHOUT PRESENCE OF ULCERS  *URINARY PROBLEMS  *BOWEL PROBLEMS  UNUSUAL RASH Items with * indicate a potential emergency and should be followed up as soon as possible.  Feel free to call the clinic you have any questions or concerns. The clinic phone number is [phone].  Please show the CHEMO ALERT CARD at check-in to the Emergency Department and triage nurse.   

## 2016-08-01 NOTE — Patient Instructions (Signed)

## 2016-08-03 ENCOUNTER — Encounter (HOSPITAL_COMMUNITY): Payer: Self-pay | Admitting: Emergency Medicine

## 2016-08-03 ENCOUNTER — Emergency Department (HOSPITAL_COMMUNITY)
Admission: EM | Admit: 2016-08-03 | Discharge: 2016-08-03 | Disposition: A | Discharge status: Home / Self Care | Payer: BLUE CROSS/BLUE SHIELD | Attending: Emergency Medicine | Admitting: Emergency Medicine

## 2016-08-03 ENCOUNTER — Emergency Department (HOSPITAL_COMMUNITY): Payer: BLUE CROSS/BLUE SHIELD

## 2016-08-03 DIAGNOSIS — Z79899 Other long term (current) drug therapy: Secondary | ICD-10-CM | POA: Diagnosis not present

## 2016-08-03 DIAGNOSIS — R519 Headache, unspecified: Secondary | ICD-10-CM

## 2016-08-03 DIAGNOSIS — R05 Cough: Secondary | ICD-10-CM | POA: Insufficient documentation

## 2016-08-03 DIAGNOSIS — R51 Headache: Secondary | ICD-10-CM | POA: Insufficient documentation

## 2016-08-03 DIAGNOSIS — Z853 Personal history of malignant neoplasm of breast: Secondary | ICD-10-CM | POA: Diagnosis not present

## 2016-08-03 DIAGNOSIS — R42 Dizziness and giddiness: Secondary | ICD-10-CM | POA: Diagnosis not present

## 2016-08-03 DIAGNOSIS — I10 Essential (primary) hypertension: Secondary | ICD-10-CM | POA: Diagnosis not present

## 2016-08-03 DIAGNOSIS — J029 Acute pharyngitis, unspecified: Secondary | ICD-10-CM | POA: Insufficient documentation

## 2016-08-03 HISTORY — DX: Malignant (primary) neoplasm, unspecified: C80.1

## 2016-08-03 LAB — CBC WITH DIFFERENTIAL/PLATELET
Basophils Absolute: 0 10*3/uL (ref 0.0–0.1)
Basophils Relative: 1 %
Eosinophils Absolute: 0.2 10*3/uL (ref 0.0–0.7)
Eosinophils Relative: 6 %
HCT: 36.3 % (ref 36.0–46.0)
Hemoglobin: 11.5 g/dL — ABNORMAL LOW (ref 12.0–15.0)
Lymphocytes Relative: 44 %
Lymphs Abs: 1.6 10*3/uL (ref 0.7–4.0)
MCH: 26.6 pg (ref 26.0–34.0)
MCHC: 31.7 g/dL (ref 30.0–36.0)
MCV: 83.8 fL (ref 78.0–100.0)
Monocytes Absolute: 0.4 10*3/uL (ref 0.1–1.0)
Monocytes Relative: 10 %
Neutro Abs: 1.4 10*3/uL — ABNORMAL LOW (ref 1.7–7.7)
Neutrophils Relative %: 39 %
Platelets: 234 10*3/uL (ref 150–400)
RBC: 4.33 MIL/uL (ref 3.87–5.11)
RDW: 14.2 % (ref 11.5–15.5)
WBC: 3.6 10*3/uL — ABNORMAL LOW (ref 4.0–10.5)

## 2016-08-03 LAB — RAPID STREP SCREEN (MED CTR MEBANE ONLY): Streptococcus, Group A Screen (Direct): NEGATIVE

## 2016-08-03 MED ORDER — KETOROLAC TROMETHAMINE 30 MG/ML IJ SOLN
30.0000 mg | Freq: Once | INTRAMUSCULAR | Status: AC
  Administered 2016-08-03: 30 mg via INTRAVENOUS
  Filled 2016-08-03: qty 1

## 2016-08-03 MED ORDER — SODIUM CHLORIDE 0.9 % IV BOLUS (SEPSIS)
1000.0000 mL | Freq: Once | INTRAVENOUS | Status: AC
  Administered 2016-08-03: 1000 mL via INTRAVENOUS

## 2016-08-03 MED ORDER — PROCHLORPERAZINE EDISYLATE 5 MG/ML IJ SOLN
5.0000 mg | Freq: Once | INTRAMUSCULAR | Status: AC
  Administered 2016-08-03: 5 mg via INTRAVENOUS
  Filled 2016-08-03: qty 2

## 2016-08-03 MED ORDER — BENZONATATE 100 MG PO CAPS: 100.0000 mg | ORAL_CAPSULE | Freq: Three times a day (TID) | ORAL | 0 refills | Status: DC

## 2016-08-03 MED ORDER — DIPHENHYDRAMINE HCL 50 MG/ML IJ SOLN
25.0000 mg | Freq: Once | INTRAMUSCULAR | Status: AC
  Administered 2016-08-03: 25 mg via INTRAVENOUS
  Filled 2016-08-03: qty 1

## 2016-08-03 MED ORDER — HEPARIN SOD (PORK) LOCK FLUSH 100 UNIT/ML IV SOLN
500.0000 [IU] | Freq: Once | INTRAVENOUS | Status: AC
  Administered 2016-08-03: 500 [IU]
  Filled 2016-08-03: qty 5

## 2016-08-03 MED ORDER — CETIRIZINE-PSEUDOEPHEDRINE ER 5-120 MG PO TB12: 1.0000 | ORAL_TABLET | Freq: Two times a day (BID) | ORAL | 0 refills | Status: DC

## 2016-08-03 MED ORDER — POLYMYXIN B-TRIMETHOPRIM 10000-0.1 UNIT/ML-% OP SOLN: 2.0000 [drp] | Freq: Four times a day (QID) | OPHTHALMIC | 0 refills | Status: AC

## 2016-08-03 NOTE — Discharge Instructions (Signed)
Medications: Polytrim, Tessalon, Zyrtec-D  Treatment: Use Polytrim drops as prescribed every 6 hours for 7 days. Take Tessalon every 8 hours as needed for your cough. Take Zyrtec-D twice daily for 7-10 days or until your symptoms have resolved. Make sure to drink plenty of water. You can treat your headache with over-the-counter ibuprofen or Tylenol.  Follow-up: Please follow-up with your doctor next week as needed if your symptoms are continuing without improvement. Please return to emergency department if you develop any new or worsening symptoms.

## 2016-08-03 NOTE — ED Triage Notes (Signed)
Pt has hx of breast cancer. Pt's last chemo treatment a couple of days ago. Pt c/o headache since the treatment. Pt c/o nausea with headache. Pt denies blurred vision/double vision, dizziness, lightheadedness. A&Ox4 and ambulatory. Pt c/o light sensitivity.

## 2016-08-03 NOTE — ED Notes (Signed)
Bed: [facility]PT2 Expected date:  Expected time:  Means of arrival:  Comments: 

## 2016-08-03 NOTE — ED Provider Notes (Signed)
[facility] DEPT Provider Note   CSN: [id number] Arrival date & time: 08/03/16  1051     History   Chief Complaint Chief Complaint  Patient presents with  . Migraine  . Cancer    HPI Vanessa [name] is a 55 y.o. female with history of breast cancer who is actively having chemotherapy treatments who presents with a headache. Patient describes her headache as behind her eyes, frontal and to the back hurts. Patient states she woke up with this throbbing headache on Friday morning and has had the pain since. Patient has had associated nausea and photophobia, as well as lightheadedness intermittently. Patient also reports an associated sore throat and cough with yellow/white sputum. Patient also reports waking up with her eyes feeling crusty yesterday and this morning. Patient had a chemotherapy appointment on Thursday. Patient does have a history of migraines and trigeminal neuralgia, however she has not experienced any symptoms for over 25 years. Patient has not had any headaches since her diagnosis of breast cancer in December. Patient has taken Zofran and Tylenol without significant relief. Patient denies any chest pain, shortness of breath, abdominal pain, vomiting, urinary symptoms.  HPI  Past Medical History:  Diagnosis Date  . Allergy   . Anemia   . Anxiety   . Arthritis   . Cancer ([place])    RIGHT BREAST  . Family history of breast cancer   . Family history of colon cancer   . Family history of thyroid cancer   . Hypertension    no meds now  . PONV (postoperative nausea and vomiting)   . Trigeminal neuralgia 05/2014    Patient Active Problem List   Diagnosis Date Noted  . Port catheter in place 03/28/2016  . 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 Obesity, morbid, BMI 40.0-49.9 ([place]) 01/15/2016  . Genetic testing 12/26/2015  . Family history of breast cancer   . Family history of colon cancer   . Family history of thyroid cancer   . Breast cancer of  upper-outer quadrant of right female breast ([place]) 11/22/2015  . Trigeminal neuralgia of left side of face 06/22/2014  . Essential hypertension 04/24/2014    Past Surgical History:  Procedure Laterality Date  . ABDOMINAL HYSTERECTOMY    . BREAST LUMPECTOMY WITH RADIOACTIVE SEED AND SENTINEL LYMPH NODE BIOPSY Right 12/29/2015   Procedure: RIGHT BREAST  RADIOACTIVE SEED LOCALIZATION LUMPECTOMY AND RIGHT SENTINEL LYMPH NODE MAPPING;  Surgeon: Erroll Luna, MD;  Location: [facility];  Service: General;  Laterality: Right;  . BREAST SURGERY     biopsy  . EYE SURGERY    . MENISCUS REPAIR     L knee  . PORTACATH PLACEMENT N/A 12/29/2015   Procedure: INSERTION PORT-A-CATH;  Surgeon: Erroll Luna, MD;  Location: [facility];  Service: General;  Laterality: N/A;    OB History    No data available       Home Medications    Prior to Admission medications   Medication Sig Start Date End Date Taking? Authorizing Provider  acetaminophen (TYLENOL) 325 MG tablet Take 650 mg by mouth every 6 (six) hours as needed for mild pain or moderate pain. Reported on 04/18/2016   Yes Historical Provider, MD  b complex vitamins tablet Take 1 tablet by mouth daily. Reported on 06/14/2016   Yes Historical Provider, MD  cetirizine (ZYRTEC) 10 MG tablet Take 10 mg by mouth daily as needed for allergies. Reported on 06/14/2016   Yes Historical Provider, MD  gabapentin (NEURONTIN) 300 MG capsule TAKE 1 CAPSULE (300 MG TOTAL) BY MOUTH AT BEDTIME. 07/05/16  Yes Chauncey Cruel, MD  gabapentin (NEURONTIN) 600 MG tablet TAKE 1 TABLET (600 MG TOTAL) BY MOUTH 3 (THREE) TIMES DAILY. 04/08/16  Yes Marcial Pacas, MD  lidocaine-prilocaine (EMLA) cream APPLY TOPICALLY ONCE DAILY AS NEEDED Patient taking differently: APPLY TOPICALLY ONCE DAILY AS NEEDED for port 07/05/16  Yes Chauncey Cruel, MD  Multiple Vitamins-Minerals (CENTRUM SILVER ADULT 50+ PO) Take 1 capsule by mouth daily. Reported on 03/14/2016    Yes Historical Provider, MD  ondansetron (ZOFRAN ODT) 4 MG disintegrating tablet Take 1 tablet (4 mg total) by mouth every 8 (eight) hours as needed for nausea or vomiting. 02/29/16  Yes Laurie Panda, NP  traMADol (ULTRAM) 50 MG tablet Take 1 tablet (50 mg total) by mouth 2 (two) times daily as needed. Patient taking differently: Take 50 mg by mouth 2 (two) times daily as needed for moderate pain.  05/27/16  Yes Eppie Gibson, MD  benzonatate (TESSALON) 100 MG capsule Take 1 capsule (100 mg total) by mouth every 8 (eight) hours. 08/03/16   Ariell Gunnels M Zavion Sleight, PA-C  cetirizine-pseudoephedrine (ZYRTEC-D) 5-120 MG tablet Take 1 tablet by mouth 2 (two) times daily. 08/03/16   Frederica Kuster, PA-C  trimethoprim-polymyxin b (POLYTRIM) ophthalmic solution Place 2 drops into both eyes every 6 (six) hours. 08/03/16 08/10/16  Frederica Kuster, PA-C    Family History Family History  Problem Relation Age of Onset  . Diabetes Mother   . Hypertension Mother   . Asthma Daughter   . Diabetes Sister   . Hypertension Sister   . Colon cancer Sister 35  . Thyroid cancer Sister 52  . Lung cancer Father   . Breast cancer Cousin     maternal first cousin  . Breast cancer Cousin 58    maternal first cousin - inflammatory    Social History Social History  Substance Use Topics  . Smoking status: Never Smoker  . Smokeless tobacco: Never Used  . Alcohol use Yes     Comment: social     Allergies   Penicillins; Hydrocodone; Meperidine; Amoxicillin-pot clavulanate; Cefaclor; Codeine; Darvocet [propoxyphene n-acetaminophen]; Demerol; Erythromycin; Flexeril [cyclobenzaprine hcl]; Percocet [oxycodone-acetaminophen]; and Sulfa antibiotics   Review of Systems Review of Systems  Constitutional: Negative for chills and fever.  HENT: Positive for sore throat. Negative for facial swelling.   Eyes: Positive for photophobia and discharge. Negative for visual disturbance.  Respiratory: Positive for cough. Negative for  shortness of breath.   Cardiovascular: Negative for chest pain.  Gastrointestinal: Negative for abdominal pain, nausea and vomiting.  Genitourinary: Negative for dysuria and frequency.  Musculoskeletal: Negative for back pain, neck pain and neck stiffness.  Skin: Negative for rash and wound.  Neurological: Positive for light-headedness and headaches.  Psychiatric/Behavioral: The patient is not nervous/anxious.      Physical Exam Updated Vital Signs BP 135/84 (BP Location: Left Arm)   Pulse 96   Temp 98.3 F (36.8 C) (Oral)   Resp 20   SpO2 98%   Physical Exam  Constitutional: She appears well-developed and well-nourished. No distress.  HENT:  Head: Normocephalic and atraumatic.    Mouth/Throat: Mucous membranes are normal. Oropharyngeal exudate, posterior oropharyngeal edema and posterior oropharyngeal erythema present. No tonsillar abscesses.  Mildl TTP to sinuses, as shown in image  Eyes: Conjunctivae and EOM are normal. Pupils are equal, round, and reactive to light. Right eye exhibits no discharge. Left eye exhibits  no discharge. No scleral icterus.  Neck: Normal range of motion. Neck supple. No thyromegaly present.  Cardiovascular: Normal rate, regular rhythm and normal heart sounds.  Exam reveals no gallop and no friction rub.   No murmur heard. Pulmonary/Chest: Effort normal and breath sounds normal. No stridor. No respiratory distress. She has no wheezes. She has no rales.  Abdominal: Soft. Bowel sounds are normal. She exhibits no distension. There is no tenderness. There is no rebound and no guarding.  Musculoskeletal: She exhibits no edema.  Lymphadenopathy:    She has no cervical adenopathy.  Neurological: She is alert. Coordination normal.  CN 3-12 intact; normal sensation throughout; 5/5 strength in all 4 extremities; equal bilateral grip strength; no ataxia on finger to nose  Skin: Skin is warm and dry. No rash noted. She is not diaphoretic. No pallor.    Psychiatric: She has a normal mood and affect.  Nursing note and vitals reviewed.    ED Treatments / Results  Labs (all labs ordered are listed, but only abnormal results are displayed) Labs Reviewed  CBC WITH DIFFERENTIAL/PLATELET - Abnormal; Notable for the following:       Result Value   WBC 3.6 (*)    Hemoglobin 11.5 (*)    Neutro Abs 1.4 (*)    All other components within normal limits  RAPID STREP SCREEN (NOT AT [facility])  CULTURE, GROUP A STREP [facility])    EKG  EKG Interpretation None       Radiology Ct Head Wo Contrast  Result Date: 08/03/2016 CLINICAL DATA:  History of breast cancer. Patient had last chemotherapy treatment a few days ago. Patient has had headaches and nausea since the treatment. EXAM: CT HEAD WITHOUT CONTRAST CT MAXILLOFACIAL WITHOUT CONTRAST TECHNIQUE: Multidetector CT imaging of the head and maxillofacial structures were performed using the standard protocol without intravenous contrast. Multiplanar CT image reconstructions of the maxillofacial structures were also generated. COMPARISON:  May 27 2014 FINDINGS: CT HEAD FINDINGS There is no midline shift, hydrocephalus, or mass. No acute hemorrhage or acute transcortical infarct is identified. The bony calvarium is intact. The visualized sinuses are clear. CT MAXILLOFACIAL FINDINGS There is no acute fracture or dislocation. The sinuses are clear. The bilateral ostiomeatal complexes are patent. The orbits are normal. IMPRESSION: Normal head CT. No evidence of sinusitis. Electronically Signed   By: Abelardo Diesel M.D.   On: 08/03/2016 12:56   Ct Maxillofacial Wo Contrast  Result Date: 08/03/2016 CLINICAL DATA:  History of breast cancer. Patient had last chemotherapy treatment a few days ago. Patient has had headaches and nausea since the treatment. EXAM: CT HEAD WITHOUT CONTRAST CT MAXILLOFACIAL WITHOUT CONTRAST TECHNIQUE: Multidetector CT imaging of the head and maxillofacial structures were performed using the  standard protocol without intravenous contrast. Multiplanar CT image reconstructions of the maxillofacial structures were also generated. COMPARISON:  May 27 2014 FINDINGS: CT HEAD FINDINGS There is no midline shift, hydrocephalus, or mass. No acute hemorrhage or acute transcortical infarct is identified. The bony calvarium is intact. The visualized sinuses are clear. CT MAXILLOFACIAL FINDINGS There is no acute fracture or dislocation. The sinuses are clear. The bilateral ostiomeatal complexes are patent. The orbits are normal. IMPRESSION: Normal head CT. No evidence of sinusitis. Electronically Signed   By: Abelardo Diesel M.D.   On: 08/03/2016 12:56    Procedures Procedures (including critical care time)  Medications Ordered in ED Medications  ketorolac (TORADOL) 30 MG/ML injection 30 mg (30 mg Intravenous Given 08/03/16 1216)  sodium chloride 0.9 %  bolus 1,000 mL (0 mLs Intravenous Stopped 08/03/16 1327)  prochlorperazine (COMPAZINE) injection 5 mg (5 mg Intravenous Given 08/03/16 1415)  diphenhydrAMINE (BENADRYL) injection 25 mg (25 mg Intravenous Given 08/03/16 1415)  heparin lock flush 100 unit/mL (500 Units Intracatheter Given 08/03/16 1627)     Initial Impression / Assessment and Plan / ED Course  I have reviewed the triage vital signs and the nursing notes.  Pertinent labs & imaging results that were available during my care of the patient were reviewed by me and considered in my medical decision making (see chart for details).  Clinical Course    Patient's headache improved with Toradol, however still present and seems to be coming back. Will order Compazine and Benadryl. Patient understands that we may not be able to completely alleviate patient's headache in ED. Plan to discharge with symptomatic treatment for viral URI.  Headache resolved with Toradol, Compazine, Benadryl. CT head and maxillofacial negative. No signs of sinusitis indicating antibiotic treatment at this time. I will  treat patient symptoms with Tessalon, Zyrtec-D, Polytrim. Patient advised she can take ibuprofen or Tylenol as prescribed over-the-counter. Patient advised to follow up with PCP in 2-3 days if symptoms are not improving. Return precautions discussed. Patient understands and agrees with plan. I discussed patient with Dr. Jeneen Rinks who guided the patient's management and agrees with plan. Patient vitals stable throughout ED course and discharged in satisfactory condition.  Final Clinical Impressions(s) / ED Diagnoses   Final diagnoses:  Acute nonintractable headache, unspecified headache type    New Prescriptions Discharge Medication List as of 08/03/2016  4:15 PM    START taking these medications   Details  benzonatate (TESSALON) 100 MG capsule Take 1 capsule (100 mg total) by mouth every 8 (eight) hours., Starting Sat 08/03/2016, Print    cetirizine-pseudoephedrine (ZYRTEC-D) 5-120 MG tablet Take 1 tablet by mouth 2 (two) times daily., Starting Sat 08/03/2016, Print    trimethoprim-polymyxin b (POLYTRIM) ophthalmic solution Place 2 drops into both eyes every 6 (six) hours., Starting Sat 08/03/2016, Until Sat 08/10/2016, Print      -   Frederica Kuster, PA-C 08/04/16 1506    Tanna Furry, MD 08/07/16 [phone]

## 2016-08-05 ENCOUNTER — Telehealth: Payer: Self-pay | Admitting: *Deleted

## 2016-08-05 MED ORDER — TAMOXIFEN CITRATE 20 MG PO TABS: 20.0000 mg | ORAL_TABLET | Freq: Every day | ORAL | 3 refills | Status: DC

## 2016-08-05 NOTE — Telephone Encounter (Signed)
This RN received via fax from HIM stating pt contacted on call service over the weekend and was advised to proceed to the ER.  Per pt contact- pt states " I am at work and feel 100% better ".  Per discussion pt inquired about her appointments -reviewed per [facility] noting MD request for Oct 2017 has not been scheduled at this time.  Analuz inquired about anti estrogen she is to start this Friday - per review of MD dictation noted per visit tamoxifen was decided upon - prescription sent to verified pharmacy per call.  At present pt has no further needs at this time.

## 2016-08-06 LAB — CULTURE, GROUP A STREP (THRC)

## 2016-08-06 NOTE — Progress Notes (Signed)
Photon Boost Complex Simulation and Treatment Planning Note 06-19-16 Diagnosis:  Breast cancer of upper-outer quadrant of right female breast ([place]) 174.4 C50.411   The patient's CT images from her free-breathing simulation were reviewed to plan her boost treatment to her right breast  lumpectomy cavity.  The boost to the lumpectomy cavity will be delivered with 3 photon fields using MLCs for custom blocks again heart and lungs. This constitutes 3 complex treatment devices. Isodose plan was reviewed and approved. 10 Gy in 5 fractions prescribed.  -----------------------------------  Eppie Gibson, MD

## 2016-08-09 ENCOUNTER — Ambulatory Visit
Admission: RE | Admit: 2016-08-09 | Discharge: 2016-08-09 | Disposition: A | Discharge status: Home / Self Care | Payer: BLUE CROSS/BLUE SHIELD | Source: Ambulatory Visit | Attending: Radiation Oncology | Admitting: Radiation Oncology

## 2016-08-09 ENCOUNTER — Encounter: Payer: Self-pay | Admitting: Radiation Oncology

## 2016-08-09 DIAGNOSIS — C50411 Malignant neoplasm of upper-outer quadrant of right female breast: Secondary | ICD-10-CM

## 2016-08-09 NOTE — Progress Notes (Signed)
Vanessa [name] is here for follow up of radiation completed 06/27/16 to her Right Breast. She denies pain, except an occasional "twinge" to her Right Breast. She still has fatigue, especially in the afternoon, but does have difficulty sleeping at night. Her Right Breast has healed but still has some hyperpigmentation. She is using vitamin E oil daily.   BP (!) 142/98   Pulse (!) 101   Temp 98.4 F (36.9 C)   Ht 5\' 4"  (1.626 m)   Wt 297 lb 8 oz (134.9 kg)   SpO2 97% Comment: room air  BMI 51.07 kg/m    Wt Readings from Last 3 Encounters:  08/09/16 297 lb 8 oz (134.9 kg)  08/01/16 294 lb 1.6 oz (133.4 kg)  06/24/16 292 lb 1.6 oz (132.5 kg)

## 2016-08-09 NOTE — Progress Notes (Signed)
Radiation Oncology         [phone] ________________________________  Name: Vanessa [name] MRN: [id number]  Date: 08/09/2016  DOB: 02/04/1961  Follow-Up Visit Note  Outpatient  CC: Aretta Nip, MD  Erroll Luna, MD  Diagnosis and Prior Radiotherapy:    ICD-9-CM ICD-10-CM   1. 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 The Right breast tangents were treated to 50.4 Gy in 28 fractions at 1.8 Gy per fraction. 2. The Right breast was boosted to 10 Gy in 5 fractions at 2 Gy per fraction.  Narrative:  The patient returns today for routine follow-up. Some insomnia. She denies pain, except an occasional "twinge" to her right breast. She still has fatigue, especially in the afternoon, and does have difficulty sleeping at night. Her right breast has healed but still has some hyperpigmentation. She is using vitamin E oil daily. She is enrolled in the [facility] program.  ALLERGIES:  is allergic to penicillins; hydrocodone; meperidine; amoxicillin-pot clavulanate; cefaclor; codeine; darvocet [propoxyphene n-acetaminophen]; demerol; erythromycin; flexeril [cyclobenzaprine hcl]; percocet [oxycodone-acetaminophen]; and sulfa antibiotics.  Meds: Current Outpatient Prescriptions  Medication Sig Dispense Refill  . acetaminophen (TYLENOL) 325 MG tablet Take 650 mg by mouth every 6 (six) hours as needed for mild pain or moderate pain. Reported on 04/18/2016    . b complex vitamins tablet Take 1 tablet by mouth daily. Reported on 06/14/2016    . cetirizine (ZYRTEC) 10 MG tablet Take 10 mg by mouth daily as needed for allergies. Reported on 06/14/2016    . gabapentin (NEURONTIN) 300 MG capsule TAKE 1 CAPSULE (300 MG TOTAL) BY MOUTH AT BEDTIME. 90 capsule 0  . gabapentin (NEURONTIN) 600 MG tablet TAKE 1 TABLET (600 MG TOTAL) BY MOUTH 3 (THREE)  TIMES DAILY. 90 tablet 6  . lidocaine-prilocaine (EMLA) cream APPLY TOPICALLY ONCE DAILY AS NEEDED (Patient taking differently: APPLY TOPICALLY ONCE DAILY AS NEEDED for port) 30 g 1  . Multiple Vitamins-Minerals (CENTRUM SILVER ADULT 50+ PO) Take 1 capsule by mouth daily. Reported on 03/14/2016    . tamoxifen (NOLVADEX) 20 MG tablet Take 1 tablet (20 mg total) by mouth daily. 30 tablet 3  . benzonatate (TESSALON) 100 MG capsule Take 1 capsule (100 mg total) by mouth every 8 (eight) hours. (Patient not taking: Reported on 08/09/2016) 21 capsule 0  . cetirizine-pseudoephedrine (ZYRTEC-D) 5-120 MG tablet Take 1 tablet by mouth 2 (two) times daily. (Patient not taking: Reported on 08/09/2016) 20 tablet 0  . ondansetron (ZOFRAN ODT) 4 MG disintegrating tablet Take 1 tablet (4 mg total) by mouth every 8 (eight) hours as needed for nausea or vomiting. (Patient not taking: Reported on 08/09/2016) 20 tablet 1  . traMADol (ULTRAM) 50 MG tablet Take 1 tablet (50 mg total) by mouth 2 (two) times daily as needed. (Patient not taking: Reported on 08/09/2016) 50 tablet 0  . trimethoprim-polymyxin b (POLYTRIM) ophthalmic solution Place 2 drops into both eyes every 6 (six) hours. (Patient not taking: Reported on 08/09/2016) 10 mL 0   No current facility-administered medications for this encounter.     Physical Findings: The patient is in no acute distress. Patient is alert and oriented.  height is 5\' 4"  (1.626 m) and weight is 297 lb 8 oz (134.9 kg). Her temperature is 98.4 F (36.9 C). Her blood pressure is 142/98 (abnormal) and her pulse  is 101 (abnormal). Her oxygen saturation is 97%. .    Mild residual hyperpigmentation over the right breast. The skin has healed well.  Lab Findings: Lab Results  Component Value Date   WBC 3.6 (L) 08/03/2016   HGB 11.5 (L) 08/03/2016   HCT 36.3 08/03/2016   MCV 83.8 08/03/2016   PLT 234 08/03/2016    Radiographic Findings: Ct Head Wo Contrast  Result Date: 08/03/2016 CLINICAL  DATA:  History of breast cancer. Patient had last chemotherapy treatment a few days ago. Patient has had headaches and nausea since the treatment. EXAM: CT HEAD WITHOUT CONTRAST CT MAXILLOFACIAL WITHOUT CONTRAST TECHNIQUE: Multidetector CT imaging of the head and maxillofacial structures were performed using the standard protocol without intravenous contrast. Multiplanar CT image reconstructions of the maxillofacial structures were also generated. COMPARISON:  May 27 2014 FINDINGS: CT HEAD FINDINGS There is no midline shift, hydrocephalus, or mass. No acute hemorrhage or acute transcortical infarct is identified. The bony calvarium is intact. The visualized sinuses are clear. CT MAXILLOFACIAL FINDINGS There is no acute fracture or dislocation. The sinuses are clear. The bilateral ostiomeatal complexes are patent. The orbits are normal. IMPRESSION: Normal head CT. No evidence of sinusitis. Electronically Signed   By: Abelardo Diesel M.D.   On: 08/03/2016 12:56   Ct Maxillofacial Wo Contrast  Result Date: 08/03/2016 CLINICAL DATA:  History of breast cancer. Patient had last chemotherapy treatment a few days ago. Patient has had headaches and nausea since the treatment. EXAM: CT HEAD WITHOUT CONTRAST CT MAXILLOFACIAL WITHOUT CONTRAST TECHNIQUE: Multidetector CT imaging of the head and maxillofacial structures were performed using the standard protocol without intravenous contrast. Multiplanar CT image reconstructions of the maxillofacial structures were also generated. COMPARISON:  May 27 2014 FINDINGS: CT HEAD FINDINGS There is no midline shift, hydrocephalus, or mass. No acute hemorrhage or acute transcortical infarct is identified. The bony calvarium is intact. The visualized sinuses are clear. CT MAXILLOFACIAL FINDINGS There is no acute fracture or dislocation. The sinuses are clear. The bilateral ostiomeatal complexes are patent. The orbits are normal. IMPRESSION: Normal head CT. No evidence of sinusitis.  Electronically Signed   By: Abelardo Diesel M.D.   On: 08/03/2016 12:56   Impression/Plan: healing well.  Some sleeping issues: I gave the patient tips on sleeping; such as only using her bed to sleep, don't use her phone before sleeping, wear comfortable PJs while sleeping, etc.  I encouraged her to continue with yearly mammography and followup with medical oncology. I will see her back on an as-needed basis. I have encouraged her to call if she has any issues or concerns in the future. I wished her the very best. Starting anti estrogen tx today.   _____________________________________   Eppie Gibson, MD  This document serves as a record of services personally performed by Eppie Gibson, MD. It was created on her behalf by Darcus Austin, a trained medical scribe. The creation of this record is based on the scribe's personal observations and the provider's statements to them. This document has been checked and approved by the attending provider.

## 2016-08-15 ENCOUNTER — Telehealth: Payer: Self-pay | Admitting: *Deleted

## 2016-08-15 MED ORDER — CIPROFLOXACIN HCL 500 MG PO TABS: 500.0000 mg | ORAL_TABLET | Freq: Two times a day (BID) | ORAL | 0 refills | Status: DC

## 2016-08-15 NOTE — Telephone Encounter (Signed)
Received call from pt stating that she is urinating more freq than normal & feeling a twinge at start of urination.  She reports no other symptoms but noticed this yest.  She can be reached at [phone].  Message to Dr Magrinat/Pod RN

## 2016-08-15 NOTE — Telephone Encounter (Signed)
S/w Dr Jana Hakim, rx cipro 500 bid for 3 days Informed pt.

## 2016-08-20 ENCOUNTER — Encounter (HOSPITAL_COMMUNITY): Payer: Self-pay

## 2016-08-21 ENCOUNTER — Other Ambulatory Visit: Payer: Self-pay | Admitting: *Deleted

## 2016-08-21 DIAGNOSIS — J029 Acute pharyngitis, unspecified: Secondary | ICD-10-CM | POA: Diagnosis not present

## 2016-08-21 DIAGNOSIS — C50411 Malignant neoplasm of upper-outer quadrant of right female breast: Secondary | ICD-10-CM

## 2016-08-21 DIAGNOSIS — J069 Acute upper respiratory infection, unspecified: Secondary | ICD-10-CM | POA: Diagnosis not present

## 2016-08-22 ENCOUNTER — Other Ambulatory Visit: Payer: BLUE CROSS/BLUE SHIELD

## 2016-08-22 ENCOUNTER — Ambulatory Visit: Payer: Self-pay

## 2016-08-22 ENCOUNTER — Ambulatory Visit (HOSPITAL_BASED_OUTPATIENT_CLINIC_OR_DEPARTMENT_OTHER): Payer: BLUE CROSS/BLUE SHIELD

## 2016-08-22 VITALS — BP 145/47 | HR 85 | Temp 98.3°F | Resp 16

## 2016-08-22 DIAGNOSIS — C50411 Malignant neoplasm of upper-outer quadrant of right female breast: Secondary | ICD-10-CM | POA: Diagnosis not present

## 2016-08-22 DIAGNOSIS — Z5112 Encounter for antineoplastic immunotherapy: Secondary | ICD-10-CM | POA: Diagnosis not present

## 2016-08-22 LAB — COMPREHENSIVE METABOLIC PANEL
ALK PHOS: 102 U/L (ref 40–150)
ALT: 14 U/L (ref 0–55)
ANION GAP: 8 meq/L (ref 3–11)
AST: 18 U/L (ref 5–34)
Albumin: 3.2 g/dL — ABNORMAL LOW (ref 3.5–5.0)
BILIRUBIN TOTAL: 0.37 mg/dL (ref 0.20–1.20)
BUN: 10 mg/dL (ref 7.0–26.0)
CALCIUM: 9 mg/dL (ref 8.4–10.4)
CO2: 26 mEq/L (ref 22–29)
CREATININE: 0.7 mg/dL (ref 0.6–1.1)
Chloride: 107 mEq/L (ref 98–109)
Glucose: 101 mg/dl (ref 70–140)
Potassium: 3.6 mEq/L (ref 3.5–5.1)
Sodium: 142 mEq/L (ref 136–145)
TOTAL PROTEIN: 7.3 g/dL (ref 6.4–8.3)

## 2016-08-22 LAB — CBC WITH DIFFERENTIAL/PLATELET
BASO%: 0.7 % (ref 0.0–2.0)
Basophils Absolute: 0 10*3/uL (ref 0.0–0.1)
EOS ABS: 0.3 10*3/uL (ref 0.0–0.5)
EOS%: 6.1 % (ref 0.0–7.0)
HCT: 37.4 % (ref 34.8–46.6)
HGB: 12.1 g/dL (ref 11.6–15.9)
LYMPH#: 1.2 10*3/uL (ref 0.9–3.3)
LYMPH%: 25.7 % (ref 14.0–49.7)
MCH: 26.4 pg (ref 25.1–34.0)
MCHC: 32.4 g/dL (ref 31.5–36.0)
MCV: 81.7 fL (ref 79.5–101.0)
MONO#: 0.3 10*3/uL (ref 0.1–0.9)
MONO%: 6.7 % (ref 0.0–14.0)
NEUT#: 2.8 10*3/uL (ref 1.5–6.5)
NEUT%: 60.8 % (ref 38.4–76.8)
PLATELETS: 215 10*3/uL (ref 145–400)
RBC: 4.58 10*6/uL (ref 3.70–5.45)
RDW: 14.1 % (ref 11.2–14.5)
WBC: 4.6 10*3/uL (ref 3.9–10.3)

## 2016-08-22 MED ORDER — ACETAMINOPHEN 325 MG PO TABS
650.0000 mg | ORAL_TABLET | Freq: Once | ORAL | Status: AC
  Administered 2016-08-22: 650 mg via ORAL

## 2016-08-22 MED ORDER — SODIUM CHLORIDE 0.9% FLUSH
10.0000 mL | INTRAVENOUS | Status: DC | PRN
Start: 2016-08-22 — End: 2016-08-22
  Administered 2016-08-22: 10 mL
  Filled 2016-08-22: qty 10

## 2016-08-22 MED ORDER — TRASTUZUMAB CHEMO 150 MG IV SOLR
6.0000 mg/kg | Freq: Once | INTRAVENOUS | Status: AC
  Administered 2016-08-22: 840 mg via INTRAVENOUS
  Filled 2016-08-22: qty 4.34

## 2016-08-22 MED ORDER — ACETAMINOPHEN 325 MG PO TABS
ORAL_TABLET | ORAL | Status: AC
  Filled 2016-08-22: qty 2

## 2016-08-22 MED ORDER — DIPHENHYDRAMINE HCL 25 MG PO CAPS
ORAL_CAPSULE | ORAL | Status: AC
  Filled 2016-08-22: qty 2

## 2016-08-22 MED ORDER — DIPHENHYDRAMINE HCL 25 MG PO CAPS
50.0000 mg | ORAL_CAPSULE | Freq: Once | ORAL | Status: AC
  Administered 2016-08-22: 50 mg via ORAL

## 2016-08-22 MED ORDER — HEPARIN SOD (PORK) LOCK FLUSH 100 UNIT/ML IV SOLN
500.0000 [IU] | Freq: Once | INTRAVENOUS | Status: AC | PRN
  Administered 2016-08-22: 500 [IU]
  Filled 2016-08-22: qty 5

## 2016-08-22 MED ORDER — SODIUM CHLORIDE 0.9 % IV SOLN
Freq: Once | INTRAVENOUS | Status: AC
  Administered 2016-08-22: 10:00:00 via INTRAVENOUS

## 2016-08-22 NOTE — Patient Instructions (Signed)
[facility] Discharge Instructions for Patients Receiving Chemotherapy  Today you received the following chemotherapy agents: Herceptin   To help prevent nausea and vomiting after your treatment, we encourage you to take your nausea medication as directed.    If you develop nausea and vomiting that is not controlled by your nausea medication, call the clinic.   BELOW ARE SYMPTOMS THAT SHOULD BE REPORTED IMMEDIATELY:  *FEVER GREATER THAN 100.5 F  *CHILLS WITH OR WITHOUT FEVER  NAUSEA AND VOMITING THAT IS NOT CONTROLLED WITH YOUR NAUSEA MEDICATION  *UNUSUAL SHORTNESS OF BREATH  *UNUSUAL BRUISING OR BLEEDING  TENDERNESS IN MOUTH AND THROAT WITH OR WITHOUT PRESENCE OF ULCERS  *URINARY PROBLEMS  *BOWEL PROBLEMS  UNUSUAL RASH Items with * indicate a potential emergency and should be followed up as soon as possible.  Feel free to call the clinic you have any questions or concerns. The clinic phone number is [phone].  Please show the CHEMO ALERT CARD at check-in to the Emergency Department and triage nurse.   

## 2016-08-23 DIAGNOSIS — R05 Cough: Secondary | ICD-10-CM | POA: Diagnosis not present

## 2016-08-27 ENCOUNTER — Other Ambulatory Visit (HOSPITAL_COMMUNITY): Payer: Self-pay | Admitting: *Deleted

## 2016-08-27 ENCOUNTER — Encounter (HOSPITAL_COMMUNITY): Payer: Self-pay

## 2016-08-27 ENCOUNTER — Ambulatory Visit (HOSPITAL_COMMUNITY)
Admission: RE | Admit: 2016-08-27 | Discharge: 2016-08-27 | Disposition: A | Discharge status: Home / Self Care | Payer: BLUE CROSS/BLUE SHIELD | Source: Ambulatory Visit | Attending: Cardiology | Admitting: Cardiology

## 2016-08-27 ENCOUNTER — Ambulatory Visit (HOSPITAL_BASED_OUTPATIENT_CLINIC_OR_DEPARTMENT_OTHER)
Admission: RE | Admit: 2016-08-27 | Discharge: 2016-08-27 | Disposition: A | Discharge status: Home / Self Care | Payer: BLUE CROSS/BLUE SHIELD | Source: Ambulatory Visit | Attending: Cardiology | Admitting: Cardiology

## 2016-08-27 VITALS — BP 140/84 | HR 83 | Wt 294.5 lb

## 2016-08-27 DIAGNOSIS — R05 Cough: Secondary | ICD-10-CM | POA: Diagnosis not present

## 2016-08-27 DIAGNOSIS — J209 Acute bronchitis, unspecified: Secondary | ICD-10-CM | POA: Diagnosis not present

## 2016-08-27 DIAGNOSIS — C50411 Malignant neoplasm of upper-outer quadrant of right female breast: Secondary | ICD-10-CM

## 2016-08-27 DIAGNOSIS — I10 Essential (primary) hypertension: Secondary | ICD-10-CM | POA: Insufficient documentation

## 2016-08-27 DIAGNOSIS — G500 Trigeminal neuralgia: Secondary | ICD-10-CM | POA: Insufficient documentation

## 2016-08-27 DIAGNOSIS — R059 Cough, unspecified: Secondary | ICD-10-CM

## 2016-08-27 MED ORDER — ALBUTEROL SULFATE HFA 108 (90 BASE) MCG/ACT IN AERS: 2.0000 | INHALATION_SPRAY | Freq: Four times a day (QID) | RESPIRATORY_TRACT | 0 refills | Status: DC | PRN

## 2016-08-27 MED ORDER — DOXYCYCLINE HYCLATE 100 MG PO TABS: 100.0000 mg | ORAL_TABLET | Freq: Two times a day (BID) | ORAL | 0 refills | Status: AC

## 2016-08-27 NOTE — Patient Instructions (Signed)
Start Doxycycline 100 mg Twice daily, we will let you know if this needs to be 7 or 10 days  Start Albuterol Inhaler as needed  Chest x-ray today  Your physician recommends that you schedule a follow-up appointment in: 3 months with echocardiogram

## 2016-08-27 NOTE — Progress Notes (Signed)
Patient ID: Vanessa [name], female   DOB: 16-Jan-1961, 55 y.o.   MRN: [id number]  Oncologist: Dr. Jana Hakim  55 yo with breast cancer presents for cardiology evaluation prior to starting Herceptin.  She was diagnosed with triple positive breast cancer in 12/16.  Plans are lumpectomy/lymph node dissection on 12/30/15 followed by abraxane/Herceptin x 12 weeks then Herceptin alone to complete 1 year.  She completed radiation in 7/17.   She continues with Herceptin.  No exertional dyspnea.   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  Taking all medications.  For the last 7-10 days, she has felt congested and has been wheezing.  She has had greenish sputum.  Fever for several days last week.  Does not seem to be getting better.    Labs (12/16): K 3.7, creatinine 0.8  PMH: 1. HTN: Not currently on meds.  2. Trigeminal neuralgia 3. TAH 4. Breast cancer: Diagnosed 12/16, ER+/PR+/HER2+.   - Echo (1/17) with EF 60-65%, lateral s' 11, GLS -23.1%, mild LVH, normal RV size and systolic function.  - Echo (4/17) with EF 60%, GLS -18.1%, normal RV size and systolic function.  - Echo (7/17) with EF 60-65%, GLS -16.8%.  - Echo (9/17) with EF 60-65%, mild LVh, GLS -14.8% (but do not think strain is accurate on this study), lateral s' 12.7 cm/sec.   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   FH: Father with lung cancer, grandmother died from MI.  HTN, DM.   ROS: All systems reviewed and negative except as per HPI.   Current Outpatient Prescriptions  Medication Sig Dispense Refill  . acetaminophen (TYLENOL) 325 MG tablet Take 650 mg by mouth every 6 (six) hours as needed for mild pain or moderate pain. Reported on 04/18/2016    . b complex vitamins tablet Take 1 tablet by mouth daily. Reported on 06/14/2016    . benzonatate (TESSALON) 100 MG capsule Take 1 capsule (100 mg total) by mouth every 8 (eight) hours. 21 capsule 0  . cetirizine (ZYRTEC)  10 MG tablet Take 10 mg by mouth daily as needed for allergies. Reported on 06/14/2016    . gabapentin (NEURONTIN) 300 MG capsule TAKE 1 CAPSULE (300 MG TOTAL) BY MOUTH AT BEDTIME. 90 capsule 0  . gabapentin (NEURONTIN) 600 MG tablet TAKE 1 TABLET (600 MG TOTAL) BY MOUTH 3 (THREE) TIMES DAILY. 90 tablet 6  . lidocaine-prilocaine (EMLA) cream APPLY TOPICALLY ONCE DAILY AS NEEDED (Patient taking differently: APPLY TOPICALLY ONCE DAILY AS NEEDED for port) 30 g 1  . Multiple Vitamins-Minerals (CENTRUM SILVER ADULT 50+ PO) Take 1 capsule by mouth daily. Reported on 03/14/2016    . tamoxifen (NOLVADEX) 20 MG tablet Take 1 tablet (20 mg total) by mouth daily. 30 tablet 3  . albuterol (PROVENTIL HFA;VENTOLIN HFA) 108 (90 Base) MCG/ACT inhaler Inhale 2 puffs into the lungs every 6 (six) hours as needed for wheezing or shortness of breath. 1 Inhaler 0  . doxycycline (VIBRA-TABS) 100 MG tablet Take 1 tablet (100 mg total) by mouth 2 (two) times daily. 20 tablet 0  . ondansetron (ZOFRAN ODT) 4 MG disintegrating tablet Take 1 tablet (4 mg total) by mouth every 8 (eight) hours as needed for nausea or vomiting. (Patient not taking: Reported on 08/27/2016) 20 tablet 1  . traMADol (ULTRAM) 50 MG tablet Take 1 tablet (50 mg total) by mouth 2 (two) times daily as needed. (Patient not taking: Reported on  08/27/2016) 50 tablet 0   No current facility-administered medications for this encounter.    BP 140/84   Pulse 83   Wt 294 lb 8 oz (133.6 kg)   SpO2 99%   BMI 50.55 kg/m  General: NAD Neck: No JVD, no thyromegaly or thyroid nodule.  Lungs: Bilateral rhonchi and end expiratory wheezes.  CV: Nondisplaced PMI.  Heart regular S1/S2, no S3/S4, no murmur.  No peripheral edema.  No carotid bruit.  Normal pedal pulses.  Abdomen: Soft, nontender, no hepatosplenomegaly, no distention.  Skin: Intact without lesions or rashes.  Neurologic: Alert and oriented x 3.  Psych: Normal affect. Extremities: No clubbing or cyanosis.    HEENT: Normal.   Assessment/Plan: 1. Breast cancer:  She continues on Herceptin therapy.  I reviewed today's echo which showed good EF and stable lateral s'.  Strain is lower, but images were very difficult for strain with poor endocardial definition.  I think she is doing ok, will repeat echo with followup in 3 months.  She will continue Herceptin to 1/18.   2. HTN: BP controlled.  3. Acute bronchitis: Given fever/congestion and rhonchi/wheezes on exam, I got a CXR.  No PNA.  Suspect acute bronchitis.  Given persistent symptoms, I will give her a 7 day course of doxycycline and will give her a prescription for albuterol MDI to use as needed.   Follow up in 3 months with an ECHO   Loralie Champagne 08/27/2016

## 2016-08-27 NOTE — Progress Notes (Signed)
  Echocardiogram 2D Echocardiogram limited has been performed.  Tresa Res 08/27/2016, 9:52 AM

## 2016-09-02 DIAGNOSIS — Z0001 Encounter for general adult medical examination with abnormal findings: Secondary | ICD-10-CM | POA: Diagnosis not present

## 2016-09-02 DIAGNOSIS — Z23 Encounter for immunization: Secondary | ICD-10-CM | POA: Diagnosis not present

## 2016-09-03 ENCOUNTER — Other Ambulatory Visit: Payer: Self-pay | Admitting: *Deleted

## 2016-09-11 ENCOUNTER — Other Ambulatory Visit: Payer: Self-pay | Admitting: *Deleted

## 2016-09-11 DIAGNOSIS — C50411 Malignant neoplasm of upper-outer quadrant of right female breast: Secondary | ICD-10-CM

## 2016-09-12 ENCOUNTER — Ambulatory Visit (HOSPITAL_BASED_OUTPATIENT_CLINIC_OR_DEPARTMENT_OTHER): Payer: BLUE CROSS/BLUE SHIELD

## 2016-09-12 ENCOUNTER — Other Ambulatory Visit (HOSPITAL_BASED_OUTPATIENT_CLINIC_OR_DEPARTMENT_OTHER): Payer: BLUE CROSS/BLUE SHIELD

## 2016-09-12 ENCOUNTER — Ambulatory Visit: Payer: BLUE CROSS/BLUE SHIELD

## 2016-09-12 VITALS — BP 141/67 | HR 79 | Temp 98.7°F | Resp 18

## 2016-09-12 DIAGNOSIS — Z5112 Encounter for antineoplastic immunotherapy: Secondary | ICD-10-CM

## 2016-09-12 DIAGNOSIS — C50411 Malignant neoplasm of upper-outer quadrant of right female breast: Secondary | ICD-10-CM

## 2016-09-12 DIAGNOSIS — Z95828 Presence of other vascular implants and grafts: Secondary | ICD-10-CM

## 2016-09-12 LAB — COMPREHENSIVE METABOLIC PANEL
ALBUMIN: 3.3 g/dL — AB (ref 3.5–5.0)
ALK PHOS: 87 U/L (ref 40–150)
ALT: 27 U/L (ref 0–55)
ANION GAP: 7 meq/L (ref 3–11)
AST: 24 U/L (ref 5–34)
BILIRUBIN TOTAL: 0.41 mg/dL (ref 0.20–1.20)
BUN: 11.9 mg/dL (ref 7.0–26.0)
CALCIUM: 9 mg/dL (ref 8.4–10.4)
CO2: 26 mEq/L (ref 22–29)
CREATININE: 0.7 mg/dL (ref 0.6–1.1)
Chloride: 109 mEq/L (ref 98–109)
Glucose: 95 mg/dl (ref 70–140)
Potassium: 3.7 mEq/L (ref 3.5–5.1)
Sodium: 142 mEq/L (ref 136–145)
TOTAL PROTEIN: 7.5 g/dL (ref 6.4–8.3)

## 2016-09-12 LAB — CBC WITH DIFFERENTIAL/PLATELET
BASO%: 0.8 % (ref 0.0–2.0)
Basophils Absolute: 0 10*3/uL (ref 0.0–0.1)
EOS ABS: 0.3 10*3/uL (ref 0.0–0.5)
EOS%: 7 % (ref 0.0–7.0)
HEMATOCRIT: 37.9 % (ref 34.8–46.6)
HEMOGLOBIN: 12.3 g/dL (ref 11.6–15.9)
LYMPH#: 1.4 10*3/uL (ref 0.9–3.3)
LYMPH%: 39.8 % (ref 14.0–49.7)
MCH: 26.6 pg (ref 25.1–34.0)
MCHC: 32.5 g/dL (ref 31.5–36.0)
MCV: 82 fL (ref 79.5–101.0)
MONO#: 0.3 10*3/uL (ref 0.1–0.9)
MONO%: 7.5 % (ref 0.0–14.0)
NEUT%: 44.9 % (ref 38.4–76.8)
NEUTROS ABS: 1.6 10*3/uL (ref 1.5–6.5)
PLATELETS: 242 10*3/uL (ref 145–400)
RBC: 4.62 10*6/uL (ref 3.70–5.45)
RDW: 14.3 % (ref 11.2–14.5)
WBC: 3.6 10*3/uL — AB (ref 3.9–10.3)

## 2016-09-12 MED ORDER — SODIUM CHLORIDE 0.9 % IV SOLN
6.0000 mg/kg | Freq: Once | INTRAVENOUS | Status: AC
  Administered 2016-09-12: 840 mg via INTRAVENOUS
  Filled 2016-09-12: qty 40

## 2016-09-12 MED ORDER — ACETAMINOPHEN 325 MG PO TABS
ORAL_TABLET | ORAL | Status: AC
  Filled 2016-09-12: qty 2

## 2016-09-12 MED ORDER — HEPARIN SOD (PORK) LOCK FLUSH 100 UNIT/ML IV SOLN
500.0000 [IU] | Freq: Once | INTRAVENOUS | Status: AC | PRN
  Administered 2016-09-12: 500 [IU]
  Filled 2016-09-12: qty 5

## 2016-09-12 MED ORDER — SODIUM CHLORIDE 0.9 % IJ SOLN
10.0000 mL | INTRAMUSCULAR | Status: DC | PRN
  Administered 2016-09-12: 10 mL via INTRAVENOUS
  Filled 2016-09-12: qty 10

## 2016-09-12 MED ORDER — DIPHENHYDRAMINE HCL 25 MG PO CAPS
ORAL_CAPSULE | ORAL | Status: AC
  Filled 2016-09-12: qty 2

## 2016-09-12 MED ORDER — SODIUM CHLORIDE 0.9 % IV SOLN
Freq: Once | INTRAVENOUS | Status: AC
  Administered 2016-09-12: 10:00:00 via INTRAVENOUS

## 2016-09-12 MED ORDER — ACETAMINOPHEN 325 MG PO TABS
650.0000 mg | ORAL_TABLET | Freq: Once | ORAL | Status: AC
  Administered 2016-09-12: 650 mg via ORAL

## 2016-09-12 MED ORDER — DIPHENHYDRAMINE HCL 25 MG PO CAPS
50.0000 mg | ORAL_CAPSULE | Freq: Once | ORAL | Status: AC
  Administered 2016-09-12: 50 mg via ORAL

## 2016-09-12 MED ORDER — SODIUM CHLORIDE 0.9% FLUSH
10.0000 mL | INTRAVENOUS | Status: DC | PRN
  Administered 2016-09-12: 10 mL
  Filled 2016-09-12: qty 10

## 2016-09-12 NOTE — Patient Instructions (Signed)
[facility] Discharge Instructions for Patients Receiving Chemotherapy  Today you received the following chemotherapy agents: Herceptin   To help prevent nausea and vomiting after your treatment, we encourage you to take your nausea medication as directed.    If you develop nausea and vomiting that is not controlled by your nausea medication, call the clinic.   BELOW ARE SYMPTOMS THAT SHOULD BE REPORTED IMMEDIATELY:  *FEVER GREATER THAN 100.5 F  *CHILLS WITH OR WITHOUT FEVER  NAUSEA AND VOMITING THAT IS NOT CONTROLLED WITH YOUR NAUSEA MEDICATION  *UNUSUAL SHORTNESS OF BREATH  *UNUSUAL BRUISING OR BLEEDING  TENDERNESS IN MOUTH AND THROAT WITH OR WITHOUT PRESENCE OF ULCERS  *URINARY PROBLEMS  *BOWEL PROBLEMS  UNUSUAL RASH Items with * indicate a potential emergency and should be followed up as soon as possible.  Feel free to call the clinic you have any questions or concerns. The clinic phone number is [phone].  Please show the CHEMO ALERT CARD at check-in to the Emergency Department and triage nurse.   

## 2016-09-18 DIAGNOSIS — H6093 Unspecified otitis externa, bilateral: Secondary | ICD-10-CM | POA: Diagnosis not present

## 2016-09-18 DIAGNOSIS — J029 Acute pharyngitis, unspecified: Secondary | ICD-10-CM | POA: Diagnosis not present

## 2016-09-25 ENCOUNTER — Other Ambulatory Visit: Payer: Self-pay | Admitting: Oncology

## 2016-10-02 ENCOUNTER — Other Ambulatory Visit: Payer: Self-pay | Admitting: *Deleted

## 2016-10-02 DIAGNOSIS — C50411 Malignant neoplasm of upper-outer quadrant of right female breast: Secondary | ICD-10-CM

## 2016-10-03 ENCOUNTER — Other Ambulatory Visit (HOSPITAL_BASED_OUTPATIENT_CLINIC_OR_DEPARTMENT_OTHER): Payer: BLUE CROSS/BLUE SHIELD

## 2016-10-03 ENCOUNTER — Encounter: Payer: Self-pay | Admitting: *Deleted

## 2016-10-03 ENCOUNTER — Ambulatory Visit (HOSPITAL_BASED_OUTPATIENT_CLINIC_OR_DEPARTMENT_OTHER): Payer: BLUE CROSS/BLUE SHIELD

## 2016-10-03 ENCOUNTER — Ambulatory Visit: Payer: BLUE CROSS/BLUE SHIELD

## 2016-10-03 ENCOUNTER — Ambulatory Visit (HOSPITAL_BASED_OUTPATIENT_CLINIC_OR_DEPARTMENT_OTHER): Payer: BLUE CROSS/BLUE SHIELD | Admitting: Oncology

## 2016-10-03 VITALS — BP 147/73 | HR 84 | Temp 98.2°F | Resp 18 | Ht 64.0 in | Wt 297.2 lb

## 2016-10-03 DIAGNOSIS — Z79810 Long term (current) use of selective estrogen receptor modulators (SERMs): Secondary | ICD-10-CM | POA: Diagnosis not present

## 2016-10-03 DIAGNOSIS — C50411 Malignant neoplasm of upper-outer quadrant of right female breast: Secondary | ICD-10-CM | POA: Diagnosis not present

## 2016-10-03 DIAGNOSIS — Z5112 Encounter for antineoplastic immunotherapy: Secondary | ICD-10-CM

## 2016-10-03 DIAGNOSIS — Z95828 Presence of other vascular implants and grafts: Secondary | ICD-10-CM

## 2016-10-03 DIAGNOSIS — Z170 Estrogen receptor positive status [ER+]: Secondary | ICD-10-CM | POA: Diagnosis not present

## 2016-10-03 LAB — COMPREHENSIVE METABOLIC PANEL
ALT: 13 U/L (ref 0–55)
ANION GAP: 7 meq/L (ref 3–11)
AST: 19 U/L (ref 5–34)
Albumin: 3.2 g/dL — ABNORMAL LOW (ref 3.5–5.0)
Alkaline Phosphatase: 90 U/L (ref 40–150)
BILIRUBIN TOTAL: 0.41 mg/dL (ref 0.20–1.20)
BUN: 15.9 mg/dL (ref 7.0–26.0)
CALCIUM: 8.7 mg/dL (ref 8.4–10.4)
CHLORIDE: 110 meq/L — AB (ref 98–109)
CO2: 26 meq/L (ref 22–29)
Creatinine: 0.7 mg/dL (ref 0.6–1.1)
Glucose: 96 mg/dl (ref 70–140)
Potassium: 3.7 mEq/L (ref 3.5–5.1)
Sodium: 143 mEq/L (ref 136–145)
TOTAL PROTEIN: 7.3 g/dL (ref 6.4–8.3)

## 2016-10-03 LAB — CBC WITH DIFFERENTIAL/PLATELET
BASO%: 0.8 % (ref 0.0–2.0)
BASOS ABS: 0 10*3/uL (ref 0.0–0.1)
EOS ABS: 0.3 10*3/uL (ref 0.0–0.5)
EOS%: 7.5 % — ABNORMAL HIGH (ref 0.0–7.0)
HCT: 36.4 % (ref 34.8–46.6)
HGB: 11.8 g/dL (ref 11.6–15.9)
LYMPH%: 38.2 % (ref 14.0–49.7)
MCH: 26.8 pg (ref 25.1–34.0)
MCHC: 32.4 g/dL (ref 31.5–36.0)
MCV: 82.5 fL (ref 79.5–101.0)
MONO#: 0.3 10*3/uL (ref 0.1–0.9)
MONO%: 7.5 % (ref 0.0–14.0)
NEUT#: 1.7 10*3/uL (ref 1.5–6.5)
NEUT%: 46 % (ref 38.4–76.8)
PLATELETS: 189 10*3/uL (ref 145–400)
RBC: 4.41 10*6/uL (ref 3.70–5.45)
RDW: 15 % — ABNORMAL HIGH (ref 11.2–14.5)
WBC: 3.7 10*3/uL — ABNORMAL LOW (ref 3.9–10.3)
lymph#: 1.4 10*3/uL (ref 0.9–3.3)

## 2016-10-03 MED ORDER — SODIUM CHLORIDE 0.9 % IV SOLN
Freq: Once | INTRAVENOUS | Status: AC
  Administered 2016-10-03: 11:00:00 via INTRAVENOUS

## 2016-10-03 MED ORDER — ACETAMINOPHEN 325 MG PO TABS
ORAL_TABLET | ORAL | Status: AC
  Filled 2016-10-03: qty 2

## 2016-10-03 MED ORDER — LIDOCAINE-PRILOCAINE 2.5-2.5 % EX CREA: TOPICAL_CREAM | CUTANEOUS | 1 refills | Status: DC

## 2016-10-03 MED ORDER — ACETAMINOPHEN 325 MG PO TABS
650.0000 mg | ORAL_TABLET | Freq: Once | ORAL | Status: AC
  Administered 2016-10-03: 650 mg via ORAL

## 2016-10-03 MED ORDER — SODIUM CHLORIDE 0.9 % IV SOLN
6.0000 mg/kg | Freq: Once | INTRAVENOUS | Status: AC
  Administered 2016-10-03: 840 mg via INTRAVENOUS
  Filled 2016-10-03: qty 40

## 2016-10-03 MED ORDER — HEPARIN SOD (PORK) LOCK FLUSH 100 UNIT/ML IV SOLN
500.0000 [IU] | Freq: Once | INTRAVENOUS | Status: AC | PRN
  Administered 2016-10-03: 500 [IU]
  Filled 2016-10-03: qty 5

## 2016-10-03 MED ORDER — DIPHENHYDRAMINE HCL 25 MG PO CAPS
ORAL_CAPSULE | ORAL | Status: AC
  Filled 2016-10-03: qty 2

## 2016-10-03 MED ORDER — SODIUM CHLORIDE 0.9 % IJ SOLN
10.0000 mL | INTRAMUSCULAR | Status: DC | PRN
  Administered 2016-10-03: 10 mL via INTRAVENOUS
  Filled 2016-10-03: qty 10

## 2016-10-03 MED ORDER — SODIUM CHLORIDE 0.9% FLUSH
10.0000 mL | INTRAVENOUS | Status: DC | PRN
  Administered 2016-10-03: 10 mL
  Filled 2016-10-03: qty 10

## 2016-10-03 MED ORDER — DIPHENHYDRAMINE HCL 25 MG PO CAPS
50.0000 mg | ORAL_CAPSULE | Freq: Once | ORAL | Status: AC
  Administered 2016-10-03: 50 mg via ORAL

## 2016-10-03 NOTE — Progress Notes (Signed)
[facility]  [phone] [phone]     ID: Vanessa [name] DOB: 01-12-1961  MR#: [id number]  QQP#:[id number]  Patient Care Team: Vanessa Nip, MD as PCP - General (Family Medicine) Vanessa Luna, MD as Consulting Physician (General Surgery) Chauncey Cruel, MD as Consulting Physician (Oncology) Vanessa Pacas, MD as Consulting Physician (Neurology) Vanessa [name], DPM as Consulting Physician (Podiatry) Vanessa Salina, MD as Consulting Physician (Obstetrics and Gynecology) Vanessa Gibson, MD as Attending Physician (Radiation Oncology) Vanessa Dresser, MD as Consulting Physician (Cardiology) PCP: Vanessa Nip, MD OTHER MD:  CHIEF COMPLAINT: Triple positive breast cancer  CURRENT TREATMENT: trastuzumab, tamoxifen   BREAST CANCER HISTORY: From the original intake note:  Vanessa [name] had screening mammography at Dr. cousins office on 11/06/2015 suggesting a change in the right breast. She was scheduled for right diagnostic mammography and tomosynthesis with ultrasonography at the [facility] 11/13/2015. The breast density was category B. In the upper outer quadrant of the right breast there was a small irregular mass with suspicious internal calcifications. By exam this was small, mobile, firm, and located at the 10:00 position 7 cm from the nipple. Ultrasound of the right breast confirmed an irregularly marginated hypoechoic mass measuring 1.1 cm. The right axilla was sonographically benign.  Biopsy of the right breast mass in question 11/15/2015 showed ([id number]) an invasive ductal carcinoma, grade 2, estrogen receptor 100% positive, progesterone receptor 2% positive, both with strong staining intensity, with an MIB-1 of 70%, and HER-2 amplification, the signals ratio being 4.92 and the number per cell 17.45.  The patient's subsequent history is as detailed below.  INTERVAL HISTORY: Vanessa [name] returns today for follow-up of her estrogen  receptor positive  breast cancer. She started tamoxifen in September and is tolerating it well. She has had very minimal nighttime hot flashes which do not wake her up (she notices the back of her neck is a little wet when she wakes up for other reasons). She has not had any vaginal discharge. Currently she obtains a drug at no cost.  REVIEW OF SYSTEMS: Vanessa [name] had an upper respiratory infection which required 2 rounds of doxycycline to clear. She had left year involvement and she still has a little bit of discomfort in her left neck area. Because of this she missed some of the [employer] sessions. She describes herself is moderately fatigued. She has discomfort associated with the port depending on what side she lies on at night. 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 She has knee pain bilaterally. A detailed review of systems today was otherwise stable  PAST MEDICAL HISTORY: Past Medical History:  Diagnosis Date  . Allergy   . Anemia   . Anxiety   . Arthritis   . Cancer ([place])    RIGHT BREAST  . Family history of breast cancer   . Family history of colon cancer   . Family history of thyroid cancer   . Hypertension    no meds now  . PONV (postoperative nausea and vomiting)   . Trigeminal neuralgia 05/2014    PAST SURGICAL HISTORY: Past Surgical History:  Procedure Laterality Date  . ABDOMINAL HYSTERECTOMY    . BREAST LUMPECTOMY WITH RADIOACTIVE SEED AND SENTINEL LYMPH NODE BIOPSY Right 12/29/2015   Procedure: RIGHT BREAST  RADIOACTIVE SEED LOCALIZATION LUMPECTOMY AND RIGHT SENTINEL LYMPH NODE MAPPING;  Surgeon: Vanessa Luna, MD;  Location: [facility];  Service: General;  Laterality: Right;  . BREAST SURGERY  biopsy  . EYE SURGERY    . MENISCUS REPAIR     L knee  . PORTACATH PLACEMENT N/A 12/29/2015   Procedure: INSERTION PORT-A-CATH;  Surgeon: Vanessa Luna, MD;  Location: [facility];  Service: General;  Laterality: N/A;    FAMILY  HISTORY Family History  Problem Relation Age of Onset  . Diabetes Mother   . Hypertension Mother   . Asthma Daughter   . Diabetes Sister   . Hypertension Sister   . Colon cancer Sister 63  . Thyroid cancer Sister 28  . Lung cancer Father   . Breast cancer Cousin     maternal first cousin  . Breast cancer Cousin 38    maternal first cousin - inflammatory   the patient's father died from lung cancer at the age of 17, in the setting of tobacco abuse. The patient's mother is living, at age 53. The patient had 2 brothers, 3 sisters. One sister was diagnosed with colon cancer in her 9s and later thyroid cancer. She died from metastatic disease. One brother died with pulmonary problems. On the maternal side a cousin was diagnosed with inflammatory breast cancer at the age of 33. A second cousin on the maternal side was diagnosed with breast cancer in her early 31s. There is no history of ovarian cancer in the family.  GYNECOLOGIC HISTORY:  No LMP recorded. Patient is postmenopausal. Menarche age 68, first live birth age 59, the patient is [place] P2. She underwent simple hysterectomy with bilateral salpingectomy 06/28/2010, with benign pathology([id number]). She still has her ovaries in place. 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 She describes herself is single. At home she lives with her daughter Vanessa [name], who teaches theater at [employer] middle [employer] and is currently studying towards a Masters in counseling; and her son Vanessa [name], who is a professional basketball player in the international circuit (currently with the [employer]). The patient has no grandchildren. She attends a Tour manager    ADVANCED DIRECTIVES: Not in place. At the initial clinic visit 10/03/2016 the patient was given the appropriate forms to complete and notarize at her  discretion.  HEALTH MAINTENANCE: Social History  Substance Use Topics  . Smoking status: Never Smoker  . Smokeless tobacco: Never Used  . Alcohol use Yes     Comment: social     Colonoscopy: 2016/[facility]  PAP:  Bone density:  Lipid panel:  Allergies  Allergen Reactions  . Penicillins Anaphylaxis    Has patient had a PCN reaction causing immediate rash, facial/tongue/throat swelling, SOB or lightheadedness with hypotension: Yes Has patient had a PCN reaction causing severe rash involving mucus membranes or skin necrosis: Yes Has patient had a PCN reaction that required hospitalization Unknown Has patient had a PCN reaction occurring within the last 10 years: No If all of the above answers are "NO", then may proceed with Cephalosporin use.   Marland Kitchen Hydrocodone Itching    Can take with Benadryl   . Meperidine Swelling  . Amoxicillin-Pot Clavulanate Rash    Has patient had a PCN reaction causing immediate rash, facial/tongue/throat swelling, SOB or lightheadedness with hypotension: Yes Has patient had a PCN reaction causing severe rash involving mucus membranes or skin necrosis: Yes Has patient had a PCN reaction that required hospitalization Unknown Has patient had a PCN reaction occurring within the last 10 years: No If all of the above answers  are "NO", then may proceed with Cephalosporin use.   . Cefaclor Rash  . Codeine Rash  . Darvocet [Propoxyphene N-Acetaminophen] Rash  . Demerol Rash  . Erythromycin Rash  . Flexeril [Cyclobenzaprine Hcl] Rash  . Percocet [Oxycodone-Acetaminophen] Itching and Rash    Red streaks come up on skin  . Sulfa Antibiotics Rash    Current Outpatient Prescriptions  Medication Sig Dispense Refill  . diclofenac (CATAFLAM) 50 MG tablet Take 50 mg by mouth 3 (three) times daily.    Marland Kitchen acetaminophen (TYLENOL) 325 MG tablet Take 650 mg by mouth every 6 (six) hours as needed for mild pain or moderate pain. Reported on 04/18/2016    . b complex vitamins  tablet Take 1 tablet by mouth daily. Reported on 06/14/2016    . cetirizine (ZYRTEC) 10 MG tablet Take 10 mg by mouth daily as needed for allergies. Reported on 06/14/2016    . gabapentin (NEURONTIN) 300 MG capsule TAKE 1 CAPSULE (300 MG TOTAL) BY MOUTH AT BEDTIME. 90 capsule 0  . gabapentin (NEURONTIN) 600 MG tablet TAKE 1 TABLET (600 MG TOTAL) BY MOUTH 3 (THREE) TIMES DAILY. 90 tablet 6  . lidocaine-prilocaine (EMLA) cream APPLY TOPICALLY ONCE DAILY AS NEEDED 30 g 1  . Multiple Vitamins-Minerals (CENTRUM SILVER ADULT 50+ PO) Take 1 capsule by mouth daily. Reported on 03/14/2016    . ondansetron (ZOFRAN ODT) 4 MG disintegrating tablet Take 1 tablet (4 mg total) by mouth every 8 (eight) hours as needed for nausea or vomiting. (Patient not taking: Reported on 08/27/2016) 20 tablet 1  . tamoxifen (NOLVADEX) 20 MG tablet Take 1 tablet (20 mg total) by mouth daily. 30 tablet 3  . traMADol (ULTRAM) 50 MG tablet Take 1 tablet (50 mg total) by mouth 2 (two) times daily as needed. (Patient not taking: Reported on 08/27/2016) 50 tablet 0   No current facility-administered medications for this visit.     OBJECTIVE: Middle-aged African-American woman In no acute distress Vitals:   10/03/16 0952  BP: (!) 147/73  Pulse: 84  Resp: 18  Temp: 98.2 F (36.8 C)     Body mass index is 51.01 kg/m.    ECOG FS:1 - Symptomatic but completely ambulatory  Sclerae unicteric, pupils round and equal Oropharynx clear and moist-- no thrush or other lesions No cervical or supraclavicular adenopathy Lungs no rales or rhonchi Heart regular rate and rhythm Abd soft, obese, nontender, positive bowel sounds MSK no focal spinal tenderness, no upper extremity lymphedema Neuro: nonfocal, well oriented, appropriate affect Breasts: Deferred     LAB RESULTS:  CMP     Component Value Date/Time   NA 143 10/03/2016 0904   K 3.7 10/03/2016 0904   CL 106 12/20/2015 1120   CO2 26 10/03/2016 0904   GLUCOSE 96 10/03/2016 0904    BUN 15.9 10/03/2016 0904   CREATININE 0.7 10/03/2016 0904   CALCIUM 8.7 10/03/2016 0904   PROT 7.3 10/03/2016 0904   ALBUMIN 3.2 (L) 10/03/2016 0904   AST 19 10/03/2016 0904   ALT 13 10/03/2016 0904   ALKPHOS 90 10/03/2016 0904   BILITOT 0.41 10/03/2016 0904   GFRNONAA >60 12/20/2015 1120   GFRAA >60 12/20/2015 1120    INo results found for: SPEP, UPEP  Lab Results  Component Value Date   WBC 3.7 (L) 10/03/2016   NEUTROABS 1.7 10/03/2016   HGB 11.8 10/03/2016   HCT 36.4 10/03/2016   MCV 82.5 10/03/2016   PLT 189 10/03/2016      Chemistry  Component Value Date/Time   NA 143 10/03/2016 0904   K 3.7 10/03/2016 0904   CL 106 12/20/2015 1120   CO2 26 10/03/2016 0904   BUN 15.9 10/03/2016 0904   CREATININE 0.7 10/03/2016 0904      Component Value Date/Time   CALCIUM 8.7 10/03/2016 0904   ALKPHOS 90 10/03/2016 0904   AST 19 10/03/2016 0904   ALT 13 10/03/2016 0904   BILITOT 0.41 10/03/2016 0904      No results found for: LABCA2  No components found for: LABCA125  No results for input(s): INR in the last 168 hours.  Urinalysis No results found for: COLORURINE, APPEARANCEUR, LABSPEC, PHURINE, GLUCOSEU, HGBUR, BILIRUBINUR, KETONESUR, PROTEINUR, UROBILINOGEN, NITRITE, LEUKOCYTESUR  STUDIES: No results found.  ASSESSMENT: 55 y.o. Vanessa [name] woman status post right breast upper outer quadrant biopsy 11/15/2015 for a clinical T1c N0, stage IA invasive ductal carcinoma, grade 2, estrogen and progesterone receptor positive, HER-2 amplified, with an MIB-1 of 70%  (1) Genetics testing 12/22/2015 through the Hereditary Gene Panel offered by [facility] found no deleterious mutations in  APC, ATM, AXIN2, BARD1, BMPR1A, BRCA1, BRCA2, BRIP1, CDH1, CDKN2A, CHEK2, DICER1, EPCAM, GREM1, KIT, MEN1, MLH1, MSH2, MSH6, MUTYH, NBN, NF1, PALB2, PDGFRA, PMS2, POLD1, POLE, PTEN, RAD50, RAD51C, RAD51D, SDHA, SDHB, SDHC, SDHD, SMAD4, SMARCA4. STK11, TP53, TSC1, TSC2, and VHL.  (2) right  lumpectomy with sentinel lymph node sampling 12/29/2015 showed a pT1c pN0, stage IA invasive ductal carcinoma, grade 3, with negative though close margins (71m to DCIS)  (3) adjuvant chemotherapy with weekly Abraxane 11 given 01/25/2016 through 03/28/2016 with trastuzumab every 3 weeks started 01/25/2016  (a) 12th Abraxane cycle omitted because of peripheral neuropathy  (4) trastuzumab to be continued to total one year (through January 2018)  (a) echo 08/27/2016 showed an ejection fraction of 60-65 %  (5) adjuvant radiation completed 06/27/2016  (6) tamoxifen started 08/09/2016  (a) s/p hysterectomy July 2011  PLAN: KEmirais now approaching a year out from definitive surgery for her breast cancer with no evidence of disease recurrence. This is favorable.  She is tolerating the tamoxifen with essentially no side effects that she is aware of. The plan is going to be to continue this for a minimum of 5 years.  We discussed continuing Herceptin through January, but for insurance reasons and given the fact that she will pretty much have had just about a year if she continues right through December her last dose will be the last week in December. She is delighted at being able to stop this medication and getting her port out, which will be scheduled for early January. Of course she still has one more echo planned for mid December  January also is when she sees her gynecologist and has had routine mammography. Accordingly she will see me 3 months later, in March or April.  She knows to call for any problems that may develop before her next visit here.   MChauncey Cruel MD   10/03/2016 10:18 AM this is Dr. MJana Hakimcan help you obtain

## 2016-10-03 NOTE — Patient Instructions (Signed)
[facility] Discharge Instructions for Patients Receiving Chemotherapy  Today you received the following chemotherapy agents:  Herceptin  To help prevent nausea and vomiting after your treatment, we encourage you to take your nausea medication as prescribed.   If you develop nausea and vomiting that is not controlled by your nausea medication, call the clinic.   BELOW ARE SYMPTOMS THAT SHOULD BE REPORTED IMMEDIATELY:  *FEVER GREATER THAN 100.5 F  *CHILLS WITH OR WITHOUT FEVER  NAUSEA AND VOMITING THAT IS NOT CONTROLLED WITH YOUR NAUSEA MEDICATION  *UNUSUAL SHORTNESS OF BREATH  *UNUSUAL BRUISING OR BLEEDING  TENDERNESS IN MOUTH AND THROAT WITH OR WITHOUT PRESENCE OF ULCERS  *URINARY PROBLEMS  *BOWEL PROBLEMS  UNUSUAL RASH Items with * indicate a potential emergency and should be followed up as soon as possible.  Feel free to call the clinic you have any questions or concerns. The clinic phone number is [phone].  Please show the CHEMO ALERT CARD at check-in to the Emergency Department and triage nurse.   

## 2016-10-07 ENCOUNTER — Other Ambulatory Visit: Payer: Self-pay

## 2016-10-07 MED ORDER — LIDOCAINE-PRILOCAINE 2.5-2.5 % EX CREA: TOPICAL_CREAM | CUTANEOUS | 1 refills | Status: DC

## 2016-10-18 ENCOUNTER — Other Ambulatory Visit: Payer: Self-pay | Admitting: Neurology

## 2016-10-23 ENCOUNTER — Other Ambulatory Visit: Payer: Self-pay | Admitting: *Deleted

## 2016-10-23 DIAGNOSIS — C50411 Malignant neoplasm of upper-outer quadrant of right female breast: Secondary | ICD-10-CM

## 2016-10-24 ENCOUNTER — Telehealth: Payer: Self-pay | Admitting: Oncology

## 2016-10-24 ENCOUNTER — Other Ambulatory Visit (HOSPITAL_BASED_OUTPATIENT_CLINIC_OR_DEPARTMENT_OTHER): Payer: BLUE CROSS/BLUE SHIELD

## 2016-10-24 ENCOUNTER — Ambulatory Visit (HOSPITAL_BASED_OUTPATIENT_CLINIC_OR_DEPARTMENT_OTHER): Payer: BLUE CROSS/BLUE SHIELD

## 2016-10-24 VITALS — BP 147/78 | HR 80 | Temp 98.1°F | Resp 18

## 2016-10-24 DIAGNOSIS — Z5112 Encounter for antineoplastic immunotherapy: Secondary | ICD-10-CM

## 2016-10-24 DIAGNOSIS — C50411 Malignant neoplasm of upper-outer quadrant of right female breast: Secondary | ICD-10-CM

## 2016-10-24 LAB — CBC WITH DIFFERENTIAL/PLATELET
BASO%: 1.2 % (ref 0.0–2.0)
Basophils Absolute: 0 10*3/uL (ref 0.0–0.1)
EOS ABS: 0.2 10*3/uL (ref 0.0–0.5)
EOS%: 6.4 % (ref 0.0–7.0)
HEMATOCRIT: 36 % (ref 34.8–46.6)
HEMOGLOBIN: 11.8 g/dL (ref 11.6–15.9)
LYMPH#: 1.4 10*3/uL (ref 0.9–3.3)
LYMPH%: 43.6 % (ref 14.0–49.7)
MCH: 27.2 pg (ref 25.1–34.0)
MCHC: 32.8 g/dL (ref 31.5–36.0)
MCV: 82.9 fL (ref 79.5–101.0)
MONO#: 0.3 10*3/uL (ref 0.1–0.9)
MONO%: 7.6 % (ref 0.0–14.0)
NEUT%: 41.2 % (ref 38.4–76.8)
NEUTROS ABS: 1.4 10*3/uL — AB (ref 1.5–6.5)
NRBC: 0 % (ref 0–0)
PLATELETS: 158 10*3/uL (ref 145–400)
RBC: 4.34 10*6/uL (ref 3.70–5.45)
RDW: 15.1 % — AB (ref 11.2–14.5)
WBC: 3.3 10*3/uL — AB (ref 3.9–10.3)

## 2016-10-24 LAB — COMPREHENSIVE METABOLIC PANEL
ALBUMIN: 3.1 g/dL — AB (ref 3.5–5.0)
ALT: 14 U/L (ref 0–55)
ANION GAP: 8 meq/L (ref 3–11)
AST: 20 U/L (ref 5–34)
Alkaline Phosphatase: 85 U/L (ref 40–150)
BILIRUBIN TOTAL: 0.33 mg/dL (ref 0.20–1.20)
BUN: 11.4 mg/dL (ref 7.0–26.0)
CO2: 24 meq/L (ref 22–29)
CREATININE: 0.7 mg/dL (ref 0.6–1.1)
Calcium: 8.7 mg/dL (ref 8.4–10.4)
Chloride: 109 mEq/L (ref 98–109)
EGFR: >90 mL/min/{1.73_m2} (ref >90)
Glucose: 94 mg/dl (ref 70–140)
Potassium: 3.4 mEq/L — ABNORMAL LOW (ref 3.5–5.1)
Sodium: 141 mEq/L (ref 136–145)
TOTAL PROTEIN: 7 g/dL (ref 6.4–8.3)

## 2016-10-24 MED ORDER — ACETAMINOPHEN 325 MG PO TABS
650.0000 mg | ORAL_TABLET | Freq: Once | ORAL | Status: AC
  Administered 2016-10-24: 650 mg via ORAL

## 2016-10-24 MED ORDER — SODIUM CHLORIDE 0.9% FLUSH
10.0000 mL | INTRAVENOUS | Status: DC | PRN
  Administered 2016-10-24: 10 mL
  Filled 2016-10-24: qty 10

## 2016-10-24 MED ORDER — HEPARIN SOD (PORK) LOCK FLUSH 100 UNIT/ML IV SOLN
500.0000 [IU] | Freq: Once | INTRAVENOUS | Status: AC | PRN
  Administered 2016-10-24: 500 [IU]
  Filled 2016-10-24: qty 5

## 2016-10-24 MED ORDER — DIPHENHYDRAMINE HCL 25 MG PO CAPS
50.0000 mg | ORAL_CAPSULE | Freq: Once | ORAL | Status: AC
  Administered 2016-10-24: 50 mg via ORAL

## 2016-10-24 MED ORDER — TRASTUZUMAB CHEMO 150 MG IV SOLR
6.0000 mg/kg | Freq: Once | INTRAVENOUS | Status: AC
  Administered 2016-10-24: 840 mg via INTRAVENOUS
  Filled 2016-10-24: qty 40

## 2016-10-24 MED ORDER — ACETAMINOPHEN 325 MG PO TABS
ORAL_TABLET | ORAL | Status: AC
  Filled 2016-10-24: qty 2

## 2016-10-24 MED ORDER — DIPHENHYDRAMINE HCL 25 MG PO CAPS
ORAL_CAPSULE | ORAL | Status: AC
  Filled 2016-10-24: qty 2

## 2016-10-24 MED ORDER — SODIUM CHLORIDE 0.9 % IV SOLN
Freq: Once | INTRAVENOUS | Status: AC
  Administered 2016-10-24: 10:00:00 via INTRAVENOUS

## 2016-10-24 NOTE — Telephone Encounter (Signed)
Patient stopped by after tx to have last tx for 12/28 added.   Per 10/26 los patient should have tx 10/26 and q3w thru dec and return to see GM in March.   Patient was on schedule for next tx 12/14 and lab/fu March. Moved 12/14 lab/tx due to tx q3w and next tx is 12/7 as listed in care plan. Also added 12/28 last tx in care plan and gave patient new schedule for December. Patient aware and has [facility] lab/fu.

## 2016-10-24 NOTE — Patient Instructions (Signed)
[facility] Discharge Instructions for Patients Receiving Chemotherapy  Today you received the following chemotherapy agents herceptin   To help prevent nausea and vomiting after your treatment, we encourage you to take your nausea medication as directed   If you develop nausea and vomiting that is not controlled by your nausea medication, call the clinic.   BELOW ARE SYMPTOMS THAT SHOULD BE REPORTED IMMEDIATELY:  *FEVER GREATER THAN 100.5 F  *CHILLS WITH OR WITHOUT FEVER  NAUSEA AND VOMITING THAT IS NOT CONTROLLED WITH YOUR NAUSEA MEDICATION  *UNUSUAL SHORTNESS OF BREATH  *UNUSUAL BRUISING OR BLEEDING  TENDERNESS IN MOUTH AND THROAT WITH OR WITHOUT PRESENCE OF ULCERS  *URINARY PROBLEMS  *BOWEL PROBLEMS  UNUSUAL RASH Items with * indicate a potential emergency and should be followed up as soon as possible.  Feel free to call the clinic you have any questions or concerns. The clinic phone number is [phone].  

## 2016-11-13 ENCOUNTER — Other Ambulatory Visit: Payer: Self-pay | Admitting: *Deleted

## 2016-11-13 DIAGNOSIS — C50411 Malignant neoplasm of upper-outer quadrant of right female breast: Secondary | ICD-10-CM

## 2016-11-14 ENCOUNTER — Ambulatory Visit: Payer: BLUE CROSS/BLUE SHIELD

## 2016-11-14 ENCOUNTER — Encounter: Payer: Self-pay | Admitting: *Deleted

## 2016-11-14 ENCOUNTER — Ambulatory Visit (HOSPITAL_BASED_OUTPATIENT_CLINIC_OR_DEPARTMENT_OTHER): Payer: BLUE CROSS/BLUE SHIELD

## 2016-11-14 ENCOUNTER — Other Ambulatory Visit (HOSPITAL_BASED_OUTPATIENT_CLINIC_OR_DEPARTMENT_OTHER): Payer: BLUE CROSS/BLUE SHIELD

## 2016-11-14 ENCOUNTER — Other Ambulatory Visit: Payer: Self-pay | Admitting: Oncology

## 2016-11-14 VITALS — BP 148/81 | HR 99 | Temp 97.0°F | Resp 18

## 2016-11-14 DIAGNOSIS — Z5112 Encounter for antineoplastic immunotherapy: Secondary | ICD-10-CM | POA: Diagnosis not present

## 2016-11-14 DIAGNOSIS — Z95828 Presence of other vascular implants and grafts: Secondary | ICD-10-CM

## 2016-11-14 DIAGNOSIS — C50411 Malignant neoplasm of upper-outer quadrant of right female breast: Secondary | ICD-10-CM

## 2016-11-14 LAB — CBC WITH DIFFERENTIAL/PLATELET
BASO%: 0.5 % (ref 0.0–2.0)
BASOS ABS: 0 10*3/uL (ref 0.0–0.1)
EOS ABS: 0.2 10*3/uL (ref 0.0–0.5)
EOS%: 5.3 % (ref 0.0–7.0)
HCT: 38.2 % (ref 34.8–46.6)
HGB: 12.5 g/dL (ref 11.6–15.9)
LYMPH%: 41.2 % (ref 14.0–49.7)
MCH: 27.5 pg (ref 25.1–34.0)
MCHC: 32.7 g/dL (ref 31.5–36.0)
MCV: 84 fL (ref 79.5–101.0)
MONO#: 0.2 10*3/uL (ref 0.1–0.9)
MONO%: 5.8 % (ref 0.0–14.0)
NEUT#: 1.9 10*3/uL (ref 1.5–6.5)
NEUT%: 47.2 % (ref 38.4–76.8)
Platelets: 191 10*3/uL (ref 145–400)
RBC: 4.55 10*6/uL (ref 3.70–5.45)
RDW: 14.7 % — ABNORMAL HIGH (ref 11.2–14.5)
WBC: 4 10*3/uL (ref 3.9–10.3)
lymph#: 1.6 10*3/uL (ref 0.9–3.3)

## 2016-11-14 LAB — COMPREHENSIVE METABOLIC PANEL
ALK PHOS: 89 U/L (ref 40–150)
ALT: 15 U/L (ref 0–55)
AST: 18 U/L (ref 5–34)
Albumin: 3.2 g/dL — ABNORMAL LOW (ref 3.5–5.0)
Anion Gap: 9 mEq/L (ref 3–11)
BUN: 10.3 mg/dL (ref 7.0–26.0)
CHLORIDE: 108 meq/L (ref 98–109)
CO2: 27 meq/L (ref 22–29)
Calcium: 9 mg/dL (ref 8.4–10.4)
Creatinine: 0.7 mg/dL (ref 0.6–1.1)
GLUCOSE: 119 mg/dL (ref 70–140)
POTASSIUM: 3.5 meq/L (ref 3.5–5.1)
SODIUM: 144 meq/L (ref 136–145)
Total Bilirubin: 0.3 mg/dL (ref 0.20–1.20)
Total Protein: 7.3 g/dL (ref 6.4–8.3)

## 2016-11-14 MED ORDER — SODIUM CHLORIDE 0.9% FLUSH
10.0000 mL | INTRAVENOUS | Status: DC | PRN
  Administered 2016-11-14: 10 mL
  Filled 2016-11-14: qty 10

## 2016-11-14 MED ORDER — DIPHENHYDRAMINE HCL 25 MG PO CAPS
50.0000 mg | ORAL_CAPSULE | Freq: Once | ORAL | Status: AC
  Administered 2016-11-14: 50 mg via ORAL

## 2016-11-14 MED ORDER — ACETAMINOPHEN 325 MG PO TABS
ORAL_TABLET | ORAL | Status: AC
  Filled 2016-11-14: qty 2

## 2016-11-14 MED ORDER — HEPARIN SOD (PORK) LOCK FLUSH 100 UNIT/ML IV SOLN
500.0000 [IU] | Freq: Once | INTRAVENOUS | Status: AC | PRN
  Administered 2016-11-14: 500 [IU]
  Filled 2016-11-14: qty 5

## 2016-11-14 MED ORDER — SODIUM CHLORIDE 0.9 % IV SOLN
Freq: Once | INTRAVENOUS | Status: AC
  Administered 2016-11-14: 11:00:00 via INTRAVENOUS

## 2016-11-14 MED ORDER — SODIUM CHLORIDE 0.9 % IJ SOLN
10.0000 mL | INTRAMUSCULAR | Status: DC | PRN
  Administered 2016-11-14: 10 mL via INTRAVENOUS
  Filled 2016-11-14: qty 10

## 2016-11-14 MED ORDER — DIPHENHYDRAMINE HCL 25 MG PO CAPS
ORAL_CAPSULE | ORAL | Status: AC
  Filled 2016-11-14: qty 2

## 2016-11-14 MED ORDER — TRASTUZUMAB CHEMO 150 MG IV SOLR
6.0000 mg/kg | Freq: Once | INTRAVENOUS | Status: AC
  Administered 2016-11-14: 840 mg via INTRAVENOUS
  Filled 2016-11-14: qty 40

## 2016-11-14 MED ORDER — ACETAMINOPHEN 325 MG PO TABS
650.0000 mg | ORAL_TABLET | Freq: Once | ORAL | Status: AC
  Administered 2016-11-14: 650 mg via ORAL

## 2016-11-14 NOTE — Patient Instructions (Signed)
[facility] Discharge Instructions for Patients Receiving Chemotherapy  Today you received the following chemotherapy agents:  Herceptin  To help prevent nausea and vomiting after your treatment, we encourage you to take your nausea medication as prescribed.   If you develop nausea and vomiting that is not controlled by your nausea medication, call the clinic.   BELOW ARE SYMPTOMS THAT SHOULD BE REPORTED IMMEDIATELY:  *FEVER GREATER THAN 100.5 F  *CHILLS WITH OR WITHOUT FEVER  NAUSEA AND VOMITING THAT IS NOT CONTROLLED WITH YOUR NAUSEA MEDICATION  *UNUSUAL SHORTNESS OF BREATH  *UNUSUAL BRUISING OR BLEEDING  TENDERNESS IN MOUTH AND THROAT WITH OR WITHOUT PRESENCE OF ULCERS  *URINARY PROBLEMS  *BOWEL PROBLEMS  UNUSUAL RASH Items with * indicate a potential emergency and should be followed up as soon as possible.  Feel free to call the clinic you have any questions or concerns. The clinic phone number is [phone].  Please show the CHEMO ALERT CARD at check-in to the Emergency Department and triage nurse.   

## 2016-11-21 ENCOUNTER — Ambulatory Visit: Payer: Self-pay

## 2016-11-21 ENCOUNTER — Other Ambulatory Visit: Payer: Self-pay

## 2016-11-26 ENCOUNTER — Ambulatory Visit (HOSPITAL_BASED_OUTPATIENT_CLINIC_OR_DEPARTMENT_OTHER)
Admission: RE | Admit: 2016-11-26 | Discharge: 2016-11-26 | Disposition: A | Discharge status: Home / Self Care | Payer: BLUE CROSS/BLUE SHIELD | Source: Ambulatory Visit | Attending: Cardiology | Admitting: Cardiology

## 2016-11-26 ENCOUNTER — Ambulatory Visit (HOSPITAL_COMMUNITY)
Admission: RE | Admit: 2016-11-26 | Discharge: 2016-11-26 | Disposition: A | Discharge status: Home / Self Care | Payer: BLUE CROSS/BLUE SHIELD | Source: Ambulatory Visit | Attending: Family Medicine | Admitting: Family Medicine

## 2016-11-26 ENCOUNTER — Other Ambulatory Visit: Payer: Self-pay | Admitting: *Deleted

## 2016-11-26 ENCOUNTER — Encounter (HOSPITAL_COMMUNITY): Payer: Self-pay

## 2016-11-26 VITALS — BP 146/90 | HR 92 | Wt 295.6 lb

## 2016-11-26 DIAGNOSIS — Z853 Personal history of malignant neoplasm of breast: Secondary | ICD-10-CM | POA: Insufficient documentation

## 2016-11-26 DIAGNOSIS — C50411 Malignant neoplasm of upper-outer quadrant of right female breast: Secondary | ICD-10-CM

## 2016-11-26 DIAGNOSIS — I10 Essential (primary) hypertension: Secondary | ICD-10-CM | POA: Insufficient documentation

## 2016-11-26 DIAGNOSIS — Z90710 Acquired absence of both cervix and uterus: Secondary | ICD-10-CM | POA: Insufficient documentation

## 2016-11-26 LAB — ECHOCARDIOGRAM COMPLETE
CHL CUP LV S' LATERAL: 13.4 cm/s
E decel time: 210 msec
E/e' ratio: 10.3
FS: 32 % (ref 28–44)
IVS/LV PW RATIO, ED: 1.13
LA ID, A-P, ES: 36 mm
LA diam end sys: 36 mm
LA diam index: 1.55 cm/m2
LA vol A4C: 47.1 ml
LA vol index: 28.8 mL/m2
LAVOL: 66.8 mL
LDCA: 3.46 cm2
LV E/e' medial: 10.3
LV TDI E'LATERAL: 7.07
LV TDI E'MEDIAL: 7.29
LV e' LATERAL: 7.07 cm/s
LVEEAVG: 10.3
LVOT VTI: 21.9 cm
LVOT diameter: 21 mm
LVOT peak grad rest: 7 mmHg
LVOTPV: 128 cm/s
LVOTSV: 76 mL
MV Dec: 210
MV pk A vel: 90.2 m/s
MV pk E vel: 72.8 m/s
MVPG: 2 mmHg
PW: 11.1 mm — AB (ref 0.6–1.1)
RV LATERAL S' VELOCITY: 15.2 cm/s
RV TAPSE: 26.9 mm

## 2016-11-26 MED ORDER — TAMOXIFEN CITRATE 20 MG PO TABS: 20.0000 mg | ORAL_TABLET | Freq: Every day | ORAL | 3 refills | Status: DC

## 2016-11-26 NOTE — Progress Notes (Signed)
  Echocardiogram 2D Echocardiogram has been performed.  Darlina Sicilian M 11/26/2016, 9:51 AM

## 2016-11-26 NOTE — Patient Instructions (Signed)
Your physician recommends that you schedule a follow-up appointment in: 3 months with echocardiogram  

## 2016-11-27 NOTE — Progress Notes (Signed)
Patient ID: Vanessa [name], female   DOB: 1961/10/24, 55 y.o.   MRN: [id number]  Oncologist: Dr. Jana Hakim  55 yo with breast cancer presents for cardiology evaluation prior to starting Herceptin.  She was diagnosed with triple positive breast cancer in 12/16.  Plans are lumpectomy/lymph node dissection on 12/30/15 followed by abraxane/Herceptin x 12 weeks then Herceptin alone to complete 1 year.  She completed radiation in 7/17.   She continues with Herceptin.  No exertional dyspnea.   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    Labs (12/16): K 3.7, creatinine 0.8  PMH: 1. HTN: Not currently on meds.  2. Trigeminal neuralgia 3. TAH 4. Breast cancer: Diagnosed 12/16, ER+/PR+/HER2+.   - Echo (1/17) with EF 60-65%, lateral s' 11, GLS -23.1%, mild LVH, normal RV size and systolic function.  - Echo (4/17) with EF 60%, GLS -18.1%, normal RV size and systolic function.  - Echo (7/17) with EF 60-65%, GLS -16.8%.  - Echo (9/17) with EF 60-65%, mild LVh, GLS -14.8% (but do not think strain is accurate on this study), lateral s' 12.7 cm/sec.  - Echo (12/17) with EF 60-65%, mild LVH, GLS -15.5% (but difficult strain images), lateral s' 12.3 cm/sec.   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   FH: Father with lung cancer, grandmother died from MI.  HTN, DM.   ROS: All systems reviewed and negative except as per HPI.   Current Outpatient Prescriptions  Medication Sig Dispense Refill  . acetaminophen (TYLENOL) 325 MG tablet Take 650 mg by mouth every 6 (six) hours as needed for mild pain or moderate pain. Reported on 04/18/2016    . b complex vitamins tablet Take 1 tablet by mouth daily. Reported on 06/14/2016    . cetirizine (ZYRTEC) 10 MG tablet Take 10 mg by mouth daily as needed for allergies. Reported on 06/14/2016    . diclofenac (CATAFLAM) 50 MG tablet Take 50 mg by mouth 2 (two) times daily.    Marland Kitchen gabapentin (NEURONTIN) 300 MG  capsule TAKE 1 CAPSULE (300 MG TOTAL) BY MOUTH AT BEDTIME. 90 capsule 0  . gabapentin (NEURONTIN) 600 MG tablet TAKE 1 TABLET (600 MG TOTAL) BY MOUTH 3 (THREE) TIMES DAILY. 90 tablet 6  . lidocaine-prilocaine (EMLA) cream APPLY TOPICALLY ONCE DAILY AS NEEDED 30 g 1  . Multiple Vitamins-Minerals (CENTRUM SILVER ADULT 50+ PO) Take 1 capsule by mouth daily. Reported on 03/14/2016    . ondansetron (ZOFRAN ODT) 4 MG disintegrating tablet Take 1 tablet (4 mg total) by mouth every 8 (eight) hours as needed for nausea or vomiting. 20 tablet 1  . traMADol (ULTRAM) 50 MG tablet Take 1 tablet (50 mg total) by mouth 2 (two) times daily as needed. 50 tablet 0  . tamoxifen (NOLVADEX) 20 MG tablet Take 1 tablet (20 mg total) by mouth daily. 30 tablet 3   No current facility-administered medications for this encounter.    BP (!) 146/90   Pulse 92   Wt 295 lb 9.6 oz (134.1 kg)   SpO2 97%   BMI 50.74 kg/m  General: NAD Neck: No JVD, no thyromegaly or thyroid nodule.  Lungs: Bilateral rhonchi and end expiratory wheezes.  CV: Nondisplaced PMI.  Heart regular S1/S2, no S3/S4, no murmur.  No peripheral edema.  No carotid bruit.  Normal pedal pulses.  Abdomen: Soft, nontender, no hepatosplenomegaly, no distention.  Skin: Intact without lesions or rashes.  Neurologic:  Alert and oriented x 3.  Psych: Normal affect. Extremities: No clubbing or cyanosis.  HEENT: Normal.   Assessment/Plan: 1. Breast cancer:  She continues on Herceptin therapy.  I reviewed today's echo which showed good EF and stable lateral s'.  Strain remains low, but images were very difficult for strain with poor endocardial definition.  I think she is doing ok, will repeat echo with followup in 3 months.  She will have completed Herceptin at that point, should be last echo.   2. HTN: BP controlled.   Follow up in 3 months with an ECHO   Loralie Champagne 11/27/2016

## 2016-12-04 ENCOUNTER — Other Ambulatory Visit: Payer: Self-pay | Admitting: *Deleted

## 2016-12-04 DIAGNOSIS — C50411 Malignant neoplasm of upper-outer quadrant of right female breast: Secondary | ICD-10-CM

## 2016-12-05 ENCOUNTER — Ambulatory Visit (HOSPITAL_BASED_OUTPATIENT_CLINIC_OR_DEPARTMENT_OTHER): Payer: BLUE CROSS/BLUE SHIELD

## 2016-12-05 ENCOUNTER — Other Ambulatory Visit (HOSPITAL_BASED_OUTPATIENT_CLINIC_OR_DEPARTMENT_OTHER): Payer: BLUE CROSS/BLUE SHIELD

## 2016-12-05 ENCOUNTER — Encounter: Payer: Self-pay | Admitting: *Deleted

## 2016-12-05 ENCOUNTER — Other Ambulatory Visit: Payer: Self-pay | Admitting: Hematology and Oncology

## 2016-12-05 ENCOUNTER — Ambulatory Visit: Payer: BLUE CROSS/BLUE SHIELD

## 2016-12-05 VITALS — BP 146/89 | HR 98 | Temp 98.2°F | Resp 18

## 2016-12-05 DIAGNOSIS — C50411 Malignant neoplasm of upper-outer quadrant of right female breast: Secondary | ICD-10-CM

## 2016-12-05 DIAGNOSIS — Z5112 Encounter for antineoplastic immunotherapy: Secondary | ICD-10-CM | POA: Diagnosis not present

## 2016-12-05 DIAGNOSIS — Z95828 Presence of other vascular implants and grafts: Secondary | ICD-10-CM

## 2016-12-05 LAB — CBC WITH DIFFERENTIAL/PLATELET
BASO%: 0.9 % (ref 0.0–2.0)
BASOS ABS: 0 10*3/uL (ref 0.0–0.1)
EOS%: 5 % (ref 0.0–7.0)
Eosinophils Absolute: 0.2 10*3/uL (ref 0.0–0.5)
HEMATOCRIT: 39.5 % (ref 34.8–46.6)
HGB: 12.9 g/dL (ref 11.6–15.9)
LYMPH#: 1.5 10*3/uL (ref 0.9–3.3)
LYMPH%: 38.4 % (ref 14.0–49.7)
MCH: 27.7 pg (ref 25.1–34.0)
MCHC: 32.6 g/dL (ref 31.5–36.0)
MCV: 85 fL (ref 79.5–101.0)
MONO#: 0.3 10*3/uL (ref 0.1–0.9)
MONO%: 6.8 % (ref 0.0–14.0)
NEUT#: 2 10*3/uL (ref 1.5–6.5)
NEUT%: 48.9 % (ref 38.4–76.8)
PLATELETS: 223 10*3/uL (ref 145–400)
RBC: 4.65 10*6/uL (ref 3.70–5.45)
RDW: 14.4 % (ref 11.2–14.5)
WBC: 4 10*3/uL (ref 3.9–10.3)

## 2016-12-05 LAB — COMPREHENSIVE METABOLIC PANEL
ALT: 16 U/L (ref 0–55)
ANION GAP: 9 meq/L (ref 3–11)
AST: 19 U/L (ref 5–34)
Albumin: 3.4 g/dL — ABNORMAL LOW (ref 3.5–5.0)
Alkaline Phosphatase: 96 U/L (ref 40–150)
BUN: 14.1 mg/dL (ref 7.0–26.0)
CALCIUM: 8.9 mg/dL (ref 8.4–10.4)
CHLORIDE: 108 meq/L (ref 98–109)
CO2: 25 mEq/L (ref 22–29)
CREATININE: 0.7 mg/dL (ref 0.6–1.1)
Glucose: 97 mg/dl (ref 70–140)
POTASSIUM: 3.6 meq/L (ref 3.5–5.1)
Sodium: 142 mEq/L (ref 136–145)
Total Bilirubin: 0.27 mg/dL (ref 0.20–1.20)
Total Protein: 7.6 g/dL (ref 6.4–8.3)

## 2016-12-05 MED ORDER — SODIUM CHLORIDE 0.9% FLUSH
10.0000 mL | INTRAVENOUS | Status: DC | PRN
  Administered 2016-12-05: 10 mL
  Filled 2016-12-05: qty 10

## 2016-12-05 MED ORDER — TRASTUZUMAB CHEMO 150 MG IV SOLR
6.0000 mg/kg | Freq: Once | INTRAVENOUS | Status: AC
  Administered 2016-12-05: 840 mg via INTRAVENOUS
  Filled 2016-12-05: qty 40

## 2016-12-05 MED ORDER — SODIUM CHLORIDE 0.9 % IV SOLN
Freq: Once | INTRAVENOUS | Status: AC
  Administered 2016-12-05: 10:00:00 via INTRAVENOUS

## 2016-12-05 MED ORDER — ACETAMINOPHEN 325 MG PO TABS
ORAL_TABLET | ORAL | Status: AC
  Filled 2016-12-05: qty 2

## 2016-12-05 MED ORDER — SODIUM CHLORIDE 0.9 % IJ SOLN
10.0000 mL | INTRAMUSCULAR | Status: DC | PRN
  Administered 2016-12-05: 10 mL via INTRAVENOUS
  Filled 2016-12-05: qty 10

## 2016-12-05 MED ORDER — DIPHENHYDRAMINE HCL 25 MG PO CAPS
ORAL_CAPSULE | ORAL | Status: AC
  Filled 2016-12-05: qty 2

## 2016-12-05 MED ORDER — ACETAMINOPHEN 325 MG PO TABS
650.0000 mg | ORAL_TABLET | Freq: Once | ORAL | Status: AC
  Administered 2016-12-05: 650 mg via ORAL

## 2016-12-05 MED ORDER — DIPHENHYDRAMINE HCL 25 MG PO CAPS
50.0000 mg | ORAL_CAPSULE | Freq: Once | ORAL | Status: AC
  Administered 2016-12-05: 50 mg via ORAL

## 2016-12-05 MED ORDER — HEPARIN SOD (PORK) LOCK FLUSH 100 UNIT/ML IV SOLN
500.0000 [IU] | Freq: Once | INTRAVENOUS | Status: AC | PRN
  Administered 2016-12-05: 500 [IU]
  Filled 2016-12-05: qty 5

## 2016-12-05 NOTE — Patient Instructions (Signed)
[name] Discharge Instructions for Patients Receiving Chemotherapy  Today you received the following chemotherapy agents: Herceptin   To help prevent nausea and vomiting after your treatment, we encourage you to take your nausea medication as directed.    If you develop nausea and vomiting that is not controlled by your nausea medication, call the clinic.   BELOW ARE SYMPTOMS THAT SHOULD BE REPORTED IMMEDIATELY:  *FEVER GREATER THAN 100.5 F  *CHILLS WITH OR WITHOUT FEVER  NAUSEA AND VOMITING THAT IS NOT CONTROLLED WITH YOUR NAUSEA MEDICATION  *UNUSUAL SHORTNESS OF BREATH  *UNUSUAL BRUISING OR BLEEDING  TENDERNESS IN MOUTH AND THROAT WITH OR WITHOUT PRESENCE OF ULCERS  *URINARY PROBLEMS  *BOWEL PROBLEMS  UNUSUAL RASH Items with * indicate a potential emergency and should be followed up as soon as possible.  Feel free to call the clinic you have any questions or concerns. The clinic phone number is [phone].  Please show the CHEMO ALERT CARD at check-in to the Emergency Department and triage nurse.   

## 2016-12-22 ENCOUNTER — Other Ambulatory Visit: Payer: Self-pay | Admitting: Oncology

## 2017-01-02 ENCOUNTER — Other Ambulatory Visit: Payer: Self-pay | Admitting: Obstetrics and Gynecology

## 2017-01-02 DIAGNOSIS — Z853 Personal history of malignant neoplasm of breast: Secondary | ICD-10-CM

## 2017-01-09 ENCOUNTER — Ambulatory Visit
Admission: RE | Admit: 2017-01-09 | Discharge: 2017-01-09 | Disposition: A | Discharge status: Home / Self Care | Payer: BLUE CROSS/BLUE SHIELD | Source: Ambulatory Visit | Attending: Obstetrics and Gynecology | Admitting: Obstetrics and Gynecology

## 2017-01-09 DIAGNOSIS — Z853 Personal history of malignant neoplasm of breast: Secondary | ICD-10-CM

## 2017-01-09 DIAGNOSIS — N644 Mastodynia: Secondary | ICD-10-CM | POA: Diagnosis not present

## 2017-01-22 DIAGNOSIS — H6692 Otitis media, unspecified, left ear: Secondary | ICD-10-CM | POA: Diagnosis not present

## 2017-02-03 ENCOUNTER — Ambulatory Visit: Payer: Self-pay | Admitting: Surgery

## 2017-02-03 DIAGNOSIS — Z9889 Other specified postprocedural states: Secondary | ICD-10-CM | POA: Diagnosis not present

## 2017-02-03 DIAGNOSIS — Z853 Personal history of malignant neoplasm of breast: Secondary | ICD-10-CM | POA: Diagnosis not present

## 2017-02-03 NOTE — H&P (Signed)
Vanessa [name] 02/03/2017 10:36 AM Location: [facility] Patient #: [id number] DOB: 03/06/1961 Single / Language: English / Race: Black or African American Female  History of Present Illness (Tandi Hanko A. Oaklen Thiam MD; 02/03/2017 11:21 AM) Patient words: Patient returns for follow-up of stage II breast cancer treated one year ago with breast conservation therapy and postoperative chemotherapy. She has completed chemotherapy and radiation therapy. She has some right shoulder stiffness. Her port is still in place and she is anxious to have it removed. Otherwise, her energy is increasing and she slowly feeling better.                  CLINICAL DATA: 55-year-old female with history of right breast cancer post lumpectomy 12/29/2015 followed by radiation therapy. Patient reports nonfocal pain involving the lumpectomy scar radiating towards the nipple. The patient describes this as a burning type pain.  EXAM: 2D DIGITAL DIAGNOSTIC BILATERAL MAMMOGRAM WITH CAD AND ADJUNCT TOMO  COMPARISON: Previous exam(s).  ACR Breast Density Category b: There are scattered areas of fibroglandular density.  FINDINGS: No suspicious masses or calcifications are seen in either breast. Postoperative changes are present in the upper-outer posterior right breast related to interval lumpectomy. Spot compression magnification MLO views of the lumpectomy site in the right breast were performed. There is no mammographic evidence of locally recurrent malignancy.  Mammographic images were processed with CAD.  IMPRESSION: 1. No mammographic evidence of malignancy in either breast.  2. No mammographic abnormalities to explain the nonfocal pain throughout the outer right breast. This pain is likely related to interval breast surgery and radiation therapy.  RECOMMENDATION: 1. Recommend further management of the right breast pain be based on clinical assessment.  2. Diagnostic  mammogram is suggested in 1 year. (Code:[id number]-B-01Y)  I have discussed the findings and recommendations with the patient. Results were also provided in writing at the conclusion of the visit. If applicable, a reminder letter will be sent to the patient regarding the next appointment.  BI-RADS CATEGORY 2: Benign.   Electronically Signed By: Jennifer Jarosz M.D. On: 01/09/2017 08:41.  The patient is a 55 year old female.   Allergies (Christen Lambert, RMA; 02/03/2017 10:37 AM) Penicillin G Potassium *PENICILLINS* Codeine Phosphate *ANALGESICS - OPIOID* Darvocet A500 *ANALGESICS - OPIOID* Demerol *ANALGESICS - OPIOID* Erythromycin *DERMATOLOGICALS* Flexeril *MUSCULOSKELETAL THERAPY AGENTS* Percocet *ANALGESICS - OPIOID* Sulfa 10 *OPHTHALMIC AGENTS*  Medication History (Christen Lambert, RMA; 02/03/2017 10:37 AM) Ondansetron (4MG Tablet Disint, Oral) Active. Vitamin D (Ergocalciferol) (50000UNIT Capsule, Oral) Active. Tylenol (325MG Tablet, Oral) Active. Multiple Vitamin (Oral) Active. Diclofenac Sodium (75MG Tablet DR, Oral) Active. Gabapentin (600MG Tablet, Oral) Active. Medications Reconciled    Vitals (Christen Lambert RMA; 02/03/2017 10:38 AM) 02/03/2017 10:37 AM Weight: 291 lb Height: 64in Body Surface Area: 2.29 m Body Mass Index: 49.95 kg/m  Temp.: 97.9F  Pulse: 92 (Regular)  BP: 150/90 (Sitting, Left Arm, Standard)      Physical Exam (Kevonte Vanecek A. Anay Walter MD; 02/03/2017 11:22 AM)  General Mental Status-Alert. General Appearance-Consistent with stated age. Hydration-Well hydrated. Voice-Normal.  Head and Neck Head-normocephalic, atraumatic with no lesions or palpable masses. Trachea-midline. Thyroid Gland Characteristics - normal size and consistency.  Breast Note: Port-A-Cath in place right upper chest region. Right breast status post radiation and surgical changes. No masses. Right arm range of motion is good  except for some stiffness at approximately 120. Left breast is normal.  Musculoskeletal Note: See above  Lymphatic Head & Neck  General Head & Neck Lymphatics: Bilateral - Description -   Normal. Axillary  General Axillary Region: Bilateral - Description - Normal. Tenderness - Non Tender.    Assessment & Plan (Ronald Vinsant A. Albirta Rhinehart MD; 02/03/2017 11:22 AM)  POST-OPERATIVE STATE (Z98.890) Impression: Patient desires removal for poor. Risks, benefits and alternatives of surgery were discussed. We will consider her for port removal.  Current Plans I recommended surgery to remove the catheter. I explained the technique of removal with use of local anesthesia & possible need for more aggressive sedation/anesthesia for patient comfort.  Risks such as bleeding, infection, and other risks were discussed. Post-operative dressing/incision care was discussed. I noted a good likelihood this will help address the problem. We will work to minimize complications. Questions were answered. The patient expresses understanding & wishes to proceed with surgery.  You are being scheduled for surgery- Our schedulers will call you.  You should hear from our office's scheduling department within 5 working days about the location, date, and time of surgery. We try to make accommodations for patient's preferences in scheduling surgery, but sometimes the OR schedule or the surgeon's schedule prevents us from making those accommodations.  If you have not heard from our office ([phone]) in 5 working days, call the office and ask for your surgeon's nurse.  If you have other questions about your diagnosis, plan, or surgery, call the office and ask for your surgeon's nurse.  HISTORY OF BREAST CANCER (Z85.3)  Current Plans Pt Education - [facility] Free Text Education/Instructions: discussed with patient and provided information. 

## 2017-02-11 ENCOUNTER — Encounter (HOSPITAL_BASED_OUTPATIENT_CLINIC_OR_DEPARTMENT_OTHER): Payer: Self-pay | Admitting: *Deleted

## 2017-02-11 NOTE — Pre-Procedure Instructions (Signed)
Airway evaluation per Dr. Lissa Hoard; pt. OK to come for surgery.  Boost Breeze 8 oz. given to pt. with instructions to drink by 0930 DOS.

## 2017-02-13 ENCOUNTER — Encounter: Payer: Self-pay | Admitting: Physical Therapy

## 2017-02-13 ENCOUNTER — Ambulatory Visit: Payer: BLUE CROSS/BLUE SHIELD | Attending: Surgery | Admitting: Physical Therapy

## 2017-02-13 DIAGNOSIS — G8929 Other chronic pain: Secondary | ICD-10-CM | POA: Diagnosis not present

## 2017-02-13 DIAGNOSIS — M25512 Pain in left shoulder: Secondary | ICD-10-CM | POA: Diagnosis not present

## 2017-02-13 DIAGNOSIS — M25511 Pain in right shoulder: Secondary | ICD-10-CM | POA: Diagnosis not present

## 2017-02-13 DIAGNOSIS — M25612 Stiffness of left shoulder, not elsewhere classified: Secondary | ICD-10-CM | POA: Diagnosis not present

## 2017-02-13 DIAGNOSIS — M25611 Stiffness of right shoulder, not elsewhere classified: Secondary | ICD-10-CM | POA: Diagnosis not present

## 2017-02-13 NOTE — Patient Instructions (Signed)
Patient was instructed today in a home exercise program today for post op shoulder range of motion. These included active assist shoulder flexion in sitting, scapular retraction, wall walking with shoulder abduction, and hands behind head external rotation.  She was encouraged to do these twice a day, holding 3 seconds and repeating 5 times.    

## 2017-02-13 NOTE — Therapy (Signed)
[place], [place], [address] Phone: [phone]   Fax:  [phone]  Physical Therapy Treatment  Patient Details  Name: Vanessa [name] MRN: [id number] Date of Birth: 05/26/1961 Referring Provider: Dr. Erroll Luna  Encounter Date: 02/13/2017      PT End of Session - 02/13/17 0832    Visit Number 1   Number of Visits 16   Date for PT Re-Evaluation 04/10/17   PT Start Time 0755   PT Stop Time 0845   PT Time Calculation (min) 50 min   Activity Tolerance Patient tolerated treatment well   Behavior During Therapy [facility] for tasks assessed/performed      Past Medical History:  Diagnosis Date  . Anxiety   . Arthritis    knees  . Family history of breast cancer   . Family history of colon cancer   . Family history of thyroid cancer   . History of breast cancer 2017   right  . History of chemotherapy 2017  . History of trigeminal neuralgia   . PONV (postoperative nausea and vomiting)   . Wears partial dentures    upper    Past Surgical History:  Procedure Laterality Date  . BREAST LUMPECTOMY WITH RADIOACTIVE SEED AND SENTINEL LYMPH NODE BIOPSY Right 12/29/2015   Procedure: RIGHT BREAST  RADIOACTIVE SEED LOCALIZATION LUMPECTOMY AND RIGHT SENTINEL LYMPH NODE MAPPING;  Surgeon: Erroll Luna, MD;  Location: [facility];  Service: General;  Laterality: Right;  . CYSTOSCOPY  06/28/2010  . KNEE ARTHROSCOPY Left 07/11/2011  . LESION EXCISION Bilateral 01/20/2014   upper and lower eyelids  . PORTACATH PLACEMENT N/A 12/29/2015   Procedure: INSERTION PORT-A-CATH;  Surgeon: Erroll Luna, MD;  Location: [facility] [place];  Service: General;  Laterality: N/A;  . TOTAL ABDOMINAL HYSTERECTOMY W/ BILATERAL SALPINGOOPHORECTOMY  06/28/2010    There were no vitals filed for this visit.      Subjective Assessment - 02/13/17 0802    Subjective Patient reports she had a right  lumpectomy and sentinel node biopsy on 12/30/15 for HER2+ and estrogen receptive breast cancer and has had shoulder stiffness since that time. The one lymph node was negative. Chemo and radiation after surgery in 2017. She is scheduled to have her port removed on 02/18/17. Recent mammogram was negative.   Pertinent History Right lumpectomy and sentinel node biopsy on 12/30/15 for HER2+ and estrogen receptive breast cancer. The one lymph node was negative. Chemo and radiation after surgery in 2017.    Patient Stated Goals Improve shoulder ROM   Currently in Pain? Yes   Pain Score 8    Pain Location Breast  Radiating down right arm   Pain Orientation Right   Pain Descriptors / Indicators Shooting   Pain Type Neuropathic pain   Pain Radiating Towards Right arm   Pain Onset More than a month ago   Pain Frequency Intermittent   Aggravating Factors  Unknown   Pain Relieving Factors Unknown   Multiple Pain Sites No            [facility] PT Assessment - 02/13/17 0001      Assessment   Medical Diagnosis s/p right lumpectomy   Referring Provider Dr. Marcello Moores [place]   Onset Date/Surgical Date 12/30/15   Hand Dominance Right   Prior Therapy none     Precautions   Precautions Other (comment)   Precaution Comments Right arm lymphedema risk     Restrictions   Weight Bearing  Restrictions No     Balance Screen   Has the patient fallen in the past 6 months No   Has the patient had a decrease in activity level because of a fear of falling?  No   Is the patient reluctant to leave their home because of a fear of falling?  No     Home Environment   Living Environment Private residence   Living Arrangements Children  3 y.o. daughter; 90 y.o. son is in [place]   Available Help at Discharge Family     Prior Function   Level of [place] Full time employment   Vocation Requirements Asst to president of [employer]   Leisure She walks once a week for 30 minutes     Cognition    Overall Cognitive Status Within Functional Limits for tasks assessed     Posture/Postural Control   Posture/Postural Control Postural limitations   Postural Limitations Rounded Shoulders;Forward head     ROM / Strength   AROM / PROM / Strength AROM;Strength     AROM   Overall AROM Comments Cervical ROM pt reports restriction from port-a-cath   AROM Assessment Site Shoulder;Cervical   Right/Left Shoulder Right;Left   Right Shoulder Extension 46 Degrees   Right Shoulder Flexion 126 Degrees   Right Shoulder ABduction 122 Degrees   Right Shoulder Internal Rotation 50 Degrees   Right Shoulder External Rotation 87 Degrees   Left Shoulder Extension 49 Degrees   Left Shoulder Flexion 146 Degrees   Left Shoulder ABduction 151 Degrees   Left Shoulder Internal Rotation 63 Degrees   Left Shoulder External Rotation 88 Degrees   Cervical Flexion WNL   Cervical Extension WNL   Cervical - Right Side Bend WNL   Cervical - Left Side Bend WNL   Cervical - Right Rotation WNL   Cervical - Left Rotation WNL     Strength   Overall Strength Comments Right shoulder strength limited by pain   Strength Assessment Site Shoulder   Right/Left Shoulder Right;Left   Right Shoulder Flexion 4+/5   Right Shoulder Extension 5/5   Right Shoulder ABduction 4-/5   Right Shoulder Internal Rotation 3+/5   Right Shoulder External Rotation 5/5   Left Shoulder Flexion 5/5   Left Shoulder Extension 5/5   Left Shoulder ABduction 4-/5   Left Shoulder Internal Rotation 5/5   Left Shoulder External Rotation 5/5     Flexibility   Soft Tissue Assessment /Muscle Length yes  Neural tension present right arm to hand     Palpation   Palpation comment Palpable scar tissue and tightness present on right lateral breast at scar site; visible cording present right upper extremity           LYMPHEDEMA/ONCOLOGY QUESTIONNAIRE - 02/13/17 0822      Type   Cancer Type Right breast     Surgeries   Lumpectomy Date  12/30/15   Sentinel Lymph Node Biopsy Date 12/30/15   Number Lymph Nodes Removed 1     Treatment   Active Chemotherapy Treatment No   Past Chemotherapy Treatment Yes   Date 12/05/16  Last day of Herceptin   Active Radiation Treatment No   Past Radiation Treatment Yes   Body Site right breast   Current Hormone Treatment Yes   Date 08/09/16  Start date   Drug Name Tamoxifen     What other symptoms do you have   Are you Having Heaviness or Tightness Yes   Are you  having Pain Yes   Are you having pitting edema No   Is it Hard or Difficult finding clothes that fit No   Do you have infections No   Is there Decreased scar mobility Yes   Stemmer Sign No     Lymphedema Assessments   Lymphedema Assessments Upper extremities     Right Upper Extremity Lymphedema   15 cm Proximal to Olecranon Process 42.9 cm   10 cm Proximal to Olecranon Process 41.7 cm   Olecranon Process 31 cm   15 cm Proximal to Ulnar Styloid Process 30.4 cm   10 cm Proximal to Ulnar Styloid Process 27.6 cm   Just Proximal to Ulnar Styloid Process 18.5 cm   Across Hand at [employer] 20.5 cm   At [place] of 2nd Digit 6.6 cm     Left Upper Extremity Lymphedema   15 cm Proximal to Olecranon Process 41.9 cm   10 cm Proximal to Olecranon Process 41.7 cm   Olecranon Process 30.8 cm   15 cm Proximal to Ulnar Styloid Process 29.8 cm   10 cm Proximal to Ulnar Styloid Process 27 cm   Just Proximal to Ulnar Styloid Process 17.8 cm   Across Hand at [employer] 19.5 cm   At [place] of 2nd Digit 6.5 cm           Quick Dash - 02/13/17 0001    Open a tight or new jar Moderate difficulty   Do heavy household chores (wash walls, wash floors) Moderate difficulty   Carry a shopping bag or briefcase Moderate difficulty   Wash your back Severe difficulty   Use a knife to cut food No difficulty   Recreational activities in which you take some force or impact through your arm, shoulder, or hand (golf, hammering, tennis)  Unable   During the past week, to what extent has your arm, shoulder or hand problem interfered with your normal social activities with family, friends, neighbors, or groups? Modererately   During the past week, to what extent has your arm, shoulder or hand problem limited your work or other regular daily activities Modererately   Arm, shoulder, or hand pain. Moderate   Tingling (pins and needles) in your arm, shoulder, or hand Moderate   Difficulty Sleeping Severe difficulty   DASH Score 54.55 %                       PT Education - 02/13/17 0840    Education provided Yes   Education Details Shoulder stretches / posture exercises   Person(s) Educated Patient   Methods Explanation;Demonstration;Handout   Comprehension Returned demonstration;Verbalized understanding           Short Term Clinic Goals - 02/13/17 1037      CC Short Term Goal  #1   Title Patient will be independent in her initial home exercise program.   Time 4   Period Weeks   Status New     CC Short Term Goal  #2   Title Increase right shoulder flexion to >/= 135 degrees for increased ease reaching overhead.   Time 4   Period Weeks   Status New     CC Short Term Goal  #3   Title Increase right shoulder abduction to >/= 135 degrees for increased ease reaching overhead.   Time 4   Period Weeks   Status New             Long Term  Clinic Goals - 02/13/17 1039      CC Long Term Goal  #1   Title Patient will be independent in her home exercises program including a safe self-progression.   Time 8   Period Weeks   Status New     CC Long Term Goal  #2   Title Increase right shoulder flexion to >/= 150 degrees for increased ease reaching overhead.   Time 8   Period Weeks   Status New     CC Long Term Goal  #3   Title Increase right shoulder flexion to >/= 150 degrees for increased ease reaching overhead.   Time 8   Period Weeks   Status New     CC Long Term Goal  #4   Title  Patient will be able to participate in a long-term exercise program including the [employer] program at the [employer] where she works.   Time 8   Period Weeks   Status New            Plan - 02/13/17 0350    Clinical Impression Statement Patient is a very pleasant 56 y.o. woman s/p right lumpectomy and sentinel node biopsy with chemo and radiation and completed treatment in 12/17. She has very limited shoulder ROM and postural deficits with tightness in her right anterior chest. She will benefit from PT to regain full arm function and reduce neural tension. Due to her lack of comorbidities, her eval is of low complexity.   Rehab Potential Good   Clinical Impairments Affecting Rehab Potential Surgery was > 1 year ago   PT Frequency 2x / week   PT Duration 8 weeks   PT Treatment/Interventions ADLs/Self Care Home Management;Therapeutic activities;Therapeutic exercise;Manual techniques;Passive range of motion;Scar mobilization;Patient/family education   PT Next Visit Plan PROM right shoulder, ROM exercises, myofascial release right chest and axillary region   PT Home Exercise Plan Shoulder ROM   Consulted and Agree with Plan of Care Patient      Patient will benefit from skilled therapeutic intervention in order to improve the following deficits and impairments:  Decreased range of motion, Increased fascial restricitons, Impaired UE functional use, Pain, Decreased knowledge of precautions, Postural dysfunction, Decreased strength  Visit Diagnosis: Chronic right shoulder pain - Plan: PT plan of care cert/re-cert  Stiffness of right shoulder, not elsewhere classified - Plan: PT plan of care cert/re-cert     Problem List Patient Active Problem List   Diagnosis Date Noted  . Port catheter in place 03/28/2016  . 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 Obesity, morbid, BMI 40.0-49.9 ([place]) 01/15/2016  . Genetic testing 12/26/2015  . Family history of breast cancer   . Family  history of colon cancer   . Family history of thyroid cancer   . Breast cancer of upper-outer quadrant of right female breast ([place]) 11/22/2015  . Trigeminal neuralgia of left side of face 06/22/2014  . Essential hypertension 04/24/2014    Annia Friendly, PT 02/13/17 10:43 AM  [place], [place], [address] Phone: [phone]   Fax:  [phone]  Name: Vanessa [name] MRN: [id number] Date of Birth: [birth year]

## 2017-02-18 ENCOUNTER — Encounter (HOSPITAL_BASED_OUTPATIENT_CLINIC_OR_DEPARTMENT_OTHER)
Admission: RE | Disposition: A | Discharge status: Home / Self Care | Payer: Self-pay | Source: Ambulatory Visit | Attending: Surgery

## 2017-02-18 ENCOUNTER — Ambulatory Visit (HOSPITAL_BASED_OUTPATIENT_CLINIC_OR_DEPARTMENT_OTHER): Payer: BLUE CROSS/BLUE SHIELD | Admitting: Anesthesiology

## 2017-02-18 ENCOUNTER — Encounter (HOSPITAL_BASED_OUTPATIENT_CLINIC_OR_DEPARTMENT_OTHER): Payer: Self-pay | Admitting: *Deleted

## 2017-02-18 ENCOUNTER — Ambulatory Visit (HOSPITAL_BASED_OUTPATIENT_CLINIC_OR_DEPARTMENT_OTHER)
Admission: RE | Admit: 2017-02-18 | Discharge: 2017-02-18 | Disposition: A | Discharge status: Home / Self Care | Payer: BLUE CROSS/BLUE SHIELD | Source: Ambulatory Visit | Attending: Surgery | Admitting: Surgery

## 2017-02-18 DIAGNOSIS — Z79899 Other long term (current) drug therapy: Secondary | ICD-10-CM | POA: Insufficient documentation

## 2017-02-18 DIAGNOSIS — Z853 Personal history of malignant neoplasm of breast: Secondary | ICD-10-CM | POA: Diagnosis not present

## 2017-02-18 DIAGNOSIS — G500 Trigeminal neuralgia: Secondary | ICD-10-CM | POA: Diagnosis not present

## 2017-02-18 DIAGNOSIS — Z452 Encounter for adjustment and management of vascular access device: Secondary | ICD-10-CM | POA: Insufficient documentation

## 2017-02-18 DIAGNOSIS — Z9221 Personal history of antineoplastic chemotherapy: Secondary | ICD-10-CM | POA: Insufficient documentation

## 2017-02-18 DIAGNOSIS — C50311 Malignant neoplasm of lower-inner quadrant of right female breast: Secondary | ICD-10-CM | POA: Diagnosis not present

## 2017-02-18 DIAGNOSIS — Z923 Personal history of irradiation: Secondary | ICD-10-CM | POA: Diagnosis not present

## 2017-02-18 DIAGNOSIS — I10 Essential (primary) hypertension: Secondary | ICD-10-CM | POA: Diagnosis not present

## 2017-02-18 DIAGNOSIS — Z6841 Body Mass Index (BMI) 40.0 and over, adult: Secondary | ICD-10-CM | POA: Diagnosis not present

## 2017-02-18 DIAGNOSIS — Z79810 Long term (current) use of selective estrogen receptor modulators (SERMs): Secondary | ICD-10-CM | POA: Diagnosis not present

## 2017-02-18 HISTORY — DX: Presence of dental prosthetic device (complete) (partial): Z97.2

## 2017-02-18 HISTORY — DX: Personal history of other diseases of the nervous system and sense organs: Z86.69

## 2017-02-18 HISTORY — DX: Personal history of malignant neoplasm of breast: Z85.3

## 2017-02-18 HISTORY — DX: Personal history of antineoplastic chemotherapy: Z92.21

## 2017-02-18 HISTORY — PX: PORT-A-CATH REMOVAL: SHX5289

## 2017-02-18 SURGERY — REMOVAL PORT-A-CATH
Anesthesia: Monitor Anesthesia Care | Site: Chest | Laterality: Right

## 2017-02-18 MED ORDER — LIDOCAINE 2% (20 MG/ML) 5 ML SYRINGE
INTRAMUSCULAR | Status: AC
  Filled 2017-02-18: qty 5

## 2017-02-18 MED ORDER — BUPIVACAINE HCL (PF) 0.25 % IJ SOLN
INTRAMUSCULAR | Status: DC | PRN
  Administered 2017-02-18: 11 mL

## 2017-02-18 MED ORDER — PROPOFOL 10 MG/ML IV BOLUS
INTRAVENOUS | Status: DC | PRN
  Administered 2017-02-18: 20 mg via INTRAVENOUS
  Administered 2017-02-18 (×4): 10 mg via INTRAVENOUS
  Administered 2017-02-18 (×2): 20 mg via INTRAVENOUS

## 2017-02-18 MED ORDER — LIDOCAINE 2% (20 MG/ML) 5 ML SYRINGE
INTRAMUSCULAR | Status: DC | PRN
  Administered 2017-02-18: 40 mg via INTRAVENOUS

## 2017-02-18 MED ORDER — MIDAZOLAM HCL 5 MG/5ML IJ SOLN
INTRAMUSCULAR | Status: DC | PRN
  Administered 2017-02-18: 2 mg via INTRAVENOUS

## 2017-02-18 MED ORDER — ONDANSETRON HCL 4 MG/2ML IJ SOLN
INTRAMUSCULAR | Status: AC
Start: 2017-02-18 — End: 2017-02-18
  Filled 2017-02-18: qty 2

## 2017-02-18 MED ORDER — PROMETHAZINE HCL 25 MG/ML IJ SOLN: 6.2500 mg | INTRAMUSCULAR | Status: DC | PRN

## 2017-02-18 MED ORDER — MIDAZOLAM HCL 2 MG/2ML IJ SOLN
INTRAMUSCULAR | Status: AC
  Filled 2017-02-18: qty 2

## 2017-02-18 MED ORDER — BUPIVACAINE HCL (PF) 0.25 % IJ SOLN
INTRAMUSCULAR | Status: AC
  Filled 2017-02-18: qty 30

## 2017-02-18 MED ORDER — ONDANSETRON HCL 4 MG/2ML IJ SOLN
INTRAMUSCULAR | Status: DC | PRN
  Administered 2017-02-18: 4 mg via INTRAVENOUS

## 2017-02-18 MED ORDER — FENTANYL CITRATE (PF) 100 MCG/2ML IJ SOLN
INTRAMUSCULAR | Status: DC | PRN
  Administered 2017-02-18: 100 ug via INTRAVENOUS

## 2017-02-18 MED ORDER — IBUPROFEN 800 MG PO TABS: 800.0000 mg | ORAL_TABLET | Freq: Three times a day (TID) | ORAL | 0 refills | Status: DC | PRN

## 2017-02-18 MED ORDER — CLINDAMYCIN PHOSPHATE 600 MG/50ML IV SOLN
INTRAVENOUS | Status: AC
  Filled 2017-02-18: qty 50

## 2017-02-18 MED ORDER — SCOPOLAMINE 1 MG/3DAYS TD PT72
1.0000 | MEDICATED_PATCH | Freq: Once | TRANSDERMAL | Status: DC | PRN
  Administered 2017-02-18: 1.5 mg via TRANSDERMAL

## 2017-02-18 MED ORDER — FENTANYL CITRATE (PF) 100 MCG/2ML IJ SOLN: 50.0000 ug | INTRAMUSCULAR | Status: DC | PRN

## 2017-02-18 MED ORDER — SCOPOLAMINE 1 MG/3DAYS TD PT72
MEDICATED_PATCH | TRANSDERMAL | Status: AC
  Filled 2017-02-18: qty 1

## 2017-02-18 MED ORDER — LACTATED RINGERS IV SOLN
INTRAVENOUS | Status: DC
  Administered 2017-02-18 (×2): via INTRAVENOUS

## 2017-02-18 MED ORDER — MIDAZOLAM HCL 2 MG/2ML IJ SOLN: 1.0000 mg | INTRAMUSCULAR | Status: DC | PRN

## 2017-02-18 MED ORDER — CHLORHEXIDINE GLUCONATE CLOTH 2 % EX PADS: 6.0000 | MEDICATED_PAD | Freq: Once | CUTANEOUS | Status: DC

## 2017-02-18 MED ORDER — FENTANYL CITRATE (PF) 100 MCG/2ML IJ SOLN
INTRAMUSCULAR | Status: AC
  Filled 2017-02-18: qty 2

## 2017-02-18 MED ORDER — CLINDAMYCIN PHOSPHATE 600 MG/50ML IV SOLN
600.0000 mg | Freq: Once | INTRAVENOUS | Status: AC
  Administered 2017-02-18: 600 mg via INTRAVENOUS

## 2017-02-18 MED ORDER — PROPOFOL 10 MG/ML IV BOLUS
INTRAVENOUS | Status: AC
  Filled 2017-02-18: qty 20

## 2017-02-18 SURGICAL SUPPLY — 40 items
ADH SKN CLS APL DERMABOND .7 (GAUZE/BANDAGES/DRESSINGS) ×1
APL SKNCLS STERI-STRIP NONHPOA (GAUZE/BANDAGES/DRESSINGS)
BENZOIN TINCTURE PRP APPL 2/3 (GAUZE/BANDAGES/DRESSINGS) IMPLANT
BLADE SURG 15 STRL LF DISP TIS (BLADE) ×1 IMPLANT
BLADE SURG 15 STRL SS (BLADE) ×3
CHLORAPREP W/TINT 26ML (MISCELLANEOUS) ×3 IMPLANT
CLOSURE WOUND 1/2 X4 (GAUZE/BANDAGES/DRESSINGS)
COVER BACK TABLE 60X90IN (DRAPES) ×3 IMPLANT
COVER MAYO STAND STRL (DRAPES) ×3 IMPLANT
DECANTER SPIKE VIAL GLASS SM (MISCELLANEOUS) ×1 IMPLANT
DERMABOND ADVANCED (GAUZE/BANDAGES/DRESSINGS) ×2
DERMABOND ADVANCED .7 DNX12 (GAUZE/BANDAGES/DRESSINGS) ×1 IMPLANT
DRAPE LAPAROTOMY 100X72 PEDS (DRAPES) ×3 IMPLANT
DRAPE UTILITY XL STRL (DRAPES) ×3 IMPLANT
ELECT REM PT RETURN 9FT ADLT (ELECTROSURGICAL)
ELECTRODE REM PT RTRN 9FT ADLT (ELECTROSURGICAL) ×1 IMPLANT
GLOVE BIOGEL PI IND STRL 7.0 (GLOVE) IMPLANT
GLOVE BIOGEL PI IND STRL 8 (GLOVE) ×1 IMPLANT
GLOVE BIOGEL PI INDICATOR 7.0 (GLOVE) ×2
GLOVE BIOGEL PI INDICATOR 8 (GLOVE) ×2
GLOVE ECLIPSE 8.0 STRL XLNG CF (GLOVE) ×3 IMPLANT
GLOVE EXAM NITRILE EXT CUFF MD (GLOVE) ×2 IMPLANT
GLOVE SURG SYN 7.5  E (GLOVE) ×2
GLOVE SURG SYN 7.5 E (GLOVE) ×1 IMPLANT
GLOVE SURG SYN 7.5 PF PI (GLOVE) IMPLANT
GOWN STRL REUS W/ TWL LRG LVL3 (GOWN DISPOSABLE) ×2 IMPLANT
GOWN STRL REUS W/TWL LRG LVL3 (GOWN DISPOSABLE) ×6
NDL HYPO 25X1 1.5 SAFETY (NEEDLE) ×1 IMPLANT
NEEDLE HYPO 25X1 1.5 SAFETY (NEEDLE) ×3 IMPLANT
NS IRRIG 1000ML POUR BTL (IV SOLUTION) ×1 IMPLANT
PACK BASIN DAY SURGERY FS (CUSTOM PROCEDURE TRAY) ×3 IMPLANT
PENCIL BUTTON HOLSTER BLD 10FT (ELECTRODE) IMPLANT
SLEEVE SCD COMPRESS KNEE MED (MISCELLANEOUS) IMPLANT
SPONGE LAP 4X18 X RAY DECT (DISPOSABLE) ×3 IMPLANT
STRIP CLOSURE SKIN 1/2X4 (GAUZE/BANDAGES/DRESSINGS) IMPLANT
SUT MON AB 4-0 PC3 18 (SUTURE) ×3 IMPLANT
SUT VICRYL 3-0 CR8 SH (SUTURE) ×3 IMPLANT
SYR CONTROL 10ML LL (SYRINGE) ×3 IMPLANT
TOWEL OR 17X24 6PK STRL BLUE (TOWEL DISPOSABLE) ×3 IMPLANT
TOWEL OR NON WOVEN STRL DISP B (DISPOSABLE) ×1 IMPLANT

## 2017-02-18 NOTE — Discharge Instructions (Signed)
GENERAL SURGERY: POST OP INSTRUCTIONS ° °###################################################################### ° °EAT °Gradually transition to a high fiber diet with a fiber supplement over the next few weeks after discharge.  Start with a pureed / full liquid diet (see below) ° °WALK °Walk an hour a day.  Control your pain to do that.   ° °CONTROL PAIN °Control pain so that you can walk, sleep, tolerate sneezing/coughing, go up/down stairs. ° °HAVE A BOWEL MOVEMENT DAILY °Keep your bowels regular to avoid problems.  OK to try a laxative to override constipation.  OK to use an antidairrheal to slow down diarrhea.  Call if not better after 2 tries ° °CALL IF YOU HAVE PROBLEMS/CONCERNS °Call if you are still struggling despite following these instructions. °Call if you have concerns not answered by these instructions ° °###################################################################### ° ° ° °1. DIET: Follow a light bland diet the first 24 hours after arrival home, such as soup, liquids, crackers, etc.  Be sure to include lots of fluids daily.  Avoid fast food or heavy meals as your are more likely to get nauseated.   °2. Take your usually prescribed home medications unless otherwise directed. °3. PAIN CONTROL: °a. Pain is best controlled by a usual combination of three different methods TOGETHER: °i. Ice/Heat °ii. Over the counter pain medication °iii. Prescription pain medication °b. Most patients will experience some swelling and bruising around the incisions.  Ice packs or heating pads (30-60 minutes up to 6 times a day) will help. Use ice for the first few days to help decrease swelling and bruising, then switch to heat to help relax tight/sore spots and speed recovery.  Some people prefer to use ice alone, heat alone, alternating between ice & heat.  Experiment to what works for you.  Swelling and bruising can take several weeks to resolve.   °c. It is helpful to take an over-the-counter pain medication  regularly for the first few weeks.  Choose one of the following that works best for you: °i. Naproxen (Aleve, etc)  Two 220mg tabs twice a day °ii. Ibuprofen (Advil, etc) Three 200mg tabs four times a day (every meal & bedtime) °iii. Acetaminophen (Tylenol, etc) 500-650mg four times a day (every meal & bedtime) °d. A  prescription for pain medication (such as oxycodone, hydrocodone, etc) should be given to you upon discharge.  Take your pain medication as prescribed.  °i. If you are having problems/concerns with the prescription medicine (does not control pain, nausea, vomiting, rash, itching, etc), please call us [phone] to see if we need to switch you to a different pain medicine that will work better for you and/or control your side effect better. °ii. If you need a refill on your pain medication, please contact your pharmacy.  They will contact our office to request authorization. Prescriptions will not be filled after 5 pm or on week-ends. °4. Avoid getting constipated.  Between the surgery and the pain medications, it is common to experience some constipation.  Increasing fluid intake and taking a fiber supplement (such as Metamucil, Citrucel, FiberCon, MiraLax, etc) 1-2 times a day regularly will usually help prevent this problem from occurring.  A mild laxative (prune juice, Milk of Magnesia, MiraLax, etc) should be taken according to package directions if there are no bowel movements after 48 hours.   °5. Wash / shower every day.  You may shower over the dressings as they are waterproof.  Continue to shower over incision(s) after the dressing is off. °6. Remove your waterproof bandages   5 days after surgery.  You may leave the incision open to air.  You may have skin tapes (Steri Strips) covering the incision(s).  Leave them on until one week, then remove.  You may replace a dressing/Band-Aid to cover the incision for comfort if you wish.      7. ACTIVITIES as tolerated:   a. You may resume  regular (light) daily activities beginning the next day--such as daily self-care, walking, climbing stairs--gradually increasing activities as tolerated.  If you can walk 30 minutes without difficulty, it is safe to try more intense activity such as jogging, treadmill, bicycling, low-impact aerobics, swimming, etc. b. Save the most intensive and strenuous activity for last such as sit-ups, heavy lifting, contact sports, etc  Refrain from any heavy lifting or straining until you are off narcotics for pain control.   c. DO NOT PUSH THROUGH PAIN.  Let pain be your guide: If it hurts to do something, don't do it.  Pain is your body warning you to avoid that activity for another week until the pain goes down. d. You may drive when you are no longer taking prescription pain medication, you can comfortably wear a seatbelt, and you can safely maneuver your car and apply brakes. e. Dennis Bast may have sexual intercourse when it is comfortable.  8. FOLLOW UP in our office a. Please call [facility] at [phone] to set up an appointment to see your surgeon in the office for a follow-up appointment approximately 2-3 weeks after your surgery. b. Make sure that you call for this appointment the day you arrive home to insure a convenient appointment time. 9. IF YOU HAVE DISABILITY OR FAMILY LEAVE FORMS, BRING THEM TO THE OFFICE FOR PROCESSING.  DO NOT GIVE THEM TO YOUR DOCTOR.   WHEN TO CALL us [phone]: 1. Poor pain control 2. Reactions / problems with new medications (rash/itching, nausea, etc)  3. Fever over 101.5 F (38.5 C) 4. Worsening swelling or bruising 5. Continued bleeding from incision. 6. Increased pain, redness, or drainage from the incision 7. Difficulty breathing / swallowing   The clinic staff is available to answer your questions during regular business hours (8:30am-5pm).  Please dont hesitate to call and ask to speak to one of our nurses for clinical concerns.   If you have a medical emergency,  go to the nearest emergency room or call 911.  A surgeon from [facility] is always on call at the [facility], [place], [place], [place], [place]  [id number] ? MAIN: [phone] ? TOLL FREE: [phone] ?  FAX [phone] [email or web address]   Post Anesthesia Home Care Instructions  Activity: Get plenty of rest for the remainder of the day. A responsible adult should stay with you for 24 hours following the procedure.  For the next 24 hours, DO NOT: -Drive a car -Paediatric nurse -Drink alcoholic beverages -Take any medication unless instructed by your physician -Make any legal decisions or sign important papers.  Meals: Start with liquid foods such as gelatin or soup. Progress to regular foods as tolerated. Avoid greasy, spicy, heavy foods. If nausea and/or vomiting occur, drink only clear liquids until the nausea and/or vomiting subsides. Call your physician if vomiting continues.  Special Instructions/Symptoms: Your throat may feel dry or sore from the anesthesia or the breathing tube placed in your throat during surgery. If this causes discomfort, gargle with warm salt water. The discomfort should disappear within 24 hours.  If you had a scopolamine patch placed behind your ear for the management of post- operative nausea and/or vomiting: ° °1. The medication in the patch is effective for 72 hours, after which it should be removed.  Wrap patch in a tissue and discard in the trash. Wash hands thoroughly with soap and water. °2. You may remove the patch earlier than 72 hours if you experience unpleasant side effects which may include dry mouth, dizziness or visual disturbances. °3. Avoid touching the patch. Wash your hands with soap and water after contact with the patch. °  ° ° °

## 2017-02-18 NOTE — Anesthesia Postprocedure Evaluation (Signed)
Anesthesia Post Note  Patient: Vanessa [name]  Procedure(s) Performed: Procedure(s) (LRB): REMOVAL PORT-A-CATH (Right)  Patient location during evaluation: PACU Anesthesia Type: MAC Level of consciousness: awake and alert Pain management: pain level controlled Vital Signs Assessment: post-procedure vital signs reviewed and stable Respiratory status: spontaneous breathing, nonlabored ventilation, respiratory function stable and patient connected to nasal cannula oxygen Cardiovascular status: stable and blood pressure returned to baseline Anesthetic complications: no       Last Vitals:  Vitals:   02/18/17 1430 02/18/17 1446  BP: (!) 159/92 (!) 142/93  Pulse: (!) 101 86  Resp: 18 18  Temp:  36.6 C    Last Pain:  Vitals:   02/18/17 1449  TempSrc:   PainSc: 0-No pain                 Catalina Gravel

## 2017-02-18 NOTE — Op Note (Signed)
Preop diagnosis: Indwelling port a catheter for chemotherapy  Postop diagnosis: Same  Procedure: Removal of port a catheter  Surgeon: Lena Gores M.D.  Anesthesia: MAC with local  EBL: Minimal  Specimen none  Drains: None  Indications for procedure: The patient presents for removal of port a catheter after completing chemotherapy. The patient no longer requires central venous access. Risks of bleeding, infection, catheter fragmentation, embolization, arrhythmias and damage to arteries, veins and nerves and possibly other mediastinal structures discussed. The patient agrees to proceed.  Description of procedure: The patient was seen in the holding area. Questions were answered. The patient agreed to proceed. The patient was taken to the operating room. The patient was placed supine. Anesthesia was initiated. The skin on the upper chest was prepped and draped in a sterile fashion. Timeout was done. The patient received preoperative antibiotics. Incision was made through the old port site and the hub of the Port-A-Cath was seen. The sutures were cut to release the port from the chest wall. The catheter was removed in its entirety without difficulty. The tract was closed with 3-0 Vicryl. 4 Monocryl was used to close the skin. All final counts were correct. The patient was taken to recovery in satisfactory condition.  

## 2017-02-18 NOTE — Interval H&P Note (Signed)
History and Physical Interval Note:  02/18/2017 12:37 PM  Vanessa [name]  has presented today for surgery, with the diagnosis of port in place  The various methods of treatment have been discussed with the patient and family. After consideration of risks, benefits and other options for treatment, the patient has consented to  Procedure(s): REMOVAL PORT-A-CATH (N/A) as a surgical intervention .  The patient's history has been reviewed, patient examined, no change in status, stable for surgery.  I have reviewed the patient's chart and labs.  Questions were answered to the patient's satisfaction.     [name]

## 2017-02-18 NOTE — Transfer of Care (Signed)
Immediate Anesthesia Transfer of Care Note  Patient: Vanessa [name]  Procedure(s) Performed: Procedure(s): REMOVAL PORT-A-CATH (Right)  Patient Location: PACU  Anesthesia Type:MAC  Level of Consciousness: awake, alert  and oriented  Airway & Oxygen Therapy: Patient Spontanous Breathing  Post-op Assessment: Report given to RN and Post -op Vital signs reviewed and stable  Post vital signs: Reviewed and stable  Last Vitals:  Vitals:   02/18/17 1152 02/18/17 1349  BP: (!) 145/74 (!) 146/75  Pulse: 99 (!) 108  Resp: 18 (!) 29  Temp: 36.9 C 36.7 C    Last Pain:  Vitals:   02/18/17 1349  TempSrc:   PainSc: 0-No pain      Patients Stated Pain Goal: 0 (81/77/11 6579)  Complications: No apparent anesthesia complications

## 2017-02-18 NOTE — Anesthesia Preprocedure Evaluation (Addendum)
Anesthesia Evaluation  Patient identified by MRN, date of birth, ID band Patient awake    Reviewed: Allergy & Precautions, NPO status , Patient's Chart, lab work & pertinent test results  History of Anesthesia Complications (+) PONV and history of anesthetic complications  Airway Mallampati: II  TM Distance: >3 FB Neck ROM: Full    Dental  (+) Dental Advisory Given, Partial Upper   Pulmonary neg pulmonary ROS,    Pulmonary exam normal breath sounds clear to auscultation       Cardiovascular hypertension, Normal cardiovascular exam Rhythm:Regular Rate:Normal     Neuro/Psych PSYCHIATRIC DISORDERS Anxiety Trigeminal neuralgia   Neuromuscular disease    GI/Hepatic negative GI ROS, Neg liver ROS,   Endo/Other  Morbid obesity  Renal/GU negative Renal ROS     Musculoskeletal  (+) Arthritis , Osteoarthritis,    Abdominal   Peds  Hematology negative hematology ROS (+)   Anesthesia Other Findings Day of surgery medications reviewed with the patient.  Right breast cancer  Reproductive/Obstetrics                            Anesthesia Physical Anesthesia Plan  ASA: III  Anesthesia Plan: MAC   Post-op Pain Management:    Induction: Intravenous  Airway Management Planned: Nasal Cannula  Additional Equipment:   Intra-op Plan:   Post-operative Plan:   Informed Consent: I have reviewed the patients History and Physical, chart, labs and discussed the procedure including the risks, benefits and alternatives for the proposed anesthesia with the patient or authorized representative who has indicated his/her understanding and acceptance.   Dental advisory given  Plan Discussed with: CRNA and Anesthesiologist  Anesthesia Plan Comments: (Discussed risks/benefits/alternatives to MAC sedation including need for ventilatory support, hypotension, need for conversion to general anesthesia.  All  patient questions answered.  Patient/guardian wishes to proceed.)        Anesthesia Quick Evaluation

## 2017-02-18 NOTE — H&P (View-Only) (Signed)
ALAYZIA PAVLOCK 02/03/2017 10:36 AM Location: [facility] Patient #: [id number] DOB: Feb 25, 1961 Single / Language: [name] / Race: Black or African American Female  History of Present Illness Marcello Moores A. Angee Gupton MD; 02/03/2017 11:21 AM) Patient words: Patient returns for follow-up of stage II breast cancer treated one year ago with breast conservation therapy and postoperative chemotherapy. She has completed chemotherapy and radiation therapy. She has some right shoulder stiffness. Her port is still in place and she is anxious to have it removed. Otherwise, her energy is increasing and she slowly feeling better.                  CLINICAL DATA: 56 year old female with history of right breast cancer post lumpectomy 12/29/2015 followed by radiation therapy. Patient reports nonfocal pain involving the lumpectomy scar radiating towards the nipple. The patient describes this as a burning type pain.  EXAM: 2D DIGITAL DIAGNOSTIC BILATERAL MAMMOGRAM WITH CAD AND ADJUNCT TOMO  COMPARISON: Previous exam(s).  ACR Breast Density Category b: There are scattered areas of fibroglandular density.  FINDINGS: No suspicious masses or calcifications are seen in either breast. Postoperative changes are present in the upper-outer posterior right breast related to interval lumpectomy. Spot compression magnification MLO views of the lumpectomy site in the right breast were performed. There is no mammographic evidence of locally recurrent malignancy.  Mammographic images were processed with CAD.  IMPRESSION: 1. No mammographic evidence of malignancy in either breast.  2. No mammographic abnormalities to explain the nonfocal pain throughout the outer right breast. This pain is likely related to interval breast surgery and radiation therapy.  RECOMMENDATION: 1. Recommend further management of the right breast pain be based on clinical assessment.  2. Diagnostic  mammogram is suggested in 1 year. (Code:[id number]-B-01Y)  I have discussed the findings and recommendations with the patient. Results were also provided in writing at the conclusion of the visit. If applicable, a reminder letter will be sent to the patient regarding the next appointment.  BI-RADS CATEGORY 2: Benign.   Electronically Signed By: Everlean Alstrom M.D. On: 01/09/2017 08:41.  The patient is a 56 year old female.   Allergies Malachy Moan, [place]; 02/03/2017 10:37 AM) Penicillin G Potassium *PENICILLINS* Codeine Phosphate *ANALGESICS - OPIOID* Darvocet A500 *ANALGESICS - OPIOID* Demerol *ANALGESICS - OPIOID* Erythromycin *DERMATOLOGICALS* Flexeril *MUSCULOSKELETAL THERAPY AGENTS* Percocet *ANALGESICS - OPIOID* Sulfa 10 *OPHTHALMIC AGENTS*  Medication History Malachy Moan, RMA; 02/03/2017 10:37 AM) Ondansetron (4MG  Tablet Disint, Oral) Active. Vitamin D (Ergocalciferol) (50000UNIT Capsule, Oral) Active. Tylenol (325MG  Tablet, Oral) Active. Multiple Vitamin (Oral) Active. Diclofenac Sodium (75MG  Tablet DR, Oral) Active. Gabapentin (600MG  Tablet, Oral) Active. Medications Reconciled    Vitals Malachy Moan RMA; 02/03/2017 10:38 AM) 02/03/2017 10:37 AM Weight: 291 lb Height: 64in Body Surface Area: 2.29 m Body Mass Index: 49.95 kg/m  Temp.: 97.25F  Pulse: 92 (Regular)  BP: 150/90 (Sitting, Left Arm, Standard)      Physical Exam (Cailie Bosshart A. Lisette Mancebo MD; 02/03/2017 11:22 AM)  General Mental Status-Alert. General Appearance-Consistent with stated age. Hydration-Well hydrated. Voice-Normal.  Head and Neck Head-normocephalic, atraumatic with no lesions or palpable masses. Trachea-midline. Thyroid Gland Characteristics - normal size and consistency.  Breast Note: Port-A-Cath in place right upper chest region. Right breast status post radiation and surgical changes. No masses. Right arm range of motion is good  except for some stiffness at approximately 120. Left breast is normal.  Musculoskeletal Note: See above  Lymphatic Head & Neck  General Head & Neck Lymphatics: Bilateral - Description -  Normal. Axillary  General Axillary Region: Bilateral - Description - Normal. Tenderness - Non Tender.    Assessment & Plan (Jahni Nazar A. Ronita Hargreaves MD; 02/03/2017 11:22 AM)  POST-OPERATIVE STATE [phone]) Impression: Patient desires removal for poor. Risks, benefits and alternatives of surgery were discussed. We will consider her for port removal.  Current Plans I recommended surgery to remove the catheter. I explained the technique of removal with use of local anesthesia & possible need for more aggressive sedation/anesthesia for patient comfort.  Risks such as bleeding, infection, and other risks were discussed. Post-operative dressing/incision care was discussed. I noted a good likelihood this will help address the problem. We will work to minimize complications. Questions were answered. The patient expresses understanding & wishes to proceed with surgery.  You are being scheduled for surgery- Our schedulers will call you.  You should hear from our office's scheduling department within 5 working days about the location, date, and time of surgery. We try to make accommodations for patient's preferences in scheduling surgery, but sometimes the OR schedule or the surgeon's schedule prevents [place] from making those accommodations.  If you have not heard from our office [phone]) in 5 working days, call the office and ask for your surgeon's nurse.  If you have other questions about your diagnosis, plan, or surgery, call the office and ask for your surgeon's nurse.  HISTORY OF BREAST CANCER (Z85.3)  Current Plans Pt Education - [facility] Free Text Education/Instructions: discussed with patient and provided information.

## 2017-02-19 ENCOUNTER — Encounter (HOSPITAL_BASED_OUTPATIENT_CLINIC_OR_DEPARTMENT_OTHER): Payer: Self-pay | Admitting: Surgery

## 2017-02-20 ENCOUNTER — Other Ambulatory Visit: Payer: Self-pay

## 2017-02-20 MED ORDER — GABAPENTIN 300 MG PO CAPS: 900.0000 mg | ORAL_CAPSULE | Freq: Every day | ORAL | 1 refills | Status: DC

## 2017-02-24 ENCOUNTER — Ambulatory Visit: Payer: BLUE CROSS/BLUE SHIELD

## 2017-02-24 DIAGNOSIS — M25612 Stiffness of left shoulder, not elsewhere classified: Secondary | ICD-10-CM | POA: Diagnosis not present

## 2017-02-24 DIAGNOSIS — M25511 Pain in right shoulder: Secondary | ICD-10-CM | POA: Diagnosis not present

## 2017-02-24 DIAGNOSIS — M25512 Pain in left shoulder: Secondary | ICD-10-CM | POA: Diagnosis not present

## 2017-02-24 DIAGNOSIS — G8929 Other chronic pain: Secondary | ICD-10-CM

## 2017-02-24 DIAGNOSIS — M25611 Stiffness of right shoulder, not elsewhere classified: Secondary | ICD-10-CM | POA: Diagnosis not present

## 2017-02-24 NOTE — Therapy (Signed)
[place], [place], [address] Phone: [phone]   Fax:  [phone]  Physical Therapy Treatment  Patient Details  Name: Vanessa [name] MRN: [id number] Date of Birth: [birth year] Referring Provider: Dr. Erroll Luna  Encounter Date: 02/24/2017      PT End of Session - 02/24/17 0934    Visit Number 2   Number of Visits 16   Date for PT Re-Evaluation 04/10/17   PT Start Time 0849   PT Stop Time 0931   PT Time Calculation (min) 42 min   Activity Tolerance Patient tolerated treatment well   Behavior During Therapy [facility] for tasks assessed/performed      Past Medical History:  Diagnosis Date  . Anxiety   . Arthritis    knees  . Family history of breast cancer   . Family history of colon cancer   . Family history of thyroid cancer   . History of breast cancer 2017   right  . History of chemotherapy 2017  . History of trigeminal neuralgia   . PONV (postoperative nausea and vomiting)   . Wears partial dentures    upper    Past Surgical History:  Procedure Laterality Date  . BREAST LUMPECTOMY WITH RADIOACTIVE SEED AND SENTINEL LYMPH NODE BIOPSY Right 12/29/2015   Procedure: RIGHT BREAST  RADIOACTIVE SEED LOCALIZATION LUMPECTOMY AND RIGHT SENTINEL LYMPH NODE MAPPING;  Surgeon: Erroll Luna, MD;  Location: [place];  Service: General;  Laterality: Right;  . CYSTOSCOPY  06/28/2010  . KNEE ARTHROSCOPY Left 07/11/2011  . LESION EXCISION Bilateral 01/20/2014   upper and lower eyelids  . PORT-A-CATH REMOVAL Right 02/18/2017   Procedure: REMOVAL PORT-A-CATH;  Surgeon: Erroll Luna, MD;  Location: [place];  Service: General;  Laterality: Right;  . PORTACATH PLACEMENT N/A 12/29/2015   Procedure: INSERTION PORT-A-CATH;  Surgeon: Erroll Luna, MD;  Location: [place];  Service: General;  Laterality: N/A;  . TOTAL ABDOMINAL HYSTERECTOMY W/ BILATERAL  SALPINGOOPHORECTOMY  06/28/2010    There were no vitals filed for this visit.      Subjective Assessment - 02/24/17 0854    Subjective I haven't done my exercises yet because honestly I'm just afraid to use my arm. I'm afraid I'm going to hurt it. I had my port removed last week so that's still sore. My Lt shoulder started bothering me Saturday and thats been coming and going  since my surgery.   Pertinent History Right lumpectomy and sentinel node biopsy on 12/30/15 for HER2+ and estrogen receptive breast cancer. The one lymph node was negative. Chemo and radiation after surgery in 2017.    Patient Stated Goals Improve shoulder ROM   Currently in Pain? Yes   Pain Score 5    Pain Location Shoulder   Pain Orientation Right   Pain Descriptors / Indicators Aching   Pain Type Surgical pain   Pain Radiating Towards towards elbow   Pain Onset 1 to 4 weeks ago   Pain Frequency Constant   Aggravating Factors  overuse   Pain Relieving Factors pain meds   Multiple Pain Sites Yes   Pain Score 5   Pain Location Arm   Pain Orientation Left   Pain Descriptors / Indicators Aching   Pain Type Acute pain   Pain Onset In the past 7 days   Aggravating Factors  overuse, reaching too far   Pain Relieving Factors nothing really  [facility] PT Assessment - 02/24/17 0001      AROM   Right Shoulder Flexion 161 Degrees   Right Shoulder ABduction 161 Degrees   Right Shoulder Internal Rotation 40 Degrees                     [facility] Adult PT Treatment/Exercise - 02/24/17 0001      Shoulder Exercises: Supine   Horizontal ABduction AAROM;Right;5 reps  5 second hods with dowel into abduction   External Rotation AAROM;Right;5 reps  5 second holds with dowel   Flexion AAROM;Both;5 reps  5 second holds with dowel    Other Supine Exercises Fingers clasped behind head and open elbows for stretch 5 times, 5 second holds     Shoulder Exercises: Stretch   Corner Stretch 3 reps;10  seconds  In doorway   Internal Rotation Stretch 3 reps  With towel behind back, 5 second holds     Manual Therapy   Passive ROM In Supine with HOB slightly elevated: To Rt shoulder into flexion, abduction, er, and D2 all to pts tolerance                PT Education - 02/24/17 0910    Education provided Yes   Education Details Supine dowel AA/ROM exericses; doorway stretch and towel behind back IR stretch.   Person(s) Educated Patient   Methods Explanation;Demonstration;Handout   Comprehension Verbalized understanding;Returned demonstration           Short Term Clinic Goals - 02/13/17 1037      CC Short Term Goal  #1   Title Patient will be independent in her initial home exercise program.   Time 4   Period Weeks   Status New     CC Short Term Goal  #2   Title Increase right shoulder flexion to >/= 135 degrees for increased ease reaching overhead.   Time 4   Period Weeks   Status New     CC Short Term Goal  #3   Title Increase right shoulder abduction to >/= 135 degrees for increased ease reaching overhead.   Time 4   Period Weeks   Status New             Long Term Clinic Goals - 02/13/17 1039      CC Long Term Goal  #1   Title Patient will be independent in her home exercises program including a safe self-progression.   Time 8   Period Weeks   Status New     CC Long Term Goal  #2   Title Increase right shoulder flexion to >/= 150 degrees for increased ease reaching overhead.   Time 8   Period Weeks   Status New     CC Long Term Goal  #3   Title Increase right shoulder flexion to >/= 150 degrees for increased ease reaching overhead.   Time 8   Period Weeks   Status New     CC Long Term Goal  #4   Title Patient will be able to participate in a long-term exercise program including the [employer] program at the [facility] [employer] where she works.   Time 8   Period Weeks   Status New            Plan - 02/24/17 0935    Clinical Impression  Statement Pt hadn't performed any HEP as of this appointment as she reports was fearful of causing pain. She also had her port removed last  week which had increased her discomfort and added to her not wanting to move her Rt UE much. She had c/o Lt arm pain today that she says has been hurting since surgery due to using it more but the discomfort is becoming more consistent in recent days. Suggested she talk to her doctor regarding this and if they want [place] to incorporate in treatment to send [place] prescription for Lt UE.  Even though pt hasn't been doing hre HEP her A/ROM have improved greatly in flexion and abduction since hs ewas here last, she reports, due to just using it at work more. Her IR however was 10 degrees less so showed her how to stretch this at home as well. Pt reported feeling good, looser after session otday and this has encouraged her to become consistent with her HEP.    Rehab Potential Good   Clinical Impairments Affecting Rehab Potential Surgery was > 1 year ago   PT Frequency 2x / week   PT Duration 8 weeks   PT Treatment/Interventions ADLs/Self Care Home Management;Therapeutic activities;Therapeutic exercise;Manual techniques;Passive range of motion;Scar mobilization;Patient/family education   PT Next Visit Plan Begin pulleys and ball on wall exercises, review HEP prn, cont PROM right shoulder, ROM exercises, myofascial release right chest and axillary region   PT Home Exercise Plan Shoulder ROM   Consulted and Agree with Plan of Care Patient      Patient will benefit from skilled therapeutic intervention in order to improve the following deficits and impairments:  Decreased range of motion, Increased fascial restricitons, Impaired UE functional use, Pain, Decreased knowledge of precautions, Postural dysfunction, Decreased strength  Visit Diagnosis: Chronic right shoulder pain  Stiffness of right shoulder, not elsewhere classified     Problem List Patient Active Problem  List   Diagnosis Date Noted  . Port catheter in place 03/28/2016  . Chemotherapy-induced neuropathy ([place]) 03/14/2016  . Obesity, morbid, BMI 40.0-49.9 ([place]) 01/15/2016  . Genetic testing 12/26/2015  . Family history of breast cancer   . Family history of colon cancer   . Family history of thyroid cancer   . Breast cancer of upper-outer quadrant of right female breast ([place]) 11/22/2015  . Trigeminal neuralgia of left side of face 06/22/2014  . Essential hypertension 04/24/2014    Otelia Limes, PTA 02/24/2017, 9:41 AM  [place], [place], [address] Phone: [phone]   Fax:  [phone]  Name: Vanessa [name] MRN: [id number] Date of Birth: 1961-04-07

## 2017-02-24 NOTE — Patient Instructions (Addendum)
SHOULDER: Flexion - Supine (Cane)        Cancer Rehab [phone]    Hold cane in both hands. Raise arms up overhead. Do not allow back to arch. Hold _5__ seconds. Do __5-10__ times; __1-2__ times a day.   SELF ASSISTED WITH OBJECT: Shoulder Abduction / Adduction - Supine    Hold cane with both hands. Move both arms from side to side, keep elbows straight.  Hold when stretch felt for __5__ seconds. Repeat __5-10__ times; __1-2__ times a day. Once this becomes easier progress to third picture bringing affected arm towards ear by staying out to side. Same hold for _5_seconds. Repeat  _5-10_ times, _1-2_ times/day.  Shoulder Blade Stretch    Clasp fingers behind head with elbows touching in front of face. Pull elbows back while pressing shoulder blades together. Relax and hold as tolerated, can place pillow under elbow here for comfort as needed and to allow for prolonged stretch.  Repeat __5__ times. Do __1-2__ sessions per day.  SHOULDER: External Rotation - Supine (Cane)    Hold cane with both hands. Rotate arm away from body. Keep elbow on floor and next to body. _5__ reps per set, hold 5 seconds,  _2-3__ sets per day.  Add towel to keep elbow at side.  Copyright  VHI. All rights reserved.    Also add doorway stretch as demonstrated today holding 10 seconds, 3 times.  Towel behind back for Rt arm stretch starting with Rt hand at glut 5 second holds, 5 times

## 2017-02-25 ENCOUNTER — Ambulatory Visit (HOSPITAL_BASED_OUTPATIENT_CLINIC_OR_DEPARTMENT_OTHER)
Admission: RE | Admit: 2017-02-25 | Discharge: 2017-02-25 | Disposition: A | Discharge status: Home / Self Care | Payer: BLUE CROSS/BLUE SHIELD | Source: Ambulatory Visit | Attending: Cardiology | Admitting: Cardiology

## 2017-02-25 ENCOUNTER — Encounter (HOSPITAL_COMMUNITY): Payer: Self-pay

## 2017-02-25 ENCOUNTER — Ambulatory Visit (HOSPITAL_COMMUNITY)
Admission: RE | Admit: 2017-02-25 | Discharge: 2017-02-25 | Disposition: A | Discharge status: Home / Self Care | Payer: BLUE CROSS/BLUE SHIELD | Source: Ambulatory Visit | Attending: Cardiology | Admitting: Cardiology

## 2017-02-25 VITALS — BP 143/97 | HR 93 | Wt 286.8 lb

## 2017-02-25 DIAGNOSIS — C50411 Malignant neoplasm of upper-outer quadrant of right female breast: Secondary | ICD-10-CM

## 2017-02-25 DIAGNOSIS — I10 Essential (primary) hypertension: Secondary | ICD-10-CM | POA: Diagnosis not present

## 2017-02-25 NOTE — Progress Notes (Signed)
  Echocardiogram 2D Echocardiogram has been performed.  Darlina Sicilian M 02/25/2017, 9:58 AM

## 2017-02-26 NOTE — Progress Notes (Signed)
Patient ID: Vanessa [name], female   DOB: 10-21-1961, 56 y.o.   MRN: [id number]  Oncologist: Dr. Jana Hakim  56 yo with breast cancer presents for cardiology evaluation prior to starting Herceptin.  She was diagnosed with triple positive breast cancer in 12/16.  Plans are lumpectomy/lymph node dissection on 12/30/15 followed by abraxane/Herceptin x 12 weeks then Herceptin alone to complete 1 year.  She completed radiation in 7/17.  She has now completed Herceptin.   No exertional dyspnea or chest pain.   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    Labs (12/16): K 3.7, creatinine 0.8  PMH: 1. HTN: Not currently on meds.  2. Trigeminal neuralgia 3. TAH 4. Breast cancer: Diagnosed 12/16, ER+/PR+/HER2+.   - Echo (1/17) with EF 60-65%, lateral s' 11, GLS -23.1%, mild LVH, normal RV size and systolic function.  - Echo (4/17) with EF 60%, GLS -18.1%, normal RV size and systolic function.  - Echo (7/17) with EF 60-65%, GLS -16.8%.  - Echo (9/17) with EF 60-65%, mild LVh, GLS -14.8% (but do not think strain is accurate on this study), lateral s' 12.7 cm/sec.  - Echo (12/17) with EF 60-65%, mild LVH, GLS -15.5% (but difficult strain images), lateral s' 12.3 cm/sec.  - Echo (3/18) with EF 60-65%, GLS -19.4%, normal RV.   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   FH: Father with lung cancer, grandmother died from MI.  HTN, DM.   ROS: All systems reviewed and negative except as per HPI.   Current Outpatient Prescriptions  Medication Sig Dispense Refill  . acetaminophen (TYLENOL) 325 MG tablet Take 650 mg by mouth every 6 (six) hours as needed for mild pain or moderate pain. Reported on 04/18/2016    . b complex vitamins tablet Take 1 tablet by mouth daily. Reported on 06/14/2016    . cetirizine (ZYRTEC) 10 MG tablet Take 10 mg by mouth daily as needed for allergies. Reported on 06/14/2016    . diclofenac (CATAFLAM) 50 MG tablet Take 50  mg by mouth 2 (two) times daily.    Marland Kitchen gabapentin (NEURONTIN) 300 MG capsule Take 3 capsules (900 mg total) by mouth at bedtime. 90 capsule 1  . ibuprofen (ADVIL,MOTRIN) 800 MG tablet Take 1 tablet (800 mg total) by mouth every 8 (eight) hours as needed. 10 tablet 0  . Multiple Vitamins-Minerals (CENTRUM SILVER ADULT 50+ PO) Take 1 capsule by mouth daily. Reported on 03/14/2016    . tamoxifen (NOLVADEX) 20 MG tablet Take 1 tablet (20 mg total) by mouth daily. 30 tablet 3  . traMADol (ULTRAM) 50 MG tablet Take 1 tablet (50 mg total) by mouth 2 (two) times daily as needed. 50 tablet 0   No current facility-administered medications for this encounter.    BP (!) 143/97   Pulse 93   Wt 286 lb 12 oz (130.1 kg)   SpO2 100%   BMI 49.22 kg/m  General: NAD Neck: JVP 7 cm, no thyromegaly or thyroid nodule.  Lungs: Bilateral rhonchi and end expiratory wheezes.  CV: Nondisplaced PMI.  Heart regular S1/S2, no S3/S4, no murmur.  Trace ankle edema.  No carotid bruit.  Normal pedal pulses.  Abdomen: Soft, nontender, no hepatosplenomegaly, no distention.  Skin: Intact without lesions or rashes.  Neurologic: Alert and oriented x 3.  Psych: Normal affect. Extremities: No clubbing or cyanosis.  HEENT: Normal.   Assessment/Plan: 1. Breast cancer:  She has  finished Herceptin.  I reviewed today's echo and LV function and strain are stable.  She is finished with echoes at this point.  2. HTN: BP controlled.   Followup prn   Loralie Champagne 02/26/2017

## 2017-02-27 ENCOUNTER — Ambulatory Visit: Payer: BLUE CROSS/BLUE SHIELD

## 2017-02-27 DIAGNOSIS — M25611 Stiffness of right shoulder, not elsewhere classified: Secondary | ICD-10-CM | POA: Diagnosis not present

## 2017-02-27 DIAGNOSIS — M25512 Pain in left shoulder: Secondary | ICD-10-CM

## 2017-02-27 DIAGNOSIS — G8929 Other chronic pain: Secondary | ICD-10-CM | POA: Diagnosis not present

## 2017-02-27 DIAGNOSIS — M25612 Stiffness of left shoulder, not elsewhere classified: Secondary | ICD-10-CM

## 2017-02-27 DIAGNOSIS — M25511 Pain in right shoulder: Secondary | ICD-10-CM | POA: Diagnosis not present

## 2017-02-27 NOTE — Therapy (Signed)
[place], [place], [address] Phone: [phone]   Fax:  [phone]  Physical Therapy Re-Evaluation  Patient Details  Name: Vanessa [name] MRN: [id number] Date of Birth: 16-Oct-1961 Referring Provider: Dr. Erroll Luna  Encounter Date: 02/27/2017      PT End of Session - 02/27/17 0850    Visit Number 3   Number of Visits 16   Date for PT Re-Evaluation 04/10/17   PT Start Time 0803   PT Stop Time 0910   PT Time Calculation (min) 67 min   Activity Tolerance Patient tolerated treatment well   Behavior During Therapy [facility] for tasks assessed/performed      Past Medical History:  Diagnosis Date  . Anxiety   . Arthritis    knees  . Family history of breast cancer   . Family history of colon cancer   . Family history of thyroid cancer   . History of breast cancer 2017   right  . History of chemotherapy 2017  . History of trigeminal neuralgia   . PONV (postoperative nausea and vomiting)   . Wears partial dentures    upper    Past Surgical History:  Procedure Laterality Date  . BREAST LUMPECTOMY WITH RADIOACTIVE SEED AND SENTINEL LYMPH NODE BIOPSY Right 12/29/2015   Procedure: RIGHT BREAST  RADIOACTIVE SEED LOCALIZATION LUMPECTOMY AND RIGHT SENTINEL LYMPH NODE MAPPING;  Surgeon: Erroll Luna, MD;  Location: [place];  Service: General;  Laterality: Right;  . CYSTOSCOPY  06/28/2010  . KNEE ARTHROSCOPY Left 07/11/2011  . LESION EXCISION Bilateral 01/20/2014   upper and lower eyelids  . PORT-A-CATH REMOVAL Right 02/18/2017   Procedure: REMOVAL PORT-A-CATH;  Surgeon: Erroll Luna, MD;  Location: [facility];  Service: General;  Laterality: Right;  . PORTACATH PLACEMENT N/A 12/29/2015   Procedure: INSERTION PORT-A-CATH;  Surgeon: Erroll Luna, MD;  Location: [facility];  Service: General;  Laterality: N/A;  . TOTAL ABDOMINAL HYSTERECTOMY W/  BILATERAL SALPINGOOPHORECTOMY  06/28/2010    There were no vitals filed for this visit.       Subjective Assessment - 02/27/17 0806    Subjective I threw my backpack over my shoulder yesterday and I think I hurt my Rt shoulder. It just feels really tender to touch at the back of my shoulder and into my upper trap a little. I called the doctor about my Lt shoulder so hopefully that order will come soon.   Leone Payor, PT: Assessed left shoulder as we received new referral. Pt. report left shoulder pain worsened 02/22/17 when she was carrying a backpack. Pain with reaching overhead, lifting purse, and washing back.   Pertinent History Right lumpectomy and sentinel node biopsy on 12/30/15 for HER2+ and estrogen receptive breast cancer. The one lymph node was negative. Chemo and radiation after surgery in 2017.    Patient Stated Goals Improve shoulder ROM; lift purse, reach overhead, and wash back with left shoulder without pain   Currently in Pain? Yes   Pain Score 8    Pain Location Shoulder   Pain Orientation Right   Pain Descriptors / Indicators Throbbing;Aching;Sore  Deep   Pain Type Surgical pain   Pain Onset 1 to 4 weeks ago   Pain Frequency Constant   Aggravating Factors  maybe threw backpack over my shoulder wrong maybe??   Pain Relieving Factors pain meds   Multiple Pain Sites Yes   Pain Score 5   Pain Location Shoulder  Pain Orientation Left   Pain Descriptors / Indicators Sharp   Pain Type Acute pain   Pain Radiating Towards Left upper arm   Pain Onset In the past 7 days   Pain Frequency Intermittent   Aggravating Factors  Reaching overhead, lifting purse, washing back   Pain Relieving Factors rest            [facility] PT Assessment - 02/27/17 0001      AROM   AROM Assessment Site Shoulder   Right/Left Shoulder Right;Left   Right Shoulder Extension 52 Degrees   Right Shoulder Flexion 153 Degrees   Right Shoulder ABduction 139 Degrees   Right Shoulder Internal  Rotation 68 Degrees   Right Shoulder External Rotation 90 Degrees   Left Shoulder Extension 40 Degrees   Left Shoulder Flexion 142 Degrees   Left Shoulder ABduction 75 Degrees  Able to achieve 120 but very painful arc 75-120 degrees   Left Shoulder Internal Rotation 47 Degrees   Left Shoulder External Rotation 80 Degrees     Palpation   Palpation comment Painful palpation to left anterior and lateral shoulder at biceps insertion and supraspinatus region.     Special Tests    Special Tests Rotator Cuff Impingement   Rotator Cuff Impingment tests Empty Can test     Empty Can test   Findings Positive   Side Left   Comment Very painful to achieve proper positioning for test                   [facility] Adult PT Treatment/Exercise - 02/27/17 0001      Moist Heat Therapy   Number Minutes Moist Heat 15 Minutes   Moist Heat Location Shoulder  Lt      Electrical Stimulation   Electrical Stimulation Location Lt shoulder during heat   Electrical Stimulation Action IFC   Electrical Stimulation Parameters 80-150 Hz x 15 minutes   Electrical Stimulation Goals Pain     Manual Therapy   Soft tissue mobilization To Rt posterior shoulder, upper trap and subocciptal muscles as pt was guarding alot due to pain this morning.   Scapular Mobilization In Lt S/L into to Rt scapula protraction depression with abduction to UE throughout   Passive ROM In Supine with HOB slightly elevated: To Rt shoulder into flexion, abduction, er, and D2 all to pts tolerance                   Short Term Clinic Goals - 02/27/17 0841      CC Short Term Goal  #1   Title Patient will be independent in her initial home exercise program.   Time 4   Period Weeks   Status Achieved     CC Short Term Goal  #2   Title Increase right shoulder flexion to >/= 135 degrees for increased ease reaching overhead.   Time 4   Period Weeks   Status Achieved     CC Short Term Goal  #3   Title Increase right  shoulder abduction to >/= 135 degrees for increased ease reaching overhead.   Time 4   Period Weeks   Status Achieved             Long Term Clinic Goals - 02/27/17 2841      CC Long Term Goal  #1   Title Patient will be independent in her home exercises program including a safe self-progression.   Time 8   Period Weeks   Status  On-going     CC Long Term Goal  #2   Title Increase right shoulder flexion to >/= 150 degrees for increased ease reaching overhead.   Time 8   Period Weeks   Status Achieved     CC Long Term Goal  #3   Title Increase right shoulder abduction to >/= 150 degrees for increased ease reaching overhead.   Baseline Achieved 139 degrees today.   Time 8   Period Weeks   Status On-going     CC Long Term Goal  #4   Title Patient will be able to participate in a long-term exercise program including the [employer] program at the [facility] where she works.   Time 8   Period Weeks   Status On-going     CC Long Term Goal  #5   Title Patient will report >/= 50% reduction in left shoulder pain to allow for reaching overhead and for her purse.  Goal initiated 02/27/17   Time 4   Period Weeks   Status New     CC Long Term Goal  #6   Title Patient will report she is able to lift her purse and wash her back with >/= 50% less difficulty.  Goal initiated 02/27/17   Time 4   Period Weeks   Status New     CC Long Term Goal  #7   Title Increase left shoulder active flexion to >/= 120 degrees to reach overhead.  Goal initiated 02/27/17   Time 4   Period Weeks   Status New     Additional Goals   Additional Goals Yes            Plan - 02/27/17 0835    Clinical Impression Statement Leone Payor, PT: Pt. continues to have right shoulder tightness with upper trap trigger points but is making progress with ROM. Left shoulder is very painful with palpation and appears to be fairly typical flare up due to overuse while right shoulder heals. She will benefit from  physical therapy to reduce pain, restore normal ROM, and increase strength.   Rehab Potential Excellent   PT Frequency 2x / week   PT Duration 8 weeks   PT Treatment/Interventions ADLs/Self Care Home Management;Therapeutic activities;Therapeutic exercise;Manual techniques;Passive range of motion;Scar mobilization;Patient/family education;Iontophoresis '4mg'$ /ml Dexamethasone;Electrical Stimulation;Moist Heat;Cryotherapy  Ice, heat for left shoulder only   PT Next Visit Plan [place] for left shoulder if MD signs certification; begin ROM and cross friction massage for left shoulder; ice, heat, e-stim if needed for left shoulder pain; continue right shoulder treatment as today   Consulted and Agree with Plan of Care Patient      Patient will benefit from skilled therapeutic intervention in order to improve the following deficits and impairments:  Decreased range of motion, Increased fascial restricitons, Impaired UE functional use, Pain, Decreased knowledge of precautions, Postural dysfunction, Decreased strength  Visit Diagnosis: Chronic right shoulder pain - Plan: PT plan of care cert/re-cert  Stiffness of right shoulder, not elsewhere classified - Plan: PT plan of care cert/re-cert  Acute pain of left shoulder - Plan: PT plan of care cert/re-cert  Stiffness of left shoulder, not elsewhere classified - Plan: PT plan of care cert/re-cert     Problem List Patient Active Problem List   Diagnosis Date Noted  . Port catheter in place 03/28/2016  . Chemotherapy-induced neuropathy ([place]) 03/14/2016  . Obesity, morbid, BMI 40.0-49.9 ([place]) 01/15/2016  . Genetic testing 12/26/2015  . Family history of breast cancer   .  Family history of colon cancer   . Family history of thyroid cancer   . Breast cancer of upper-outer quadrant of right female breast ([place]) 11/22/2015  . Trigeminal neuralgia of left side of face 06/22/2014  . Essential hypertension 04/24/2014   Annia Friendly, PT 02/27/17  9:01 AM  Re-eval performed by Leone Payor, PT and treatment provided by Collie Siad, PTA today.  [place], [place], [address] Phone: [phone]   Fax:  [phone]  Name: Vanessa [name] MRN: [id number] Date of Birth: [birth year]

## 2017-03-03 ENCOUNTER — Ambulatory Visit: Payer: BLUE CROSS/BLUE SHIELD

## 2017-03-03 DIAGNOSIS — G8929 Other chronic pain: Secondary | ICD-10-CM | POA: Diagnosis not present

## 2017-03-03 DIAGNOSIS — M25511 Pain in right shoulder: Principal | ICD-10-CM

## 2017-03-03 DIAGNOSIS — M25512 Pain in left shoulder: Secondary | ICD-10-CM

## 2017-03-03 DIAGNOSIS — M25612 Stiffness of left shoulder, not elsewhere classified: Secondary | ICD-10-CM

## 2017-03-03 DIAGNOSIS — M25611 Stiffness of right shoulder, not elsewhere classified: Secondary | ICD-10-CM

## 2017-03-03 NOTE — Therapy (Signed)
[place], [place], [address] Phone: [phone]   Fax:  [phone]  Physical Therapy Treatment  Patient Details  Name: Vanessa [name] MRN: [id number] Date of Birth: 03/30/1961 Referring Provider: Dr. Erroll Luna  Encounter Date: 03/03/2017      PT End of Session - 03/03/17 0850    Visit Number 4   Number of Visits 16   Date for PT Re-Evaluation 04/10/17   PT Start Time 0804   PT Stop Time 0848   PT Time Calculation (min) 44 min   Activity Tolerance Patient tolerated treatment well   Behavior During Therapy [facility] for tasks assessed/performed      Past Medical History:  Diagnosis Date  . Anxiety   . Arthritis    knees  . Family history of breast cancer   . Family history of colon cancer   . Family history of thyroid cancer   . History of breast cancer 2017   right  . History of chemotherapy 2017  . History of trigeminal neuralgia   . PONV (postoperative nausea and vomiting)   . Wears partial dentures    upper    Past Surgical History:  Procedure Laterality Date  . BREAST LUMPECTOMY WITH RADIOACTIVE SEED AND SENTINEL LYMPH NODE BIOPSY Right 12/29/2015   Procedure: RIGHT BREAST  RADIOACTIVE SEED LOCALIZATION LUMPECTOMY AND RIGHT SENTINEL LYMPH NODE MAPPING;  Surgeon: Erroll Luna, MD;  Location: [place];  Service: General;  Laterality: Right;  . CYSTOSCOPY  06/28/2010  . KNEE ARTHROSCOPY Left 07/11/2011  . LESION EXCISION Bilateral 01/20/2014   upper and lower eyelids  . PORT-A-CATH REMOVAL Right 02/18/2017   Procedure: REMOVAL PORT-A-CATH;  Surgeon: Erroll Luna, MD;  Location: [place];  Service: General;  Laterality: Right;  . PORTACATH PLACEMENT N/A 12/29/2015   Procedure: INSERTION PORT-A-CATH;  Surgeon: Erroll Luna, MD;  Location: [place];  Service: General;  Laterality: N/A;  . TOTAL ABDOMINAL HYSTERECTOMY W/ BILATERAL  SALPINGOOPHORECTOMY  06/28/2010    There were no vitals filed for this visit.      Subjective Assessment - 03/03/17 0809    Subjective My Rt shoulder/scapula were sore after sessio Thursday but felt muchbetter over weekend. My Lt shoulder killed me! I don't know if it was all the cold and wet weather or what but it really bothered me. The heat and stim felt good on it last time. I haven't taken any pain meds either because sometimes I just don't want to take them!   Pertinent History Right lumpectomy and sentinel node biopsy on 12/30/15 for HER2+ and estrogen receptive breast cancer. The one lymph node was negative. Chemo and radiation after surgery in 2017.    Patient Stated Goals Improve shoulder ROM; lift purse, reach overhead, and wash back with left shoulder without pain   Currently in Pain? Yes   Pain Score 8    Pain Location Shoulder   Pain Orientation Left   Pain Descriptors / Indicators Jabbing;Other (Comment);Aching;Constant  "catch"   Pain Type Surgical pain   Pain Onset 1 to 4 weeks ago   Pain Frequency Constant   Aggravating Factors  it just always hurts   Pain Relieving Factors pain meds help some but Idon't like taking them                         [facility] Adult PT Treatment/Exercise - 03/03/17 0001  Shoulder Exercises: Pulleys   Flexion 2 minutes   Flexion Limitations VC to decrease Lt scapular compensations   ABduction 2 minutes   ABduction Limitations VC to modify ROM for Lt so as not to increase pain     Shoulder Exercises: Therapy Ball   Flexion 10 reps  With forward lean into end of stretch     Modalities   Modalities Iontophoresis     Iontophoresis   Type of Iontophoresis Dexamethasone   Location Lt lateral GH joint   Dose 40 mA/min   Time 4-6 hr wear     Manual Therapy   Passive ROM In Supine with HOB slightly elevated: To Lt shoulder into flexion, abduction, and er all to pts tolerance                   Short Term  Clinic Goals - 02/27/17 0841      CC Short Term Goal  #1   Title Patient will be independent in her initial home exercise program.   Time 4   Period Weeks   Status Achieved     CC Short Term Goal  #2   Title Increase right shoulder flexion to >/= 135 degrees for increased ease reaching overhead.   Time 4   Period Weeks   Status Achieved     CC Short Term Goal  #3   Title Increase right shoulder abduction to >/= 135 degrees for increased ease reaching overhead.   Time 4   Period Weeks   Status Achieved             Long Term Clinic Goals - 02/27/17 3235      CC Long Term Goal  #1   Title Patient will be independent in her home exercises program including a safe self-progression.   Time 8   Period Weeks   Status On-going     CC Long Term Goal  #2   Title Increase right shoulder flexion to >/= 150 degrees for increased ease reaching overhead.   Time 8   Period Weeks   Status Achieved     CC Long Term Goal  #3   Title Increase right shoulder abduction to >/= 150 degrees for increased ease reaching overhead.   Baseline Achieved 139 degrees today.   Time 8   Period Weeks   Status On-going     CC Long Term Goal  #4   Title Patient will be able to participate in a long-term exercise program including the [employer] program at the [employer] where she works.   Time 8   Period Weeks   Status On-going     CC Long Term Goal  #5   Title Patient will report >/= 50% reduction in left shoulder pain to allow for reaching overhead and for her purse.  Goal initiated 02/27/17   Time 4   Period Weeks   Status New     CC Long Term Goal  #6   Title Patient will report she is able to lift her purse and wash her back with >/= 50% less difficulty.  Goal initiated 02/27/17   Time 4   Period Weeks   Status New     CC Long Term Goal  #7   Title Increase left shoulder active flexion to >/= 120 degrees to reach overhead.  Goal initiated 02/27/17   Time 4   Period Weeks   Status New      Additional Goals   Additional Goals Yes  Plan - 03/03/17 0850    Clinical Impression Statement Pt reports her Rt shoulder continues to feel well and wanted to focus on Lt as it hurt her all weekend. So focused on Lt with P/ROM today and started iontophoresis to Lt as cert has been signed. Pt tolerated stretching well and did not report increase pain after though did require alot of encouragment to relax during P/ROM today.    Rehab Potential Excellent   Clinical Impairments Affecting Rehab Potential Surgery was > 1 year ago   PT Frequency 2x / week   PT Duration 8 weeks   PT Treatment/Interventions ADLs/Self Care Home Management;Therapeutic activities;Therapeutic exercise;Manual techniques;Passive range of motion;Scar mobilization;Patient/family education;Iontophoresis '4mg'$ /ml Dexamethasone;Electrical Stimulation;Moist Heat;Cryotherapy  Ice, heat for Lt shoulder only   PT Next Visit Plan Ionto for left shoulder; cont ROM and cross friction massage for left shoulder; ice, heat, e-stim if needed for left shoulder pain; continue right shoulder treatment as today   Consulted and Agree with Plan of Care Patient      Patient will benefit from skilled therapeutic intervention in order to improve the following deficits and impairments:  Decreased range of motion, Increased fascial restricitons, Impaired UE functional use, Pain, Decreased knowledge of precautions, Postural dysfunction, Decreased strength  Visit Diagnosis: Chronic right shoulder pain  Stiffness of right shoulder, not elsewhere classified  Acute pain of left shoulder  Stiffness of left shoulder, not elsewhere classified     Problem List Patient Active Problem List   Diagnosis Date Noted  . Port catheter in place 03/28/2016  . Chemotherapy-induced neuropathy ([place]) 03/14/2016  . Obesity, morbid, BMI 40.0-49.9 ([place]) 01/15/2016  . Genetic testing 12/26/2015  . Family history of breast cancer   . Family  history of colon cancer   . Family history of thyroid cancer   . Breast cancer of upper-outer quadrant of right female breast ([place]) 11/22/2015  . Trigeminal neuralgia of left side of face 06/22/2014  . Essential hypertension 04/24/2014    Otelia Limes, PTA 03/03/2017, 8:54 AM  [place], [place], [address] Phone: [phone]   Fax:  [phone]  Name: SHUNDRA WIRSING MRN: [id number] Date of Birth: 08/26/1961

## 2017-03-05 ENCOUNTER — Encounter: Payer: Self-pay | Admitting: Physical Therapy

## 2017-03-05 ENCOUNTER — Ambulatory Visit: Payer: BLUE CROSS/BLUE SHIELD | Admitting: Physical Therapy

## 2017-03-05 DIAGNOSIS — M25511 Pain in right shoulder: Secondary | ICD-10-CM | POA: Diagnosis not present

## 2017-03-05 DIAGNOSIS — G8929 Other chronic pain: Secondary | ICD-10-CM

## 2017-03-05 DIAGNOSIS — M25611 Stiffness of right shoulder, not elsewhere classified: Secondary | ICD-10-CM | POA: Diagnosis not present

## 2017-03-05 DIAGNOSIS — M25612 Stiffness of left shoulder, not elsewhere classified: Secondary | ICD-10-CM

## 2017-03-05 DIAGNOSIS — M25512 Pain in left shoulder: Secondary | ICD-10-CM | POA: Diagnosis not present

## 2017-03-05 NOTE — Therapy (Signed)
[place], [place], [address] Phone: [phone]   Fax:  [phone]  Physical Therapy Treatment  Patient Details  Name: Vanessa [name] MRN: [id number] Date of Birth: 02/22/1961 Referring Provider: Dr. Erroll Luna  Encounter Date: 03/05/2017      PT End of Session - 03/05/17 0848    Visit Number 5   Number of Visits 16   Date for PT Re-Evaluation 04/10/17   PT Start Time 0808   PT Stop Time 0847   PT Time Calculation (min) 39 min   Activity Tolerance Patient tolerated treatment well   Behavior During Therapy [facility] for tasks assessed/performed      Past Medical History:  Diagnosis Date  . Anxiety   . Arthritis    knees  . Family history of breast cancer   . Family history of colon cancer   . Family history of thyroid cancer   . History of breast cancer 2017   right  . History of chemotherapy 2017  . History of trigeminal neuralgia   . PONV (postoperative nausea and vomiting)   . Wears partial dentures    upper    Past Surgical History:  Procedure Laterality Date  . BREAST LUMPECTOMY WITH RADIOACTIVE SEED AND SENTINEL LYMPH NODE BIOPSY Right 12/29/2015   Procedure: RIGHT BREAST  RADIOACTIVE SEED LOCALIZATION LUMPECTOMY AND RIGHT SENTINEL LYMPH NODE MAPPING;  Surgeon: Erroll Luna, MD;  Location: [place];  Service: General;  Laterality: Right;  . CYSTOSCOPY  06/28/2010  . KNEE ARTHROSCOPY Left 07/11/2011  . LESION EXCISION Bilateral 01/20/2014   upper and lower eyelids  . PORT-A-CATH REMOVAL Right 02/18/2017   Procedure: REMOVAL PORT-A-CATH;  Surgeon: Erroll Luna, MD;  Location: [place];  Service: General;  Laterality: Right;  . PORTACATH PLACEMENT N/A 12/29/2015   Procedure: INSERTION PORT-A-CATH;  Surgeon: Erroll Luna, MD;  Location: [place];  Service: General;  Laterality: N/A;  . TOTAL ABDOMINAL HYSTERECTOMY W/ BILATERAL  SALPINGOOPHORECTOMY  06/28/2010    There were no vitals filed for this visit.      Subjective Assessment - 03/05/17 0809    Subjective I have pain in my right elbow area especially when using the computer at work. It was when I threw the backpack over my left shoulder that I started having problems on the left side.    Pertinent History Right lumpectomy and sentinel node biopsy on 12/30/15 for HER2+ and estrogen receptive breast cancer. The one lymph node was negative. Chemo and radiation after surgery in 2017.    Patient Stated Goals Improve shoulder ROM; lift purse, reach overhead, and wash back with left shoulder without pain   Currently in Pain? Yes   Pain Score 6    Pain Location Shoulder   Pain Orientation Left   Pain Descriptors / Indicators Jabbing;Sharp   Pain Type Surgical pain   Pain Onset 1 to 4 weeks ago                         [facility] Adult PT Treatment/Exercise - 03/05/17 0001      Shoulder Exercises: Pulleys   Flexion 2 minutes   ABduction 2 minutes     Shoulder Exercises: Therapy Ball   Flexion 10 reps  With forward lean into end of stretch     Modalities   Modalities Iontophoresis     Iontophoresis   Type of Iontophoresis Dexamethasone   Location Lt  lateral GH joint   Dose 40 mA/min   Time 4-6 hr wear     Manual Therapy   Soft tissue mobilization cross friction massage to left shoulder at attachment of rotator cuff   Passive ROM In Supine: To Lt shoulder into flexion, abduction, and er all to pts tolerance, pt had most difficulty with abduction                PT Education - 03/05/17 1028    Education provided Yes   Education Details nerve stretches for median and ulnar nerves   Person(s) Educated Patient   Methods Explanation;Demonstration   Comprehension Verbalized understanding;Returned demonstration           Short Term Clinic Goals - 03/05/17 0824      CC Short Term Goal  #1   Title Patient will be independent in  her initial home exercise program.   Time 4   Period Weeks   Status Achieved     CC Short Term Goal  #2   Title Increase right shoulder flexion to >/= 135 degrees for increased ease reaching overhead.   Time 4   Period Weeks   Status Achieved     CC Short Term Goal  #3   Title Increase right shoulder abduction to >/= 135 degrees for increased ease reaching overhead.   Time 4   Period Weeks   Status Achieved             Long Term Clinic Goals - 03/05/17 2703      CC Long Term Goal  #1   Title Patient will be independent in her home exercises program including a safe self-progression.   Time 8   Period Weeks   Status On-going     CC Long Term Goal  #2   Title Increase right shoulder flexion to >/= 150 degrees for increased ease reaching overhead.   Time 8   Period Weeks   Status Achieved     CC Long Term Goal  #3   Title Increase right shoulder abduction to >/= 150 degrees for increased ease reaching overhead.   Baseline Achieved 139 degrees today, 03/05/17- 160   Time 8   Period Weeks   Status Achieved     CC Long Term Goal  #4   Title Patient will be able to participate in a long-term exercise program including the [employer] program at the [employer] where she works.   Time 8   Period Weeks   Status On-going     CC Long Term Goal  #5   Title Patient will report >/= 50% reduction in left shoulder pain to allow for reaching overhead and for her purse.   Baseline 03/05/17- 20% improvement   Time 4   Period Weeks   Status On-going     CC Long Term Goal  #6   Title Patient will report she is able to lift her purse and wash her back with >/= 50% less difficulty.   Baseline 03/05/17- 0% less difficulty unchanged   Time 4   Period Weeks   Status On-going     CC Long Term Goal  #7   Title Increase left shoulder active flexion to >/= 120 degrees to reach overhead.   Baseline 03/05/17- 80 degrees with pain   Time 4   Period Weeks   Status On-going             Plan - 03/05/17 0848    Clinical Impression Statement  Pt reports her right shoulder is feeling better but she is having pain at posterior medial elbow especially when reaching her arm out. She is very tender to touch in this area and may benefit from soft tissue mobilization to this area. She tolerated passive flexion and external rotation well but has difficulty with pain when left arm is moved passively into abduction. Continued with ionto today because pt states that helped last session and she was able to sleep through the night. Instructed pt in nerve stretches for medial and ulnar nerves to see if that would help decrease pain around right posterior medial elbow.    Rehab Potential Excellent   Clinical Impairments Affecting Rehab Potential Surgery was > 1 year ago   PT Frequency 2x / week   PT Duration 8 weeks   PT Treatment/Interventions ADLs/Self Care Home Management;Therapeutic activities;Therapeutic exercise;Manual techniques;Passive range of motion;Scar mobilization;Patient/family education;Iontophoresis 80m/ml Dexamethasone;Electrical Stimulation;Moist Heat;Cryotherapy   PT Next Visit Plan Ionto for left shoulder; cont ROM and cross friction massage for left shoulder; ice, heat, e-stim if needed for left shoulder pain; soft tissue/myofascial to painful area in right posterior medial elbow   PT Home Exercise Plan Shoulder ROM   Consulted and Agree with Plan of Care Patient      Patient will benefit from skilled therapeutic intervention in order to improve the following deficits and impairments:  Decreased range of motion, Increased fascial restricitons, Impaired UE functional use, Pain, Decreased knowledge of precautions, Postural dysfunction, Decreased strength  Visit Diagnosis: Chronic right shoulder pain  Stiffness of right shoulder, not elsewhere classified  Acute pain of left shoulder  Stiffness of left shoulder, not elsewhere classified     Problem List Patient Active  Problem List   Diagnosis Date Noted  . Port catheter in place 03/28/2016  . Chemotherapy[place]induced [place] ([place] 03/14/2016  . Obesity, morbid, BMI 40.0-49.9 ([place] 01/15/2016  . Genetic testing 12/26/2015  . Family history of breast cancer   . Family history of colon cancer   . Family history of thyroid cancer   . Breast cancer of upper-outer quadrant of right female breast ([place] 11/22/2015  . Trigeminal neuralgia of left side of face 06/22/2014  . Essential hypertension 04/24/2014    Vanessa SabalBAtlanticare Regional Medical Center [facility] Main[facility] Division3/28/2018, 10:29 AM  [place] [address]Phone: [phone]  Fax:  [phone] Name: Vanessa NEWHARDMRN: [id number] of Birth: [birth year] BManus [name] PT 03/05/17 10:29 AM

## 2017-03-06 ENCOUNTER — Ambulatory Visit (HOSPITAL_BASED_OUTPATIENT_CLINIC_OR_DEPARTMENT_OTHER): Payer: BLUE CROSS/BLUE SHIELD | Admitting: Oncology

## 2017-03-06 ENCOUNTER — Other Ambulatory Visit: Payer: Self-pay | Admitting: Adult Health

## 2017-03-06 ENCOUNTER — Other Ambulatory Visit (HOSPITAL_BASED_OUTPATIENT_CLINIC_OR_DEPARTMENT_OTHER): Payer: BLUE CROSS/BLUE SHIELD

## 2017-03-06 VITALS — BP 131/75 | HR 88 | Temp 98.5°F | Resp 20 | Ht 64.0 in | Wt 287.3 lb

## 2017-03-06 DIAGNOSIS — C50411 Malignant neoplasm of upper-outer quadrant of right female breast: Secondary | ICD-10-CM

## 2017-03-06 DIAGNOSIS — Z79810 Long term (current) use of selective estrogen receptor modulators (SERMs): Secondary | ICD-10-CM

## 2017-03-06 DIAGNOSIS — N951 Menopausal and female climacteric states: Secondary | ICD-10-CM | POA: Diagnosis not present

## 2017-03-06 DIAGNOSIS — Z170 Estrogen receptor positive status [ER+]: Secondary | ICD-10-CM

## 2017-03-06 LAB — COMPREHENSIVE METABOLIC PANEL
ALBUMIN: 3.3 g/dL — AB (ref 3.5–5.0)
ALK PHOS: 80 U/L (ref 40–150)
ALT: 20 U/L (ref 0–55)
AST: 20 U/L (ref 5–34)
Anion Gap: 7 mEq/L (ref 3–11)
BUN: 12.6 mg/dL (ref 7.0–26.0)
CO2: 28 mEq/L (ref 22–29)
Calcium: 8.9 mg/dL (ref 8.4–10.4)
Chloride: 108 mEq/L (ref 98–109)
Creatinine: 0.7 mg/dL (ref 0.6–1.1)
GLUCOSE: 95 mg/dL (ref 70–140)
POTASSIUM: 3.6 meq/L (ref 3.5–5.1)
Sodium: 143 mEq/L (ref 136–145)
TOTAL PROTEIN: 6.9 g/dL (ref 6.4–8.3)
Total Bilirubin: 0.41 mg/dL (ref 0.20–1.20)

## 2017-03-06 LAB — CBC WITH DIFFERENTIAL/PLATELET
BASO%: 0.5 % (ref 0.0–2.0)
BASOS ABS: 0 10*3/uL (ref 0.0–0.1)
EOS ABS: 0.2 10*3/uL (ref 0.0–0.5)
EOS%: 5.2 % (ref 0.0–7.0)
HEMATOCRIT: 38.2 % (ref 34.8–46.6)
HEMOGLOBIN: 12.3 g/dL (ref 11.6–15.9)
LYMPH#: 1.9 10*3/uL (ref 0.9–3.3)
LYMPH%: 49.4 % (ref 14.0–49.7)
MCH: 27.5 pg (ref 25.1–34.0)
MCHC: 32.2 g/dL (ref 31.5–36.0)
MCV: 85.5 fL (ref 79.5–101.0)
MONO#: 0.3 10*3/uL (ref 0.1–0.9)
MONO%: 6.7 % (ref 0.0–14.0)
NEUT%: 38.2 % — ABNORMAL LOW (ref 38.4–76.8)
NEUTROS ABS: 1.5 10*3/uL (ref 1.5–6.5)
Platelets: 229 10*3/uL (ref 145–400)
RBC: 4.47 10*6/uL (ref 3.70–5.45)
RDW: 13.7 % (ref 11.2–14.5)
WBC: 3.9 10*3/uL (ref 3.9–10.3)

## 2017-03-06 MED ORDER — GABAPENTIN 600 MG PO TABS: 600.0000 mg | ORAL_TABLET | Freq: Every day | ORAL | 4 refills | Status: DC

## 2017-03-06 MED ORDER — GABAPENTIN 300 MG PO CAPS: ORAL_CAPSULE | ORAL | 1 refills | Status: DC

## 2017-03-06 MED ORDER — TAMOXIFEN CITRATE 20 MG PO TABS: 20.0000 mg | ORAL_TABLET | Freq: Every day | ORAL | 3 refills | Status: DC

## 2017-03-06 MED ORDER — TRAMADOL HCL 50 MG PO TABS: 50.0000 mg | ORAL_TABLET | Freq: Two times a day (BID) | ORAL | 0 refills | Status: DC | PRN

## 2017-03-06 NOTE — Progress Notes (Signed)
[name]  [phone] [phone]     ID: Vanessa [name] DOB: [birth year]  MR#: [id number]  WTU#:[id number]  Patient Care Team: Aretta Nip, MD as PCP - General (Family Medicine) Erroll Luna, MD as Consulting Physician (General Surgery) Chauncey Cruel, MD as Consulting Physician (Oncology) Marcial Pacas, MD as Consulting Physician (Neurology) Gean Birchwood, DPM as Consulting Physician (Podiatry) Servando Salina, MD as Consulting Physician (Obstetrics and Gynecology) Eppie Gibson, MD as Attending Physician (Radiation Oncology) Larey Dresser, MD as Consulting Physician (Cardiology) PCP: Aretta Nip, MD OTHER MD:  CHIEF COMPLAINT: Triple positive breast cancer  CURRENT TREATMENT: trastuzumab, tamoxifen   BREAST CANCER HISTORY: From the original intake note:  [name] had screening mammography at Dr. cousins office on 11/06/2015 suggesting a change in the right breast. She was scheduled for right diagnostic mammography and tomosynthesis with ultrasonography at the [facility] 11/13/2015. The breast density was category B. In the upper outer quadrant of the right breast there was a small irregular mass with suspicious internal calcifications. By exam this was small, mobile, firm, and located at the 10:00 position 7 cm from the nipple. Ultrasound of the right breast confirmed an irregularly marginated hypoechoic mass measuring 1.1 cm. The right axilla was sonographically benign.  Biopsy of the right breast mass in question 11/15/2015 showed ([id number]) an invasive ductal carcinoma, grade 2, estrogen receptor 100% positive, progesterone receptor 2% positive, both with strong staining intensity, with an MIB-1 of 70%, and HER-2 amplification, the signals ratio being 4.92 and the number per cell 17.45.  The patient's subsequent history is as detailed below.  INTERVAL HISTORY: Neko returns today for follow-up of her triple  positive breast cancer. She continues on tamoxifen, generally with good tolerance, although she still has significant nighttime hot flashes despite taking Neurontin at that time. Vaginal wetness is not a major issue. She obtains a drug at a good price.  REVIEW OF SYSTEMS: [name] and is currently undergoing rehabilitation for the right upper extremity range of motion and unfortunately also for her left shoulder which she has an overuse injury in. She still working full time however. She had her port removed. She had a urine infection reasons. That is improving. Aside from these issues a detailed review of systems today was stable  PAST MEDICAL HISTORY: Past Medical History:  Diagnosis Date  . Anxiety   . Arthritis    knees  . Family history of breast cancer   . Family history of colon cancer   . Family history of thyroid cancer   . History of breast cancer 2017   right  . History of chemotherapy 2017  . History of trigeminal neuralgia   . PONV (postoperative nausea and vomiting)   . Wears partial dentures    upper    PAST SURGICAL HISTORY: Past Surgical History:  Procedure Laterality Date  . BREAST LUMPECTOMY WITH RADIOACTIVE SEED AND SENTINEL LYMPH NODE BIOPSY Right 12/29/2015   Procedure: RIGHT BREAST  RADIOACTIVE SEED LOCALIZATION LUMPECTOMY AND RIGHT SENTINEL LYMPH NODE MAPPING;  Surgeon: Erroll Luna, MD;  Location: [place] [facility];  Service: General;  Laterality: Right;  . CYSTOSCOPY  06/28/2010  . KNEE ARTHROSCOPY Left 07/11/2011  . LESION EXCISION Bilateral 01/20/2014   upper and lower eyelids  . PORT-A-CATH REMOVAL Right 02/18/2017   Procedure: REMOVAL PORT-A-CATH;  Surgeon: Erroll Luna, MD;  Location: [facility];  Service: General;  Laterality: Right;  . PORTACATH PLACEMENT N/A 12/29/2015  Procedure: INSERTION PORT-A-CATH;  Surgeon: Harriette Bouillon, MD;  Location: [facility];  Service: General;  Laterality: N/A;  . TOTAL ABDOMINAL  HYSTERECTOMY W/ BILATERAL SALPINGOOPHORECTOMY  06/28/2010    FAMILY HISTORY Family History  Problem Relation Age of Onset  . Diabetes Mother   . Hypertension Mother   . Asthma Daughter   . Diabetes Sister   . Hypertension Sister   . Colon cancer Sister 46  . Thyroid cancer Sister 10  . Lung cancer Father   . Breast cancer Cousin     maternal first cousin  . Breast cancer Cousin 64    maternal first cousin - inflammatory   the patient's father died from lung cancer at the age of 38, in the setting of tobacco abuse. The patient's mother is living, at age 56. The patient had 2 brothers, 3 sisters. One sister was diagnosed with colon cancer in her 10s and later thyroid cancer. She died from metastatic disease. One brother died with pulmonary problems. On the maternal side a cousin was diagnosed with inflammatory breast cancer at the age of 37. A second cousin on the maternal side was diagnosed with breast cancer in her early 50s. There is no history of ovarian cancer in the family.  GYNECOLOGIC HISTORY:  No LMP recorded. Patient is postmenopausal. Menarche age 56, first live birth age 29, the patient is GX P2. She underwent simple hysterectomy with bilateral salpingectomy 06/28/2010, with benign pathology([id number]). She still has her ovaries in place. 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 She describes herself is single. At home she lives with her daughter Vanessa [name], who teaches theater at [employer] middle school and is currently studying towards a Masters in counseling; and her son Vanessa [name], who is a professional basketball player in the international circuit (currently with the [employer]). The patient has no grandchildren. She attends a [employer]    ADVANCED DIRECTIVES: Not in place. At the initial clinic visit 03/06/2017 the patient was given the  appropriate forms to complete and notarize at her discretion.  HEALTH MAINTENANCE: Social History  Substance Use Topics  . Smoking status: Never Smoker  . Smokeless tobacco: Never Used  . Alcohol use No     Colonoscopy: 2016/[facility]  PAP:  Bone density:  Lipid panel:  Allergies  Allergen Reactions  . Amoxicillin-Pot Clavulanate Anaphylaxis  . Penicillins Anaphylaxis       . Adhesive [Tape] Other (See Comments)    TEARS SKIN  . Hydrocodone Itching    CAN TAKE WITH BENADRYL  . Meperidine Hives and Swelling  . Cefaclor Rash  . Codeine Rash  . Darvocet [Propoxyphene N-Acetaminophen] Rash  . Erythromycin Rash  . Flexeril [Cyclobenzaprine Hcl] Rash  . Percocet [Oxycodone-Acetaminophen] Itching and Rash  . Sulfa Antibiotics Rash    Current Outpatient Prescriptions  Medication Sig Dispense Refill  . acetaminophen (TYLENOL) 325 MG tablet Take 650 mg by mouth every 6 (six) hours as needed for mild pain or moderate pain. Reported on 04/18/2016    . b complex vitamins tablet Take 1 tablet by mouth daily. Reported on 06/14/2016    . cetirizine (ZYRTEC) 10 MG tablet Take 10 mg by mouth daily as needed for allergies. Reported on 06/14/2016    . diclofenac (CATAFLAM) 50 MG tablet Take 50 mg by mouth 2 (two) times daily.    Marland Kitchen gabapentin (NEURONTIN) 300 MG capsule Take 3  capsules (900 mg total) by mouth at bedtime. 90 capsule 1  . ibuprofen (ADVIL,MOTRIN) 800 MG tablet Take 1 tablet (800 mg total) by mouth every 8 (eight) hours as needed. 10 tablet 0  . Multiple Vitamins-Minerals (CENTRUM SILVER ADULT 50+ PO) Take 1 capsule by mouth daily. Reported on 03/14/2016    . tamoxifen (NOLVADEX) 20 MG tablet Take 1 tablet (20 mg total) by mouth daily. 30 tablet 3  . traMADol (ULTRAM) 50 MG tablet Take 1 tablet (50 mg total) by mouth 2 (two) times daily as needed. 50 tablet 0   No current facility-administered medications for this visit.     OBJECTIVE: Morbidly obese African-American woman who appears  stated age Vitals:   03/06/17 0935  BP: 131/75  Pulse: 88  Resp: 20  Temp: 98.5 F (36.9 C)     Body mass index is 49.31 kg/m.    ECOG FS:1 - Symptomatic but completely ambulatory  Sclerae unicteric, EOMs intact Oropharynx clear and moist No cervical or supraclavicular adenopathy Lungs no rales or rhonchi Heart regular rate and rhythm Abd soft, nontender, positive bowel sounds MSK no focal spinal tenderness, no upper extremity lymphedema Neuro: nonfocal, well oriented, appropriate affect Breasts: The right breast is status post lumpectomy followed by radiation. There is no evidence of local recurrence. Left breast is unremarkable. Both axillae are benign   LAB RESULTS:  CMP     Component Value Date/Time   NA 142 12/05/2016 0822   K 3.6 12/05/2016 0822   CL 106 12/20/2015 1120   CO2 25 12/05/2016 0822   GLUCOSE 97 12/05/2016 0822   BUN 14.1 12/05/2016 0822   CREATININE 0.7 12/05/2016 0822   CALCIUM 8.9 12/05/2016 0822   PROT 7.6 12/05/2016 0822   ALBUMIN 3.4 (L) 12/05/2016 0822   AST 19 12/05/2016 0822   ALT 16 12/05/2016 0822   ALKPHOS 96 12/05/2016 0822   BILITOT 0.27 12/05/2016 0822   GFRNONAA >60 12/20/2015 1120   GFRAA >60 12/20/2015 1120    INo results found for: SPEP, UPEP  Lab Results  Component Value Date   WBC 3.9 03/06/2017   NEUTROABS 1.5 03/06/2017   HGB 12.3 03/06/2017   HCT 38.2 03/06/2017   MCV 85.5 03/06/2017   PLT 229 03/06/2017      Chemistry      Component Value Date/Time   NA 142 12/05/2016 0822   K 3.6 12/05/2016 0822   CL 106 12/20/2015 1120   CO2 25 12/05/2016 0822   BUN 14.1 12/05/2016 0822   CREATININE 0.7 12/05/2016 0822      Component Value Date/Time   CALCIUM 8.9 12/05/2016 0822   ALKPHOS 96 12/05/2016 0822   AST 19 12/05/2016 0822   ALT 16 12/05/2016 0822   BILITOT 0.27 12/05/2016 0822      No results found for: LABCA2  No components found for: NOMVE720  No results for input(s): INR in the last 168  hours.  Urinalysis No results found for: COLORURINE, APPEARANCEUR, LABSPEC, PHURINE, GLUCOSEU, HGBUR, BILIRUBINUR, KETONESUR, PROTEINUR, UROBILINOGEN, NITRITE, LEUKOCYTESUR  STUDIES: Mammography at the [facility] 01/09/2017 found the breast density to be category B. There were no suspicious masses or calcifications.  ASSESSMENT: 56 y.o.  woman status post right breast upper outer quadrant biopsy 11/15/2015 for a clinical T1c N0, stage IA invasive ductal carcinoma, grade 2, estrogen and progesterone receptor positive, HER-2 amplified, with an MIB-1 of 70%  (1) Genetics testing 12/22/2015 through the Hereditary Gene Panel offered by [facility] found no deleterious  mutations in  APC, ATM, AXIN2, BARD1, BMPR1A, BRCA1, BRCA2, BRIP1, CDH1, CDKN2A, CHEK2, DICER1, EPCAM, GREM1, KIT, MEN1, MLH1, MSH2, MSH6, MUTYH, NBN, NF1, PALB2, PDGFRA, PMS2, POLD1, POLE, PTEN, RAD50, RAD51C, RAD51D, SDHA, SDHB, SDHC, SDHD, SMAD4, SMARCA4. STK11, TP53, TSC1, TSC2, and VHL.  (2) right lumpectomy with sentinel lymph node sampling 12/29/2015 showed a pT1c pN0, stage IA invasive ductal carcinoma, grade 3, with negative though close margins (25m to DCIS)  (3) adjuvant chemotherapy with weekly Abraxane 11 given 01/25/2016 through 03/28/2016 with trastuzumab every 3 weeks started 01/25/2016  (a) 12th Abraxane cycle omitted because of peripheral neuropathy  (4) trastuzumab continued to total one year (last dose 12/05/2016)  (a) echo 02/25/2017 showed an ejection fraction of 60-65 %  (5) adjuvant radiation completed 06/27/2016  (6) tamoxifen started 08/09/2016  (a) s/p hysterectomy July 2011  PLAN: KSherleenis aunts. This is favorable.  She is tolerating the tamoxifen generally well. We are going to continue the gabapentin 900 mg at bedtime which is helping somewhat with the nighttime hot flashes.  She is struggling to get her health under control which means weight watchers, exercise, and rehabilitation. I  think by the time I see her a year from now some of the fruits of these endeavors will be evident.  She knows to call for any problems that may develop before her next visit here.   MChauncey Cruel MD   03/06/2017 9:44 AM

## 2017-03-10 ENCOUNTER — Ambulatory Visit: Payer: BLUE CROSS/BLUE SHIELD | Attending: Surgery

## 2017-03-10 DIAGNOSIS — M25512 Pain in left shoulder: Secondary | ICD-10-CM | POA: Insufficient documentation

## 2017-03-10 DIAGNOSIS — M25511 Pain in right shoulder: Secondary | ICD-10-CM | POA: Diagnosis not present

## 2017-03-10 DIAGNOSIS — M25612 Stiffness of left shoulder, not elsewhere classified: Secondary | ICD-10-CM | POA: Diagnosis not present

## 2017-03-10 DIAGNOSIS — G8929 Other chronic pain: Secondary | ICD-10-CM | POA: Diagnosis not present

## 2017-03-10 DIAGNOSIS — M25611 Stiffness of right shoulder, not elsewhere classified: Secondary | ICD-10-CM | POA: Insufficient documentation

## 2017-03-10 NOTE — Therapy (Signed)
[place], [place], [address] Phone: [phone]   Fax:  [phone]  Physical Therapy Treatment  Patient Details  Name: Vanessa [name] MRN: [id number] Date of Birth: December 21, 1960 Referring Provider: Dr. Erroll Luna  Encounter Date: 03/10/2017      PT End of Session - 03/10/17 0849    Visit Number 6   Number of Visits 6   Date for PT Re-Evaluation 04/10/17   PT Start Time 0807  Pt arrived late   PT Stop Time 0847   PT Time Calculation (min) 40 min   Activity Tolerance Patient tolerated treatment well   Behavior During Therapy [facility] for tasks assessed/performed      Past Medical History:  Diagnosis Date  . Anxiety   . Arthritis    knees  . Family history of breast cancer   . Family history of colon cancer   . Family history of thyroid cancer   . History of breast cancer 2017   right  . History of chemotherapy 2017  . History of trigeminal neuralgia   . PONV (postoperative nausea and vomiting)   . Wears partial dentures    upper    Past Surgical History:  Procedure Laterality Date  . BREAST LUMPECTOMY WITH RADIOACTIVE SEED AND SENTINEL LYMPH NODE BIOPSY Right 12/29/2015   Procedure: RIGHT BREAST  RADIOACTIVE SEED LOCALIZATION LUMPECTOMY AND RIGHT SENTINEL LYMPH NODE MAPPING;  Surgeon: Erroll Luna, MD;  Location: [place];  Service: General;  Laterality: Right;  . CYSTOSCOPY  06/28/2010  . KNEE ARTHROSCOPY Left 07/11/2011  . LESION EXCISION Bilateral 01/20/2014   upper and lower eyelids  . PORT-A-CATH REMOVAL Right 02/18/2017   Procedure: REMOVAL PORT-A-CATH;  Surgeon: Erroll Luna, MD;  Location: [facility];  Service: General;  Laterality: Right;  . PORTACATH PLACEMENT N/A 12/29/2015   Procedure: INSERTION PORT-A-CATH;  Surgeon: Erroll Luna, MD;  Location: [facility];  Service: General;  Laterality: N/A;  . TOTAL ABDOMINAL  HYSTERECTOMY W/ BILATERAL SALPINGOOPHORECTOMY  06/28/2010    There were no vitals filed for this visit.      Subjective Assessment - 03/10/17 0809    Subjective My Rt shoulder is doing great, I just need to focus on continuing strengthening at home but it's mostly good to go. My Lt shoulder is starting to feel a little better, the patch is defintitely helping! And Dr. Jana Hakim doesn't need to see me for a year!!!   Pertinent History Right lumpectomy and sentinel node biopsy on 12/30/15 for HER2+ and estrogen receptive breast cancer. The one lymph node was negative. Chemo and radiation after surgery in 2017.    Patient Stated Goals Improve shoulder ROM; lift purse, reach overhead, and wash back with left shoulder without pain   Currently in Pain? Yes   Pain Score 4    Pain Location Shoulder   Pain Orientation Left   Pain Descriptors / Indicators Aching;Dull   Pain Type Surgical pain   Pain Onset More than a month ago   Pain Frequency Constant   Aggravating Factors  pain is still constant but not as strong as it was   Pain Relieving Factors the patch has been helping the most                         [facility] Adult PT Treatment/Exercise - 03/10/17 0001      Iontophoresis   Type of Iontophoresis Dexamethasone  Location Lt slightly posterior to [facility] joint   Dose 40 mA/min   Time 4-6 hr wear     Manual Therapy   Soft tissue mobilization cross friction massage to left shoulder at attachment of rotator cuff   Passive ROM In Supine: To Lt shoulder into flexion, abduction, and er all to pts tolerance, pt had very minimal pain today when she was able to relax                   Short Term Clinic Goals - 03/05/17 3614      CC Short Term Goal  #1   Title Patient will be independent in her initial home exercise program.   Time 4   Period Weeks   Status Achieved     CC Short Term Goal  #2   Title Increase right shoulder flexion to >/= 135 degrees for increased  ease reaching overhead.   Time 4   Period Weeks   Status Achieved     CC Short Term Goal  #3   Title Increase right shoulder abduction to >/= 135 degrees for increased ease reaching overhead.   Time 4   Period Weeks   Status Achieved             Long Term Clinic Goals - 03/05/17 4315      CC Long Term Goal  #1   Title Patient will be independent in her home exercises program including a safe self-progression.   Time 8   Period Weeks   Status On-going     CC Long Term Goal  #2   Title Increase right shoulder flexion to >/= 150 degrees for increased ease reaching overhead.   Time 8   Period Weeks   Status Achieved     CC Long Term Goal  #3   Title Increase right shoulder abduction to >/= 150 degrees for increased ease reaching overhead.   Baseline Achieved 139 degrees today, 03/05/17- 160   Time 8   Period Weeks   Status Achieved     CC Long Term Goal  #4   Title Patient will be able to participate in a long-term exercise program including the [employer] program at the [employer] where she works.   Time 8   Period Weeks   Status On-going     CC Long Term Goal  #5   Title Patient will report >/= 50% reduction in left shoulder pain to allow for reaching overhead and for her purse.   Baseline 03/05/17- 20% improvement   Time 4   Period Weeks   Status On-going     CC Long Term Goal  #6   Title Patient will report she is able to lift her purse and wash her back with >/= 50% less difficulty.   Baseline 03/05/17- 0% less difficulty unchanged   Time 4   Period Weeks   Status On-going     CC Long Term Goal  #7   Title Increase left shoulder active flexion to >/= 120 degrees to reach overhead.   Baseline 03/05/17- 80 degrees with pain   Time 4   Period Weeks   Status On-going            Plan - 03/10/17 0849    Clinical Impression Statement Focused on Lt shoulder today as pt was reporting this was her primary concern today and her Rt UE felt really good. Pt tolerated  stretching well though struggled some with relaxing which would increase  her pain. She reproted feeling good after session today and continued with iontophoresis as pt reports she is noting the benefits from this.     Rehab Potential Excellent   Clinical Impairments Affecting Rehab Potential Surgery was > 1 year ago   PT Frequency 2x / week   PT Duration 8 weeks   PT Treatment/Interventions ADLs/Self Care Home Management;Therapeutic activities;Therapeutic exercise;Manual techniques;Passive range of motion;Scar mobilization;Patient/family education;Iontophoresis 62m/ml Dexamethasone;Electrical Stimulation;Moist Heat;Cryotherapy   PT Next Visit Plan Renewal next visit for cont focus on Lt shoulder goals. Ionto for left shoulder; cont ROM and cross friction massage for left shoulder; ice, heat, e-stim if needed for left shoulder pain; soft tissue/myofascial to painful area in right posterior medial elbow   Consulted and Agree with Plan of Care Patient      Patient will benefit from skilled therapeutic intervention in order to improve the following deficits and impairments:  Decreased range of motion, Increased fascial restricitons, Impaired UE functional use, Pain, Decreased knowledge of precautions, Postural dysfunction, Decreased strength  Visit Diagnosis: Acute pain of left shoulder  Stiffness of left shoulder, not elsewhere classified     Problem List Patient Active Problem List   Diagnosis Date Noted  . Port catheter in place 03/28/2016  . Chemotherapy-induced neuropathy ([place] 03/14/2016  . Obesity, morbid, BMI 40.0-49.9 ([place] 01/15/2016  . Genetic testing 12/26/2015  . Family history of breast cancer   . Family history of colon cancer   . Family history of thyroid cancer   . Malignant neoplasm of upper-outer quadrant of right breast in female, estrogen receptor positive ([place] 11/22/2015  . Trigeminal neuralgia of left side of face 06/22/2014  . Essential hypertension 04/24/2014     ROtelia Limes PTA 03/10/2017, 8:57 AM  [place] [address]Phone: [phone]  Fax:  [phone] Name: Vanessa ORZECHOWSKIMRN: [id number] of Birth: [birth year]

## 2017-03-13 ENCOUNTER — Ambulatory Visit: Payer: BLUE CROSS/BLUE SHIELD

## 2017-03-13 DIAGNOSIS — M25511 Pain in right shoulder: Secondary | ICD-10-CM | POA: Diagnosis not present

## 2017-03-13 DIAGNOSIS — M25512 Pain in left shoulder: Secondary | ICD-10-CM | POA: Diagnosis not present

## 2017-03-13 DIAGNOSIS — M25612 Stiffness of left shoulder, not elsewhere classified: Secondary | ICD-10-CM | POA: Diagnosis not present

## 2017-03-13 DIAGNOSIS — M25611 Stiffness of right shoulder, not elsewhere classified: Secondary | ICD-10-CM

## 2017-03-13 DIAGNOSIS — G8929 Other chronic pain: Secondary | ICD-10-CM

## 2017-03-13 NOTE — Therapy (Signed)
[place], [place], [address] Phone: [phone]   Fax:  [phone]  Physical Therapy Treatment  Patient Details  Name: Vanessa [name] MRN: [id number] Date of Birth: 1961-02-10 Referring Provider: Dr. Erroll Luna  Encounter Date: 03/13/2017      PT End of Session - 03/13/17 0849    Visit Number 7   Number of Visits 22   Date for PT Re-Evaluation 04/10/17   PT Start Time 0809  Pt arrived late   PT Stop Time 0848   PT Time Calculation (min) 39 min   Activity Tolerance Patient tolerated treatment well   Behavior During Therapy [facility] for tasks assessed/performed      Past Medical History:  Diagnosis Date  . Anxiety   . Arthritis    knees  . Family history of breast cancer   . Family history of colon cancer   . Family history of thyroid cancer   . History of breast cancer 2017   right  . History of chemotherapy 2017  . History of trigeminal neuralgia   . PONV (postoperative nausea and vomiting)   . Wears partial dentures    upper    Past Surgical History:  Procedure Laterality Date  . BREAST LUMPECTOMY WITH RADIOACTIVE SEED AND SENTINEL LYMPH NODE BIOPSY Right 12/29/2015   Procedure: RIGHT BREAST  RADIOACTIVE SEED LOCALIZATION LUMPECTOMY AND RIGHT SENTINEL LYMPH NODE MAPPING;  Surgeon: Erroll Luna, MD;  Location: [place];  Service: General;  Laterality: Right;  . CYSTOSCOPY  06/28/2010  . KNEE ARTHROSCOPY Left 07/11/2011  . LESION EXCISION Bilateral 01/20/2014   upper and lower eyelids  . PORT-A-CATH REMOVAL Right 02/18/2017   Procedure: REMOVAL PORT-A-CATH;  Surgeon: Erroll Luna, MD;  Location: [place];  Service: General;  Laterality: Right;  . PORTACATH PLACEMENT N/A 12/29/2015   Procedure: INSERTION PORT-A-CATH;  Surgeon: Erroll Luna, MD;  Location: [place];  Service: General;  Laterality: N/A;  . TOTAL ABDOMINAL  HYSTERECTOMY W/ BILATERAL SALPINGOOPHORECTOMY  06/28/2010    There were no vitals filed for this visit.      Subjective Assessment - 03/13/17 0812    Subjective I've been trying to baby my Lt shoulder some but still use it so it won't get stiff. I took some Ibuprofen last night and that calms it down. My Rt elbow is getting better, I can touch it and it's less tender now!   Pertinent History Right lumpectomy and sentinel node biopsy on 12/30/15 for HER2+ and estrogen receptive breast cancer. The one lymph node was negative. Chemo and radiation after surgery in 2017.    Patient Stated Goals Improve shoulder ROM; lift purse, reach overhead, and wash back with left shoulder without pain   Currently in Pain? Yes   Pain Score 5    Pain Location Shoulder   Pain Orientation Left   Pain Descriptors / Indicators Aching;Dull;Throbbing;Other (Comment)  Sharp with movement   Pain Type Surgical pain   Pain Frequency Intermittent   Aggravating Factors  overuse but it's getting bette   Pain Relieving Factors stretching and the patch            [facility] PT Assessment - 03/13/17 0001      AROM   Left Shoulder Flexion 151 Degrees   Left Shoulder ABduction 81 Degrees  Pain at this point so stopped though pt can go higher  [facility] Adult PT Treatment/Exercise - 03/13/17 0001      Shoulder Exercises: Pulleys   Flexion 3 minutes   ABduction 3 minutes     Iontophoresis   Type of Iontophoresis Dexamethasone   Location Lt slightly posterior to [place] joint   Dose 40 mA/min   Time 4-6 hr wear     Manual Therapy   Soft tissue mobilization cross friction massage to left shoulder at attachment of rotator cuff   Passive ROM In Supine: To Lt shoulder into flexion, abduction, and er all to pts tolerance, pt had very minimal pain today when she was able to relax                   Short Term Clinic Goals - 03/05/17 3875      CC Short Term Goal  #1   Title Patient  will be independent in her initial home exercise program.   Time 4   Period Weeks   Status Achieved     CC Short Term Goal  #2   Title Increase right shoulder flexion to >/= 135 degrees for increased ease reaching overhead.   Time 4   Period Weeks   Status Achieved     CC Short Term Goal  #3   Title Increase right shoulder abduction to >/= 135 degrees for increased ease reaching overhead.   Time 4   Period Weeks   Status Achieved             Long Term Clinic Goals - 03/05/17 6433      CC Long Term Goal  #1   Title Patient will be independent in her home exercises program including a safe self-progression.   Time 8   Period Weeks   Status On-going     CC Long Term Goal  #2   Title Increase right shoulder flexion to >/= 150 degrees for increased ease reaching overhead.   Time 8   Period Weeks   Status Achieved     CC Long Term Goal  #3   Title Increase right shoulder abduction to >/= 150 degrees for increased ease reaching overhead.   Baseline Achieved 139 degrees today, 03/05/17- 160   Time 8   Period Weeks   Status Achieved     CC Long Term Goal  #4   Title Patient will be able to participate in a long-term exercise program including the [employer] program at the [employer] where she works.   Time 8   Period Weeks   Status On-going     CC Long Term Goal  #5   Title Patient will report >/= 50% reduction in left shoulder pain to allow for reaching overhead and for her purse.   Baseline 03/05/17- 20% improvement   Time 4   Period Weeks   Status On-going     CC Long Term Goal  #6   Title Patient will report she is able to lift her purse and wash her back with >/= 50% less difficulty.   Baseline 03/05/17- 0% less difficulty unchanged   Time 4   Period Weeks   Status On-going     CC Long Term Goal  #7   Title Increase left shoulder active flexion to >/= 120 degrees to reach overhead.   Baseline 03/05/17- 80 degrees with pain   Time 4   Period Weeks   Status  On-going            Plan - 03/13/17 2951  Clinical Impression Statement PT making slow but steady progress with her Lt shoulder pain decreasing, and A/ROM increasing. She also reports slowly being able to do more with her Lt UE at work as long as she is slow and careful with her motions. She is tolerating the iontophoresis patch well so continued with this.   Rehab Potential Excellent   Clinical Impairments Affecting Rehab Potential Surgery was > 1 year ago   PT Frequency 2x / week   PT Duration 8 weeks   PT Treatment/Interventions ADLs/Self Care Home Management;Therapeutic activities;Therapeutic exercise;Manual techniques;Passive range of motion;Scar mobilization;Patient/family education;Iontophoresis '4mg'$ /ml Dexamethasone;Electrical Stimulation;Moist Heat;Cryotherapy   PT Next Visit Plan Cont focus on Lt shoulder goals. Ionto for left shoulder; cont ROM and cross friction massage for left shoulder; ice, heat, e-stim if needed for left shoulder pain; soft tissue/myofascial to painful area in right posterior medial elbow   Consulted and Agree with Plan of Care Patient      Patient will benefit from skilled therapeutic intervention in order to improve the following deficits and impairments:  Decreased range of motion, Increased fascial restricitons, Impaired UE functional use, Pain, Decreased knowledge of precautions, Postural dysfunction, Decreased strength  Visit Diagnosis: Acute pain of left shoulder  Stiffness of left shoulder, not elsewhere classified  Chronic right shoulder pain  Stiffness of right shoulder, not elsewhere classified     Problem List Patient Active Problem List   Diagnosis Date Noted  . Port catheter in place 03/28/2016  . Chemotherapy-induced neuropathy ([place]) 03/14/2016  . Obesity, morbid, BMI 40.0-49.9 ([place]) 01/15/2016  . Genetic testing 12/26/2015  . Family history of breast cancer   . Family history of colon cancer   . Family history of thyroid  cancer   . Malignant neoplasm of upper-outer quadrant of right breast in female, estrogen receptor positive ([place]) 11/22/2015  . Trigeminal neuralgia of left side of face 06/22/2014  . Essential hypertension 04/24/2014    Otelia Limes, PTA 03/13/2017, 8:56 AM  [place], [place], [address] Phone: [phone]   Fax:  [phone]  Name: Vanessa [name] MRN: [id number] Date of Birth: 13-Jun-1961

## 2017-03-17 ENCOUNTER — Ambulatory Visit: Payer: BLUE CROSS/BLUE SHIELD

## 2017-03-17 DIAGNOSIS — M25512 Pain in left shoulder: Secondary | ICD-10-CM | POA: Diagnosis not present

## 2017-03-17 DIAGNOSIS — M25611 Stiffness of right shoulder, not elsewhere classified: Secondary | ICD-10-CM | POA: Diagnosis not present

## 2017-03-17 DIAGNOSIS — M25511 Pain in right shoulder: Secondary | ICD-10-CM | POA: Diagnosis not present

## 2017-03-17 DIAGNOSIS — M25612 Stiffness of left shoulder, not elsewhere classified: Secondary | ICD-10-CM

## 2017-03-17 DIAGNOSIS — G8929 Other chronic pain: Secondary | ICD-10-CM | POA: Diagnosis not present

## 2017-03-17 NOTE — Therapy (Signed)
[place], [place], [address] Phone: [phone]   Fax:  [phone]  Physical Therapy Treatment  Patient Details  Name: Vanessa [name] MRN: [id number] Date of Birth: 1961/01/12 Referring Provider: Dr. Erroll Luna  Encounter Date: 03/17/2017      PT End of Session - 03/17/17 0817    Visit Number 8   Number of Visits 22   Date for PT Re-Evaluation 04/10/17   PT Start Time 0811  Pt arrived late   PT Stop Time 0849   PT Time Calculation (min) 38 min   Activity Tolerance Patient tolerated treatment well   Behavior During Therapy [facility] for tasks assessed/performed      Past Medical History:  Diagnosis Date  . Anxiety   . Arthritis    knees  . Family history of breast cancer   . Family history of colon cancer   . Family history of thyroid cancer   . History of breast cancer 2017   right  . History of chemotherapy 2017  . History of trigeminal neuralgia   . PONV (postoperative nausea and vomiting)   . Wears partial dentures    upper    Past Surgical History:  Procedure Laterality Date  . BREAST LUMPECTOMY WITH RADIOACTIVE SEED AND SENTINEL LYMPH NODE BIOPSY Right 12/29/2015   Procedure: RIGHT BREAST  RADIOACTIVE SEED LOCALIZATION LUMPECTOMY AND RIGHT SENTINEL LYMPH NODE MAPPING;  Surgeon: Erroll Luna, MD;  Location: [place];  Service: General;  Laterality: Right;  . CYSTOSCOPY  06/28/2010  . KNEE ARTHROSCOPY Left 07/11/2011  . LESION EXCISION Bilateral 01/20/2014   upper and lower eyelids  . PORT-A-CATH REMOVAL Right 02/18/2017   Procedure: REMOVAL PORT-A-CATH;  Surgeon: Erroll Luna, MD;  Location: [place];  Service: General;  Laterality: Right;  . PORTACATH PLACEMENT N/A 12/29/2015   Procedure: INSERTION PORT-A-CATH;  Surgeon: Erroll Luna, MD;  Location: [place];  Service: General;  Laterality: N/A;  . TOTAL ABDOMINAL  HYSTERECTOMY W/ BILATERAL SALPINGOOPHORECTOMY  06/28/2010    There were no vitals filed for this visit.      Subjective Assessment - 03/17/17 0813    Subjective My Lt shoulder keeps getting better, it's mostly just sore now, not painful!   Pertinent History Right lumpectomy and sentinel node biopsy on 12/30/15 for HER2+ and estrogen receptive breast cancer. The one lymph node was negative. Chemo and radiation after surgery in 2017.    Patient Stated Goals Improve shoulder ROM; lift purse, reach overhead, and wash back with left shoulder without pain   Currently in Pain? No/denies                         [facility] Adult PT Treatment/Exercise - 03/17/17 0001      Shoulder Exercises: Pulleys   Flexion 3 minutes   Flexion Limitations VC to decrease Lt scapular compensations   ABduction 3 minutes   ABduction Limitations VC, but less, to decrease Lt scapular compensations.     Shoulder Exercises: Therapy Ball   ABduction 10 reps  Lt UE with side lean into end of stretch     Iontophoresis   Type of Iontophoresis Dexamethasone   Location Anterior to Lt GH joint   Dose 40 mA/min   Time 4-6 hr wear     Manual Therapy   Soft tissue mobilization cross friction massage to left shoulder at attachment of rotator cuff and more anterior aspect today  especially inferior to clavicle where pt was noticing most soreness.   Passive ROM In Supine: To Lt shoulder into flexion, abduction, and er all to pts tolerance, pt only with soreness today, no pain with end range stretching.                   Short Term Clinic Goals - 03/05/17 4166      CC Short Term Goal  #1   Title Patient will be independent in her initial home exercise program.   Time 4   Period Weeks   Status Achieved     CC Short Term Goal  #2   Title Increase right shoulder flexion to >/= 135 degrees for increased ease reaching overhead.   Time 4   Period Weeks   Status Achieved     CC Short Term Goal  #3    Title Increase right shoulder abduction to >/= 135 degrees for increased ease reaching overhead.   Time 4   Period Weeks   Status Achieved             Long Term Clinic Goals - 03/05/17 0630      CC Long Term Goal  #1   Title Patient will be independent in her home exercises program including a safe self-progression.   Time 8   Period Weeks   Status On-going     CC Long Term Goal  #2   Title Increase right shoulder flexion to >/= 150 degrees for increased ease reaching overhead.   Time 8   Period Weeks   Status Achieved     CC Long Term Goal  #3   Title Increase right shoulder abduction to >/= 150 degrees for increased ease reaching overhead.   Baseline Achieved 139 degrees today, 03/05/17- 160   Time 8   Period Weeks   Status Achieved     CC Long Term Goal  #4   Title Patient will be able to participate in a long-term exercise program including the [employer] program at the [facility] [employer] where she works.   Time 8   Period Weeks   Status On-going     CC Long Term Goal  #5   Title Patient will report >/= 50% reduction in left shoulder pain to allow for reaching overhead and for her purse.   Baseline 03/05/17- 20% improvement   Time 4   Period Weeks   Status On-going     CC Long Term Goal  #6   Title Patient will report she is able to lift her purse and wash her back with >/= 50% less difficulty.   Baseline 03/05/17- 0% less difficulty unchanged   Time 4   Period Weeks   Status On-going     CC Long Term Goal  #7   Title Increase left shoulder active flexion to >/= 120 degrees to reach overhead.   Baseline 03/05/17- 80 degrees with pain   Time 4   Period Weeks   Status On-going            Plan - 03/17/17 0855    Clinical Impression Statement Pt reports noticing good improvement with pain over weekend and today just mostly feels sore in Lt shoulder. Focused on Lt anterior chest strtching inferior to clavicle and P/ROM and pt reported feeling good after session.     Rehab Potential Excellent   Clinical Impairments Affecting Rehab Potential Surgery was > 1 year ago   PT Frequency 2x / week   PT  Duration 8 weeks   PT Treatment/Interventions ADLs/Self Care Home Management;Therapeutic activities;Therapeutic exercise;Manual techniques;Passive range of motion;Scar mobilization;Patient/family education;Iontophoresis '4mg'$ /ml Dexamethasone;Electrical Stimulation;Moist Heat;Cryotherapy   PT Next Visit Plan Cont focus on Lt shoulder goals. Ionto for left shoulder 1 more time; cont ROM and cross friction massage for left shoulder; ice, heat, e-stim if needed for left shoulder pain; soft tissue/myofascial to painful area in right posterior medial elbow   Consulted and Agree with Plan of Care Patient      Patient will benefit from skilled therapeutic intervention in order to improve the following deficits and impairments:  Decreased range of motion, Increased fascial restricitons, Impaired UE functional use, Pain, Decreased knowledge of precautions, Postural dysfunction, Decreased strength  Visit Diagnosis: Acute pain of left shoulder  Stiffness of left shoulder, not elsewhere classified     Problem List Patient Active Problem List   Diagnosis Date Noted  . Port catheter in place 03/28/2016  . Chemotherapy-induced neuropathy ([place]) 03/14/2016  . Obesity, morbid, BMI 40.0-49.9 ([place]) 01/15/2016  . Genetic testing 12/26/2015  . Family history of breast cancer   . Family history of colon cancer   . Family history of thyroid cancer   . Malignant neoplasm of upper-outer quadrant of right breast in female, estrogen receptor positive ([place]) 11/22/2015  . Trigeminal neuralgia of left side of face 06/22/2014  . Essential hypertension 04/24/2014    Otelia Limes, PTA 03/17/2017, 8:58 AM  [place], [place], [address] Phone: [phone]   Fax:  [phone]  Name: IBETH FAHMY MRN: [id number] Date of Birth: [birth year]

## 2017-03-18 DIAGNOSIS — I890 Lymphedema, not elsewhere classified: Secondary | ICD-10-CM | POA: Diagnosis not present

## 2017-03-20 ENCOUNTER — Encounter: Payer: Self-pay | Admitting: Physical Therapy

## 2017-03-20 ENCOUNTER — Ambulatory Visit: Payer: BLUE CROSS/BLUE SHIELD | Admitting: Physical Therapy

## 2017-03-20 DIAGNOSIS — M25611 Stiffness of right shoulder, not elsewhere classified: Secondary | ICD-10-CM | POA: Diagnosis not present

## 2017-03-20 DIAGNOSIS — M25511 Pain in right shoulder: Secondary | ICD-10-CM | POA: Diagnosis not present

## 2017-03-20 DIAGNOSIS — G8929 Other chronic pain: Secondary | ICD-10-CM | POA: Diagnosis not present

## 2017-03-20 DIAGNOSIS — M25512 Pain in left shoulder: Secondary | ICD-10-CM

## 2017-03-20 DIAGNOSIS — M25612 Stiffness of left shoulder, not elsewhere classified: Secondary | ICD-10-CM

## 2017-03-20 NOTE — Patient Instructions (Addendum)
Wall Push    Stand at arm's length from wall, feet shoulder width apart. Inhale while gently leaning toward wall. Hold 1 count. Exhale while pushing back to starting position. Repeat __10__ times per set. Do __1__ sets per session. Do __2__ sessions per day.  Copyright  VHI. All rights reserved.  Scapular Retraction: Rowing (Eccentric) - Arms - 45 Degrees (Resistance Band)    Hold end of band in each hand. Pull back until elbows are even with trunk. Keep elbows out from sides at 45, thumbs up. Slowly release for 3-5 seconds. Use ____yellow____ resistance band. _10__ reps per set, _2__ sets per day, __7_ days per week.   http://ecce.exer.us/229   Copyright  VHI. All rights reserved.  External Rotation (Eccentric), (Resistance Band)    Quickly pull band outward with both arms at the same time (different from picture). Keep elbows at sides, wrists neutral. Slowly return to start for 3-5 seconds. Use ___yellow_____ resistance band.  _10__ reps per set, _1__ sets per day, _7__ days per week.  http://ecce.exer.us/197   Copyright  VHI. All rights reserved.    IONTOPHORESIS PATIENT PRECAUTIONS & CONTRAINDICATIONS:  . Redness under one or both electrodes can occur.  This characterized by a uniform redness that usually disappears within 12 hours of treatment. . Small pinhead size blisters may result in response to the drug.  Contact your physician if the problem persists more than 24 hours. . On rare occasions, iontophoresis therapy can result in temporary skin reactions such as rash, inflammation, irritation or burns.  The skin reactions may be the result of individual sensitivity to the ionic solution used, the condition of the skin at the start of treatment, reaction to the materials in the electrodes, allergies or sensitivity to dexamethasone, or a poor connection between the patch and your skin.  Discontinue using iontophoresis if you have any of these reactions and report to your  therapist. . Remove the Patch or electrodes if you have any undue sensation of pain or burning during the treatment and report discomfort to your therapist. . Tell your Therapist if you have had known adverse reactions to the application of electrical current. . If using the Patch, the LED light will turn off when treatment is complete and the patch can be removed.  Approximate treatment time is 1-3 hours.  Remove the patch when light goes off or after 6 hours. . The Patch can be worn during normal activity, however excessive motion where the electrodes have been placed can cause poor contact between the skin and the electrode or uneven electrical current resulting in greater risk of skin irritation. Marland Kitchen Keep out of the reach of children.   . DO NOT use if you have a cardiac pacemaker or any other electrically sensitive implanted device. . DO NOT use if you have a known sensitivity to dexamethasone. . DO NOT use during Magnetic Resonance Imaging (MRI). . DO NOT use over broken or compromised skin (e.g. sunburn, cuts, or acne) due to the increased risk of skin reaction. . DO NOT SHAVE over the area to be treated:  To establish good contact between the Patch and the skin, excessive hair may be clipped. . DO NOT place the Patch or electrodes on or over your eyes, directly over your heart, or brain. . DO NOT reuse the Patch or electrodes as this may cause burns to occur.

## 2017-03-20 NOTE — Therapy (Addendum)
[place], [place], [address] Phone: [phone]   Fax:  [phone]  Physical Therapy Treatment  Patient Details  Name: Vanessa [name] MRN: [id number] Date of Birth: 1961-01-21 Referring Provider: Dr. Erroll Luna  Encounter Date: 03/20/2017      PT End of Session - 03/20/17 0844    Visit Number 9   Number of Visits 22   Date for PT Re-Evaluation 04/10/17   PT Start Time 0805   PT Stop Time 2409   PT Time Calculation (min) 49 min   Activity Tolerance Patient tolerated treatment well   Behavior During Therapy [facility] for tasks assessed/performed      Past Medical History:  Diagnosis Date  . Anxiety   . Arthritis    knees  . Family history of breast cancer   . Family history of colon cancer   . Family history of thyroid cancer   . History of breast cancer 2017   right  . History of chemotherapy 2017  . History of trigeminal neuralgia   . PONV (postoperative nausea and vomiting)   . Wears partial dentures    upper    Past Surgical History:  Procedure Laterality Date  . BREAST LUMPECTOMY WITH RADIOACTIVE SEED AND SENTINEL LYMPH NODE BIOPSY Right 12/29/2015   Procedure: RIGHT BREAST  RADIOACTIVE SEED LOCALIZATION LUMPECTOMY AND RIGHT SENTINEL LYMPH NODE MAPPING;  Surgeon: Erroll Luna, MD;  Location: [place];  Service: General;  Laterality: Right;  . CYSTOSCOPY  06/28/2010  . KNEE ARTHROSCOPY Left 07/11/2011  . LESION EXCISION Bilateral 01/20/2014   upper and lower eyelids  . PORT-A-CATH REMOVAL Right 02/18/2017   Procedure: REMOVAL PORT-A-CATH;  Surgeon: Erroll Luna, MD;  Location: [employer];  Service: General;  Laterality: Right;  . PORTACATH PLACEMENT N/A 12/29/2015   Procedure: INSERTION PORT-A-CATH;  Surgeon: Erroll Luna, MD;  Location: [place];  Service: General;  Laterality: N/A;  . TOTAL ABDOMINAL HYSTERECTOMY W/ BILATERAL  SALPINGOOPHORECTOMY  06/28/2010    There were no vitals filed for this visit.      Subjective Assessment - 03/20/17 0808    Subjective My left shoulder feels better but yesterday was really bad because I overdid it. It just feels weak and sore now.   Pertinent History Right lumpectomy and sentinel node biopsy on 12/30/15 for HER2+ and estrogen receptive breast cancer. The one lymph node was negative. Chemo and radiation after surgery in 2017.    Patient Stated Goals Improve shoulder ROM; lift purse, reach overhead, and wash back with left shoulder without pain   Currently in Pain? Yes   Pain Score 2    Pain Location Shoulder   Pain Orientation Left   Pain Descriptors / Indicators Sore   Pain Type Chronic pain   Pain Onset More than a month ago   Pain Frequency Intermittent   Aggravating Factors  Using left arm at or above shoulder height   Pain Relieving Factors Stretching            [facility] PT Assessment - 03/20/17 0001      AROM   Left Shoulder Extension 51 Degrees   Left Shoulder Flexion 156 Degrees   Left Shoulder ABduction 135 Degrees   Left Shoulder Internal Rotation 70 Degrees   Left Shoulder External Rotation 89 Degrees                     [facility] Adult PT Treatment/Exercise -  03/20/17 0001      Exercises   Exercises Shoulder     Shoulder Exercises: Standing   Theraband Level (Shoulder External Rotation) Level 1 (Yellow)  x10 with elbows at sides   Theraband Level (Shoulder Retraction) Level 1 (Yellow)  2x10 with elbows flexed   Retraction Limitations Standing with back against wall doing scapular retraction 2x10   Other Standing Exercises Wall push ups x10 with PT demo and verbal cues to work in painfree ROM     Shoulder Exercises: Pulleys   Flexion 3 minutes   ABduction 3 minutes   ABduction Limitations 2 minutes with PT tactile cues for proper plane     Shoulder Exercises: Therapy Ball   Flexion 10 reps   ABduction 10 reps   ABduction  Limitations Performed on both sides; achy with left shoulder     Shoulder Exercises: Isometric Strengthening   ABduction 5X5"  BUE     Shoulder Exercises: Stretch   Corner Stretch 3 reps;10 seconds   Corner Stretch Limitations Performed after scapular strength exercises     Modalities   Modalities Cryotherapy;Iontophoresis     Cryotherapy   Number Minutes Cryotherapy 10 Minutes   Cryotherapy Location Shoulder  Left in sitting   Type of Cryotherapy Ice pack     Iontophoresis   Type of Iontophoresis Dexamethasone   Location Anterior to Lt GH joint   Dose 40 mA/min   Time 4-6 hr wear                PT Education - 03/20/17 0946    Education provided Yes   Education Details Chest opener strengthening; ionto instructions   Person(s) Educated Patient   Methods Explanation;Demonstration;Handout   Comprehension Returned demonstration;Verbalized understanding           Short Term Clinic Goals - 03/05/17 [phone]      CC Short Term Goal  #1   Title Patient will be independent in her initial home exercise program.   Time 4   Period Weeks   Status Achieved     CC Short Term Goal  #2   Title Increase right shoulder flexion to >/= 135 degrees for increased ease reaching overhead.   Time 4   Period Weeks   Status Achieved     CC Short Term Goal  #3   Title Increase right shoulder abduction to >/= 135 degrees for increased ease reaching overhead.   Time 4   Period Weeks   Status Achieved             Long Term Clinic Goals - 03/05/17 0240      CC Long Term Goal  #1   Title Patient will be independent in her home exercises program including a safe self-progression.   Time 8   Period Weeks   Status On-going     CC Long Term Goal  #2   Title Increase right shoulder flexion to >/= 150 degrees for increased ease reaching overhead.   Time 8   Period Weeks   Status Achieved     CC Long Term Goal  #3   Title Increase right shoulder abduction to >/= 150  degrees for increased ease reaching overhead.   Baseline Achieved 139 degrees today, 03/05/17- 160   Time 8   Period Weeks   Status Achieved     CC Long Term Goal  #4   Title Patient will be able to participate in a long-term exercise program including the [employer] program at  the [employer] where she works.   Time 8   Period Weeks   Status On-going     CC Long Term Goal  #5   Title Patient will report >/= 50% reduction in left shoulder pain to allow for reaching overhead and for her purse.   Baseline 03/05/17- 20% improvement   Time 4   Period Weeks   Status On-going     CC Long Term Goal  #6   Title Patient will report she is able to lift her purse and wash her back with >/= 50% less difficulty.   Baseline 03/05/17- 0% less difficulty unchanged   Time 4   Period Weeks   Status On-going     CC Long Term Goal  #7   Title Increase left shoulder active flexion to >/= 120 degrees to reach overhead.   Baseline 03/05/17- 80 degrees with pain   Time 4   Period Weeks   Status On-going            Plan - 03/20/17 0845    Clinical Impression Statement Patient has regained full ROM but has weakness and will benefit from continued PT for strengthening. Ice helped decrease soreness at end of treatment. Today was last ionto treatment.   Rehab Potential Excellent   Clinical Impairments Affecting Rehab Potential Surgery was > 1 year ago   PT Frequency 2x / week   PT Duration 8 weeks   PT Treatment/Interventions ADLs/Self Care Home Management;Therapeutic activities;Therapeutic exercise;Manual techniques;Passive range of motion;Scar mobilization;Patient/family education;Iontophoresis 12m/ml Dexamethasone;Electrical Stimulation;Moist Heat;Cryotherapy   PT Next Visit Plan Strengthening; add isometrics to HEP; ice ot left shoulder as needed   PT Home Exercise Plan Strengthening   Consulted and Agree with Plan of Care Patient      Patient will benefit from skilled therapeutic intervention in  order to improve the following deficits and impairments:  Decreased range of motion, Increased fascial restricitons, Impaired UE functional use, Pain, Decreased knowledge of precautions, Postural dysfunction, Decreased strength  Visit Diagnosis: Acute pain of left shoulder  Stiffness of left shoulder, not elsewhere classified  Chronic right shoulder pain  Stiffness of right shoulder, not elsewhere classified     Problem List Patient Active Problem List   Diagnosis Date Noted  . Port catheter in place 03/28/2016  . Chemotherapy-induced neuropathy ([place] 03/14/2016  . Obesity, morbid, BMI 40.0-49.9 ([place] 01/15/2016  . Genetic testing 12/26/2015  . Family history of breast cancer   . Family history of colon cancer   . Family history of thyroid cancer   . Malignant neoplasm of upper-outer quadrant of right breast in female, estrogen receptor positive ([place] 11/22/2015  . Trigeminal neuralgia of left side of face 06/22/2014  . Essential hypertension 04/24/2014    MAnnia Friendly PT 03/20/17 9:46 AM  [place] [address]: [phone]  Fax:  [phone] Name: KZANAI MALLARIMRN: [id number] of Birth: [birth year]

## 2017-03-24 ENCOUNTER — Ambulatory Visit: Payer: BLUE CROSS/BLUE SHIELD

## 2017-03-28 ENCOUNTER — Ambulatory Visit: Payer: BLUE CROSS/BLUE SHIELD | Admitting: Physical Therapy

## 2017-03-28 DIAGNOSIS — M25612 Stiffness of left shoulder, not elsewhere classified: Secondary | ICD-10-CM

## 2017-03-28 DIAGNOSIS — M25611 Stiffness of right shoulder, not elsewhere classified: Secondary | ICD-10-CM | POA: Diagnosis not present

## 2017-03-28 DIAGNOSIS — G8929 Other chronic pain: Secondary | ICD-10-CM | POA: Diagnosis not present

## 2017-03-28 DIAGNOSIS — M25512 Pain in left shoulder: Secondary | ICD-10-CM | POA: Diagnosis not present

## 2017-03-28 DIAGNOSIS — M25511 Pain in right shoulder: Secondary | ICD-10-CM | POA: Diagnosis not present

## 2017-03-28 NOTE — Patient Instructions (Signed)
Strengthening: Isometric Flexion  Using wall for resistance, press right fist into ball using light pressure. Hold __5__ seconds. Repeat __5__ times per set. Do __1__ sets per session. Do __1__ sessions per day. Increase GRADUALLY to 15 repetitions with all of these.  SHOULDER: Abduction (Isometric)  Use wall as resistance. Press arm against pillow. Keep elbow straight. Hold __5_ seconds. __1_ reps per set, _1__ sets per day, _1__ days per week  Extension (Isometric)  Place left bent elbow and back of arm against wall. Press elbow against wall. Hold __5__ seconds. Repeat __1__ times. Do ___1_ sessions per day.  Internal Rotation (Isometric)  Place palm of right fist against door frame, with elbow bent. Press fist against door frame. Hold __5__ seconds. Repeat __1__ times. Do __1__ sessions per day.  External Rotation (Isometric)  Place back of left fist against door frame, with elbow bent. Press fist against door frame. Hold __5__ seconds. Repeat __1__ times. Do __1__ sessions per day.  Copyright  VHI. All rights reserved.

## 2017-03-28 NOTE — Therapy (Signed)
[place], [place], [address] Phone: [phone]   Fax:  [phone]  Physical Therapy Treatment  Patient Details  Name: Vanessa [name] MRN: [id number] Date of Birth: July 26, 1961 Referring Provider: Dr. Erroll Luna  Encounter Date: 03/28/2017      PT End of Session - 03/28/17 1205    Visit Number 10   Number of Visits 22   Date for PT Re-Evaluation 04/10/17   PT Start Time 3748   PT Stop Time 1007  2 units for this session is correct, by therapist's choice   PT Time Calculation (min) 33 min   Activity Tolerance Patient tolerated treatment well   Behavior During Therapy [facility] for tasks assessed/performed      Past Medical History:  Diagnosis Date  . Anxiety   . Arthritis    knees  . Family history of breast cancer   . Family history of colon cancer   . Family history of thyroid cancer   . History of breast cancer 2017   right  . History of chemotherapy 2017  . History of trigeminal neuralgia   . PONV (postoperative nausea and vomiting)   . Wears partial dentures    upper    Past Surgical History:  Procedure Laterality Date  . BREAST LUMPECTOMY WITH RADIOACTIVE SEED AND SENTINEL LYMPH NODE BIOPSY Right 12/29/2015   Procedure: RIGHT BREAST  RADIOACTIVE SEED LOCALIZATION LUMPECTOMY AND RIGHT SENTINEL LYMPH NODE MAPPING;  Surgeon: Erroll Luna, MD;  Location: [place];  Service: General;  Laterality: Right;  . CYSTOSCOPY  06/28/2010  . KNEE ARTHROSCOPY Left 07/11/2011  . LESION EXCISION Bilateral 01/20/2014   upper and lower eyelids  . PORT-A-CATH REMOVAL Right 02/18/2017   Procedure: REMOVAL PORT-A-CATH;  Surgeon: Erroll Luna, MD;  Location: [place];  Service: General;  Laterality: Right;  . PORTACATH PLACEMENT N/A 12/29/2015   Procedure: INSERTION PORT-A-CATH;  Surgeon: Erroll Luna, MD;  Location: [place];  Service: General;   Laterality: N/A;  . TOTAL ABDOMINAL HYSTERECTOMY W/ BILATERAL SALPINGOOPHORECTOMY  06/28/2010    There were no vitals filed for this visit.      Subjective Assessment - 03/28/17 0935    Subjective Sore, but a workout sore.  Trying to do exercises twice a day.  Left shoulder still catches.   Currently in Pain? Yes   Pain Score 4    Pain Location Shoulder  deltoid, axilla   Pain Orientation Left   Pain Descriptors / Indicators Sore   Aggravating Factors  colder weather, moving the arm, using it a lot   Pain Relieving Factors hasn't taken anything            [facility] PT Assessment - 03/28/17 0001      AROM   Left Shoulder Flexion 159 Degrees                     [facility] Adult PT Treatment/Exercise - 03/28/17 0001      Self-Care   Self-Care Heat/Ice Application;Other Self-Care Comments   Heat/Ice Application Instructed in using cold pack at home just as in clinic for 10 minutes; suggested using it during her work day if she had discomfort, and that she could use it several times a day.     Shoulder Exercises: Pulleys   Flexion Limitations sore today, so tried this but just stopped     Shoulder Exercises: Isometric Strengthening   Flexion 5X5"  standing  Extension 5X5"   External Rotation 5X5"  standing   Internal Rotation 5X5"   ABduction 5X5"     Cryotherapy   Number Minutes Cryotherapy 10 Minutes   Cryotherapy Location Shoulder  Left in sitting   Type of Cryotherapy Ice pack                PT Education - 03/28/17 1205    Education provided Yes   Education Details isometric shoulder strengthening AND to avoid any exercise right now that aggravates pain   Person(s) Educated Patient   Methods Explanation;Handout;Demonstration;Verbal cues   Comprehension Verbalized understanding;Returned demonstration           Short Term Clinic Goals - 03/05/17 6789      CC Short Term Goal  #1   Title Patient will be independent in her initial home  exercise program.   Time 4   Period Weeks   Status Achieved     CC Short Term Goal  #2   Title Increase right shoulder flexion to >/= 135 degrees for increased ease reaching overhead.   Time 4   Period Weeks   Status Achieved     CC Short Term Goal  #3   Title Increase right shoulder abduction to >/= 135 degrees for increased ease reaching overhead.   Time 4   Period Weeks   Status Achieved             Long Term Clinic Goals - 03/28/17 3810      CC Long Term Goal  #1   Title Patient will be independent in her home exercises program including a safe self-progression.   Status On-going     CC Long Term Goal  #2   Title Increase right shoulder flexion to >/= 150 degrees for increased ease reaching overhead.   Status Achieved     CC Long Term Goal  #3   Title Increase right shoulder abduction to >/= 150 degrees for increased ease reaching overhead.   Status Achieved     CC Long Term Goal  #4   Title Patient will be able to participate in a long-term exercise program including the [employer] program at the [employer] where she works.   Status On-going     CC Long Term Goal  #5   Title Patient will report >/= 50% reduction in left shoulder pain to allow for reaching overhead and for her purse.   Baseline 03/05/17- 20% improvement; was at least 60% better before 03/28/17, though aggravated on this date   Status Achieved     CC Long Term Goal  #6   Title Patient will report she is able to lift her purse and wash her back with >/= 50% less difficulty.   Status Achieved     CC Long Term Goal  #7   Title Increase left shoulder active flexion to >/= 120 degrees to reach overhead.   Baseline 03/05/17- 80 degrees with pain; on 03/28/17, 159 degrees   Status Achieved            Plan - 03/28/17 1206    Clinical Impression Statement Patient had felt she was doing better, but has recently had a flareup of her left shoulder pain.  She may have overdone activity; she also reports  that this is a stressful time at work.  Today we limited her activity and part of her education was on how having some ups and downs with left shoulder pain can be normal, but she was  encouraged not to do activity that aggravates pain. She expressed some frustration or disappointment at having gotten worse, but was encouraged to consider it a temporary setback.   Rehab Potential Excellent   Clinical Impairments Affecting Rehab Potential Surgery was > 1 year ago   PT Frequency 2x / week   PT Duration 8 weeks   PT Treatment/Interventions ADLs/Self Care Home Management;Therapeutic activities;Therapeutic exercise;Manual techniques;Passive range of motion;Scar mobilization;Patient/family education;Iontophoresis 4mg /ml Dexamethasone;Electrical Stimulation;Moist Heat;Cryotherapy   PT Next Visit Plan Strengthening; review isometrics prn; ice ot left shoulder as needed   PT Home Exercise Plan Strengthening, including isometrics, but only painfree exercise   Consulted and Agree with Plan of Care Patient      Patient will benefit from skilled therapeutic intervention in order to improve the following deficits and impairments:  Decreased range of motion, Increased fascial restricitons, Impaired UE functional use, Pain, Decreased knowledge of precautions, Postural dysfunction, Decreased strength  Visit Diagnosis: Acute pain of left shoulder  Stiffness of left shoulder, not elsewhere classified     Problem List Patient Active Problem List   Diagnosis Date Noted  . Port catheter in place 03/28/2016  . Chemotherapy-induced neuropathy ([place]) 03/14/2016  . Obesity, morbid, BMI 40.0-49.9 ([place]) 01/15/2016  . Genetic testing 12/26/2015  . Family history of breast cancer   . Family history of colon cancer   . Family history of thyroid cancer   . Malignant neoplasm of upper-outer quadrant of right breast in female, estrogen receptor positive ([place]) 11/22/2015  . Trigeminal neuralgia of left side of face  06/22/2014  . Essential hypertension 04/24/2014    SALIS[place],DONNA 03/28/2017, 12:12 PM  [place], [place], [address] Phone: [phone]   Fax:  [phone]  Name: ABRYANNA MUSOLINO MRN: [id number] Date of Birth: [birth year]  Serafina Royals, PT 03/28/17 12:12 PM

## 2017-03-31 ENCOUNTER — Ambulatory Visit: Payer: BLUE CROSS/BLUE SHIELD

## 2017-03-31 DIAGNOSIS — M25512 Pain in left shoulder: Secondary | ICD-10-CM

## 2017-03-31 DIAGNOSIS — M25612 Stiffness of left shoulder, not elsewhere classified: Secondary | ICD-10-CM

## 2017-03-31 DIAGNOSIS — M25511 Pain in right shoulder: Secondary | ICD-10-CM | POA: Diagnosis not present

## 2017-03-31 DIAGNOSIS — M25611 Stiffness of right shoulder, not elsewhere classified: Secondary | ICD-10-CM | POA: Diagnosis not present

## 2017-03-31 DIAGNOSIS — G8929 Other chronic pain: Secondary | ICD-10-CM | POA: Diagnosis not present

## 2017-03-31 NOTE — Therapy (Signed)
[place], [place], [address] Phone: [phone]   Fax:  [phone]  Physical Therapy Treatment  Patient Details  Name: Vanessa [name] MRN: [id number] Date of Birth: 1961-09-16 Referring Provider: Dr. Erroll Luna  Encounter Date: 03/31/2017      PT End of Session - 03/31/17 0845    Visit Number 11   Number of Visits 22   Date for PT Re-Evaluation 04/10/17   PT Start Time 0800   PT Stop Time 0350  3 units charged per therapist   PT Time Calculation (min) 55 min   Activity Tolerance Patient tolerated treatment well   Behavior During Therapy [facility] for tasks assessed/performed      Past Medical History:  Diagnosis Date  . Anxiety   . Arthritis    knees  . Family history of breast cancer   . Family history of colon cancer   . Family history of thyroid cancer   . History of breast cancer 2017   right  . History of chemotherapy 2017  . History of trigeminal neuralgia   . PONV (postoperative nausea and vomiting)   . Wears partial dentures    upper    Past Surgical History:  Procedure Laterality Date  . BREAST LUMPECTOMY WITH RADIOACTIVE SEED AND SENTINEL LYMPH NODE BIOPSY Right 12/29/2015   Procedure: RIGHT BREAST  RADIOACTIVE SEED LOCALIZATION LUMPECTOMY AND RIGHT SENTINEL LYMPH NODE MAPPING;  Surgeon: Erroll Luna, MD;  Location: [place];  Service: General;  Laterality: Right;  . CYSTOSCOPY  06/28/2010  . KNEE ARTHROSCOPY Left 07/11/2011  . LESION EXCISION Bilateral 01/20/2014   upper and lower eyelids  . PORT-A-CATH REMOVAL Right 02/18/2017   Procedure: REMOVAL PORT-A-CATH;  Surgeon: Erroll Luna, MD;  Location: [place];  Service: General;  Laterality: Right;  . PORTACATH PLACEMENT N/A 12/29/2015   Procedure: INSERTION PORT-A-CATH;  Surgeon: Erroll Luna, MD;  Location: [place];  Service: General;  Laterality: N/A;  . TOTAL  ABDOMINAL HYSTERECTOMY W/ BILATERAL SALPINGOOPHORECTOMY  06/28/2010    There were no vitals filed for this visit.      Subjective Assessment - 03/31/17 0806    Subjective My Lt shoulder has finally calmed down from Friday. I know I did too uch with it but it was still really frustrating that it flared up that bad. I just rested and iced all weekend. Today it just feels achy and crampy, no pain.     Pertinent History Right lumpectomy and sentinel node biopsy on 12/30/15 for HER2+ and estrogen receptive breast cancer. The one lymph node was negative. Chemo and radiation after surgery in 2017.    Patient Stated Goals Improve shoulder ROM; lift purse, reach overhead, and wash back with left shoulder without pain   Currently in Pain? No/denies                         [facility] Adult PT Treatment/Exercise - 03/31/17 0001      Shoulder Exercises: Pulleys   Flexion 2 minutes   ABduction 2 minutes   ABduction Limitations Pt tolerated these much better today with min c/o soreness.     Shoulder Exercises: Therapy Ball   Flexion 10 reps   ABduction 10 reps  Lt UE with side lean into end of stretch   ABduction Limitations Just reports feeling pulling today but "feels good".     Shoulder Exercises: Isometric Strengthening   Flexion 5X5"  Lt UE all in standing in doorway   Extension 5X5"   External Rotation 5X5"   Internal Rotation 5X5"   ABduction 5X5"     Cryotherapy   Number Minutes Cryotherapy 10 Minutes   Cryotherapy Location Shoulder  Lt in sitting at end of session   Type of Cryotherapy Ice pack     Manual Therapy   Soft tissue mobilization To Lt shoulder area, mostly anterior   Passive ROM In Supine to Lt shoulder into flexion, abduction and er to pts tolerance.                   Short Term Clinic Goals - 03/05/17 1950      CC Short Term Goal  #1   Title Patient will be independent in her initial home exercise program.   Time 4   Period Weeks    Status Achieved     CC Short Term Goal  #2   Title Increase right shoulder flexion to >/= 135 degrees for increased ease reaching overhead.   Time 4   Period Weeks   Status Achieved     CC Short Term Goal  #3   Title Increase right shoulder abduction to >/= 135 degrees for increased ease reaching overhead.   Time 4   Period Weeks   Status Achieved             Long Term Clinic Goals - 03/28/17 9326      CC Long Term Goal  #1   Title Patient will be independent in her home exercises program including a safe self-progression.   Status On-going     CC Long Term Goal  #2   Title Increase right shoulder flexion to >/= 150 degrees for increased ease reaching overhead.   Status Achieved     CC Long Term Goal  #3   Title Increase right shoulder abduction to >/= 150 degrees for increased ease reaching overhead.   Status Achieved     CC Long Term Goal  #4   Title Patient will be able to participate in a long-term exercise program including the [employer] program at the [employer] where she works.   Status On-going     CC Long Term Goal  #5   Title Patient will report >/= 50% reduction in left shoulder pain to allow for reaching overhead and for her purse.   Baseline 03/05/17- 20% improvement; was at least 60% better before 03/28/17, though aggravated on this date   Status Achieved     CC Long Term Goal  #6   Title Patient will report she is able to lift her purse and wash her back with >/= 50% less difficulty.   Status Achieved     CC Long Term Goal  #7   Title Increase left shoulder active flexion to >/= 120 degrees to reach overhead.   Baseline 03/05/17- 80 degrees with pain; on 03/28/17, 159 degrees   Status Achieved            Plan - 03/31/17 0846    Clinical Impression Statement Pt is feeling much better since flare up end of last week. She took it esay with activity over weekend and iced all weekend. She was able to tolerate gentle strtching today without increase pain or  tightness. Ended session with ice to Lt shoulder again to help cont with decrease pain/tightness.   Rehab Potential Excellent   Clinical Impairments Affecting Rehab Potential Surgery was > 1 year ago  PT Frequency 2x / week   PT Duration 8 weeks   PT Treatment/Interventions ADLs/Self Care Home Management;Therapeutic activities;Therapeutic exercise;Manual techniques;Passive range of motion;Scar mobilization;Patient/family education;Iontophoresis '4mg'$ /ml Dexamethasone;Electrical Stimulation;Moist Heat;Cryotherapy   PT Next Visit Plan Strengthening; add [place] if pain free; ice to left shoulder as needed   Consulted and Agree with Plan of Care Patient      Patient will benefit from skilled therapeutic intervention in order to improve the following deficits and impairments:  Decreased range of motion, Increased fascial restricitons, Impaired UE functional use, Pain, Decreased knowledge of precautions, Postural dysfunction, Decreased strength  Visit Diagnosis: Acute pain of left shoulder  Stiffness of left shoulder, not elsewhere classified     Problem List Patient Active Problem List   Diagnosis Date Noted  . Port catheter in place 03/28/2016  . Chemotherapy-induced neuropathy ([place]) 03/14/2016  . Obesity, morbid, BMI 40.0-49.9 ([place]) 01/15/2016  . Genetic testing 12/26/2015  . Family history of breast cancer   . Family history of colon cancer   . Family history of thyroid cancer   . Malignant neoplasm of upper-outer quadrant of right breast in female, estrogen receptor positive ([place]) 11/22/2015  . Trigeminal neuralgia of left side of face 06/22/2014  . Essential hypertension 04/24/2014    Otelia Limes, PTA 03/31/2017, 8:49 AM  [place], [place], [address] Phone: [phone]   Fax:  [phone]  Name: Vanessa [name] MRN: [id number] Date of Birth: 12/25/1960

## 2017-04-03 ENCOUNTER — Ambulatory Visit: Payer: BLUE CROSS/BLUE SHIELD

## 2017-04-03 DIAGNOSIS — M25611 Stiffness of right shoulder, not elsewhere classified: Secondary | ICD-10-CM | POA: Diagnosis not present

## 2017-04-03 DIAGNOSIS — M25612 Stiffness of left shoulder, not elsewhere classified: Secondary | ICD-10-CM

## 2017-04-03 DIAGNOSIS — G8929 Other chronic pain: Secondary | ICD-10-CM

## 2017-04-03 DIAGNOSIS — M25511 Pain in right shoulder: Secondary | ICD-10-CM

## 2017-04-03 DIAGNOSIS — M25512 Pain in left shoulder: Secondary | ICD-10-CM

## 2017-04-03 NOTE — Patient Instructions (Signed)
Strengthening: Resisted Flexion    Cancer Rehab [phone]    Hold tubing with left arm at side. Pull forward and up. Move shoulder through pain-free range of motion. Repeat _5-10___ times per set. Do _1-2___ sessions per day.  Strengthening: Resisted Internal Rotation    Hold tubing in left hand, elbow at side and forearm out. Rotate forearm in across body. Repeat _5-10___ times per set. Do _1-2___ sessions per day.  Strengthening: Resisted Extension    Hold tubing in left hand, arm forward. Pull arm back, elbow straight. Repeat __5-10__ times per set. Do __1-2__ sessions per day.   Strengthening: Resisted External Rotation    Hold tubing in left hand, elbow at side and forearm across body. Rotate forearm out. Repeat _5-10___ times per set. Do __1-2__ sessions per day.    

## 2017-04-03 NOTE — Therapy (Signed)
[place], [place], [address] Phone: [phone]   Fax:  [phone]  Physical Therapy Treatment  Patient Details  Name: Vanessa [name] MRN: [id number] Date of Birth: [birth year] Referring Provider: Dr. Erroll Luna  Encounter Date: 04/03/2017      PT End of Session - 04/03/17 0852    Visit Number 12   Number of Visits 22   Date for PT Re-Evaluation 04/10/17   PT Start Time 0801   PT Stop Time 1950  3 unties charged per therapist   PT Time Calculation (min) 54 min   Activity Tolerance Patient tolerated treatment well   Behavior During Therapy [facility] for tasks assessed/performed      Past Medical History:  Diagnosis Date  . Anxiety   . Arthritis    knees  . Family history of breast cancer   . Family history of colon cancer   . Family history of thyroid cancer   . History of breast cancer 2017   right  . History of chemotherapy 2017  . History of trigeminal neuralgia   . PONV (postoperative nausea and vomiting)   . Wears partial dentures    upper    Past Surgical History:  Procedure Laterality Date  . BREAST LUMPECTOMY WITH RADIOACTIVE SEED AND SENTINEL LYMPH NODE BIOPSY Right 12/29/2015   Procedure: RIGHT BREAST  RADIOACTIVE SEED LOCALIZATION LUMPECTOMY AND RIGHT SENTINEL LYMPH NODE MAPPING;  Surgeon: Erroll Luna, MD;  Location: [place];  Service: General;  Laterality: Right;  . CYSTOSCOPY  06/28/2010  . KNEE ARTHROSCOPY Left 07/11/2011  . LESION EXCISION Bilateral 01/20/2014   upper and lower eyelids  . PORT-A-CATH REMOVAL Right 02/18/2017   Procedure: REMOVAL PORT-A-CATH;  Surgeon: Erroll Luna, MD;  Location: [place];  Service: General;  Laterality: Right;  . PORTACATH PLACEMENT N/A 12/29/2015   Procedure: INSERTION PORT-A-CATH;  Surgeon: Erroll Luna, MD;  Location: [place];  Service: General;  Laterality: N/A;  . TOTAL  ABDOMINAL HYSTERECTOMY W/ BILATERAL SALPINGOOPHORECTOMY  06/28/2010    There were no vitals filed for this visit.      Subjective Assessment - 04/03/17 0809    Subjective My Lt shoulder has continued to improve. I've been icing alot and I took my Diclofinac (sp?) and that has helped. I have that for my arthiritis. But my Lt knee has been acting up more than usual so I started taking the [place] which is what that was prescribed for but it has helped my shoulder as well. Nothing is hurting right now.   Pertinent History Right lumpectomy and sentinel node biopsy on 12/30/15 for HER2+ and estrogen receptive breast cancer. The one lymph node was negative. Chemo and radiation after surgery in 2017.    Patient Stated Goals Improve shoulder ROM; lift purse, reach overhead, and wash back with left shoulder without pain   Currently in Pain? No/denies            [facility] PT Assessment - 04/03/17 0001      AROM   Right Shoulder Flexion 163 Degrees   Left Shoulder Flexion 160 Degrees   Left Shoulder ABduction 149 Degrees  still with painful arc but this has decreased to 5/10                     [facility] Adult PT Treatment/Exercise - 04/03/17 0001      Shoulder Exercises: Standing   External Rotation Strengthening;Left;10 reps;Theraband  Theraband Level (Shoulder External Rotation) Level 1 (Yellow)   Internal Rotation Strengthening;Left;10 reps;Theraband   Theraband Level (Shoulder Internal Rotation) Level 1 (Yellow)   Flexion Strengthening;Left;10 reps;Theraband   Theraband Level (Shoulder Flexion) Level 1 (Yellow)   Extension Strengthening;Left;10 reps;Theraband   Theraband Level (Shoulder Extension) Level 1 (Yellow)     Shoulder Exercises: Pulleys   Flexion 2 minutes   ABduction 2 minutes     Shoulder Exercises: Therapy Ball   Flexion 10 reps  With forward lean into end of stretch   ABduction 5 reps  Lt UE with side lean into end of stretch     Cryotherapy   Number  Minutes Cryotherapy 10 Minutes   Cryotherapy Location Shoulder  Lt shoulder in sitting at end of session   Type of Cryotherapy Ice pack     Manual Therapy   Passive ROM In Supine to Lt shoulder into flexion, abduction and er to pts tolerance.                PT Education - 04/03/17 0847    Education provided Yes   Education Details [place] for Lt shoulder but also instructed how to do with Rt and issued red theraband for Rt. Also instructed in importance of pain free motion during and painfree after.    Person(s) Educated Patient   Methods Explanation;Demonstration;Handout   Comprehension Verbalized understanding;Returned demonstration           Short Term Clinic Goals - 03/05/17 [phone]      CC Short Term Goal  #1   Title Patient will be independent in her initial home exercise program.   Time 4   Period Weeks   Status Achieved     CC Short Term Goal  #2   Title Increase right shoulder flexion to >/= 135 degrees for increased ease reaching overhead.   Time 4   Period Weeks   Status Achieved     CC Short Term Goal  #3   Title Increase right shoulder abduction to >/= 135 degrees for increased ease reaching overhead.   Time 4   Period Weeks   Status Achieved             Long Term Clinic Goals - 03/28/17 1638      CC Long Term Goal  #1   Title Patient will be independent in her home exercises program including a safe self-progression.   Status On-going     CC Long Term Goal  #2   Title Increase right shoulder flexion to >/= 150 degrees for increased ease reaching overhead.   Status Achieved     CC Long Term Goal  #3   Title Increase right shoulder abduction to >/= 150 degrees for increased ease reaching overhead.   Status Achieved     CC Long Term Goal  #4   Title Patient will be able to participate in a long-term exercise program including the [employer] program at the [facility] [employer] where she works.   Status On-going     CC Long Term Goal  #5   Title  Patient will report >/= 50% reduction in left shoulder pain to allow for reaching overhead and for her purse.   Baseline 03/05/17- 20% improvement; was at least 60% better before 03/28/17, though aggravated on this date   Status Achieved     CC Long Term Goal  #6   Title Patient will report she is able to lift her purse and wash her back with >/= 50%  less difficulty.   Status Achieved     CC Long Term Goal  #7   Title Increase left shoulder active flexion to >/= 120 degrees to reach overhead.   Baseline 03/05/17- 80 degrees with pain; on 03/28/17, 159 degrees   Status Achieved            Plan - 04/03/17 0853    Clinical Impression Statement Pt continues with improvement of pain and her A/ROM has improved this week as well. Progressed her strengthening today to include [place] with yellow theraband which she tolerated well. She has one more appointment shceduled for 04/07/17 and then won't be back to therapy until week of May 14 for hopefully just one more visit to assess how she's been doing and plan to D/C then.    Rehab Potential Excellent   Clinical Impairments Affecting Rehab Potential Surgery was > 1 year ago   PT Frequency 2x / week   PT Duration 8 weeks   PT Treatment/Interventions ADLs/Self Care Home Management;Therapeutic activities;Therapeutic exercise;Manual techniques;Passive range of motion;Scar mobilization;Patient/family education;Iontophoresis 73m/ml Dexamethasone;Electrical Stimulation;Moist Heat;Cryotherapy   PT Next Visit Plan Review [place] strengthening, cont AA/P/ROM. Pt has one more appt next week, then plans to come for one final visit May 15 to reassess.    Consulted and Agree with Plan of Care Patient      Patient will benefit from skilled therapeutic intervention in order to improve the following deficits and impairments:  Decreased range of motion, Increased fascial restricitons, Impaired UE functional use, Pain, Decreased knowledge of precautions, Postural  dysfunction, Decreased strength  Visit Diagnosis: Acute pain of left shoulder  Stiffness of left shoulder, not elsewhere classified  Chronic right shoulder pain  Stiffness of right shoulder, not elsewhere classified     Problem List Patient Active Problem List   Diagnosis Date Noted  . Port catheter in place 03/28/2016  . Chem[place] 03/14/2016  . Obesity, morbid, BMI 40.0-49.9 ([place] 01/15/2016  . Genetic testing 12/26/2015  . Family history of breast cancer   . Family history of colon cancer   . Family history of thyroid cancer   . Malignant neoplasm of upper-outer quadrant of right breast in female, estrogen receptor positive ([place] 11/22/2015  . Trigeminal neuralgia of left side of face 06/22/2014  . Essential hypertension 04/24/2014    ROtelia Limes PTA 04/03/2017, 9:07 AM  [place] [address]Phone: [phone]  Fax:  [phone] Name: KNAN MAYAMRN: [id number] of Birth: [birth year]

## 2017-04-04 ENCOUNTER — Telehealth: Payer: Self-pay

## 2017-04-04 DIAGNOSIS — Z6841 Body Mass Index (BMI) 40.0 and over, adult: Secondary | ICD-10-CM | POA: Diagnosis not present

## 2017-04-04 DIAGNOSIS — Z01419 Encounter for gynecological examination (general) (routine) without abnormal findings: Secondary | ICD-10-CM | POA: Diagnosis not present

## 2017-04-04 NOTE — Telephone Encounter (Signed)
Called pt about Rx request from [facility] to change her gabapentin from 1 300mg  capsule and 1 600 mg capsule each evening to taking 3 300 mg capsules each evening. Pt prefers to keep gabapentin as taking 2 capsules each evening vs taking 3 capsules each evening. Will send request back to [facility].   Pt was just leaving Dr Cousin's office. She has been having painful L breast. Dr Garwin Brothers did find a hard painful area on the underside of L breast. She is setting pt up for study at [place]. Her current Dx is R breast cancer.

## 2017-04-07 ENCOUNTER — Other Ambulatory Visit: Payer: Self-pay | Admitting: *Deleted

## 2017-04-07 ENCOUNTER — Ambulatory Visit: Payer: BLUE CROSS/BLUE SHIELD

## 2017-04-07 ENCOUNTER — Telehealth: Payer: Self-pay | Admitting: *Deleted

## 2017-04-07 DIAGNOSIS — M25511 Pain in right shoulder: Secondary | ICD-10-CM | POA: Diagnosis not present

## 2017-04-07 DIAGNOSIS — M25612 Stiffness of left shoulder, not elsewhere classified: Secondary | ICD-10-CM

## 2017-04-07 DIAGNOSIS — M25512 Pain in left shoulder: Secondary | ICD-10-CM

## 2017-04-07 DIAGNOSIS — M25611 Stiffness of right shoulder, not elsewhere classified: Secondary | ICD-10-CM | POA: Diagnosis not present

## 2017-04-07 DIAGNOSIS — G8929 Other chronic pain: Secondary | ICD-10-CM

## 2017-04-07 DIAGNOSIS — N644 Mastodynia: Secondary | ICD-10-CM

## 2017-04-07 DIAGNOSIS — N6320 Unspecified lump in the left breast, unspecified quadrant: Secondary | ICD-10-CM

## 2017-04-07 NOTE — Telephone Encounter (Signed)
Received call from patient stating she has been having left breast pain radiating to the nipple since last Wednesday.  She went to see her PCP on Friday and her PCP found a hardened area in her left breast.  Mammo/us ordered and scheduled for 04/08/17 at 945am.  Patient aware of appointment.

## 2017-04-07 NOTE — Therapy (Addendum)
[place], [place], [address] Phone: [phone]   Fax:  [phone]  Physical Therapy Treatment  Patient Details  Name: Vanessa [name] MRN: [id number] Date of Birth: 1961-08-01 Referring Provider: Dr. Erroll Luna  Encounter Date: 04/07/2017      PT End of Session - 04/07/17 0846    Visit Number 12   Number of Visits 22   Date for PT Re-Evaluation 04/10/17   PT Start Time 0801   PT Stop Time 0842   PT Time Calculation (min) 41 min   Activity Tolerance Patient tolerated treatment well   Behavior During Therapy [facility] for tasks assessed/performed      Past Medical History:  Diagnosis Date  . Anxiety   . Arthritis    knees  . Family history of breast cancer   . Family history of colon cancer   . Family history of thyroid cancer   . History of breast cancer 2017   right  . History of chemotherapy 2017  . History of trigeminal neuralgia   . PONV (postoperative nausea and vomiting)   . Wears partial dentures    upper    Past Surgical History:  Procedure Laterality Date  . BREAST LUMPECTOMY WITH RADIOACTIVE SEED AND SENTINEL LYMPH NODE BIOPSY Right 12/29/2015   Procedure: RIGHT BREAST  RADIOACTIVE SEED LOCALIZATION LUMPECTOMY AND RIGHT SENTINEL LYMPH NODE MAPPING;  Surgeon: Erroll Luna, MD;  Location: [place];  Service: General;  Laterality: Right;  . CYSTOSCOPY  06/28/2010  . KNEE ARTHROSCOPY Left 07/11/2011  . LESION EXCISION Bilateral 01/20/2014   upper and lower eyelids  . PORT-A-CATH REMOVAL Right 02/18/2017   Procedure: REMOVAL PORT-A-CATH;  Surgeon: Erroll Luna, MD;  Location: [place];  Service: General;  Laterality: Right;  . PORTACATH PLACEMENT N/A 12/29/2015   Procedure: INSERTION PORT-A-CATH;  Surgeon: Erroll Luna, MD;  Location: [place];  Service: General;  Laterality: N/A;  . TOTAL ABDOMINAL HYSTERECTOMY W/ BILATERAL  SALPINGOOPHORECTOMY  06/28/2010    There were no vitals filed for this visit.      Subjective Assessment - 04/07/17 0803    Subjective My Lt shoulder realy is doing better. It's not hurting this morning, just feels "cranky".   Pertinent History Right lumpectomy and sentinel node biopsy on 12/30/15 for HER2+ and estrogen receptive breast cancer. The one lymph node was negative. Chemo and radiation after surgery in 2017.    Patient Stated Goals Improve shoulder ROM; lift purse, reach overhead, and wash back with left shoulder without pain   Currently in Pain? No/denies                         [facility] Adult PT Treatment/Exercise - 04/07/17 0001      Shoulder Exercises: Pulleys   Flexion 2 minutes   ABduction 2 minutes  Pt reported Lt shoulder achiness increased towards end    ABduction Limitations VC to remind pt to relax Lt scapular compensation     Cryotherapy   Number Minutes Cryotherapy --   Cryotherapy Location --   Type of Cryotherapy --     Electrical Stimulation   Electrical Stimulation Location       Manual Therapy   Passive ROM In Supine to Lt shoulder into flexion, abduction and er to pts tolerance.                   Short Term Clinic Goals -  03/05/17 0824      CC Short Term Goal  #1   Title Patient will be independent in her initial home exercise program.   Time 4   Period Weeks   Status Achieved     CC Short Term Goal  #2   Title Increase right shoulder flexion to >/= 135 degrees for increased ease reaching overhead.   Time 4   Period Weeks   Status Achieved     CC Short Term Goal  #3   Title Increase right shoulder abduction to >/= 135 degrees for increased ease reaching overhead.   Time 4   Period Weeks   Status Achieved             Long Term Clinic Goals - 03/28/17 4580      CC Long Term Goal  #1   Title Patient will be independent in her home exercises program including a safe self-progression.   Status On-going      CC Long Term Goal  #2   Title Increase right shoulder flexion to >/= 150 degrees for increased ease reaching overhead.   Status Achieved     CC Long Term Goal  #3   Title Increase right shoulder abduction to >/= 150 degrees for increased ease reaching overhead.   Status Achieved     CC Long Term Goal  #4   Title Patient will be able to participate in a long-term exercise program including the [employer] program at the [employer] where she works.   Status On-going     CC Long Term Goal  #5   Title Patient will report >/= 50% reduction in left shoulder pain to allow for reaching overhead and for her purse.   Baseline 03/05/17- 20% improvement; was at least 60% better before 03/28/17, though aggravated on this date   Status Achieved     CC Long Term Goal  #6   Title Patient will report she is able to lift her purse and wash her back with >/= 50% less difficulty.   Status Achieved     CC Long Term Goal  #7   Title Increase left shoulder active flexion to >/= 120 degrees to reach overhead.   Baseline 03/05/17- 80 degrees with pain; on 03/28/17, 159 degrees   Status Achieved            Plan - 04/07/17 0847    Clinical Impression Statement Pt has noticed great improvements over weekend. She was able to work in her closet at home some and was moving dishes up/down from higher shelves over weekend but paced herself and did not let herself "over do". This morning she just demonstrated some achiness in Lt shoulder that didn't allow [place] to perform all AA/ROM exercises today but she tolerated P/ROM well and reported her Lt shoulder feeling great by end of session and didn't even need ice today. Pt is progressing well.    Rehab Potential Excellent   Clinical Impairments Affecting Rehab Potential Surgery was > 1 year ago   PT Frequency 2x / week   PT Duration 8 weeks   PT Treatment/Interventions ADLs/Self Care Home Management;Therapeutic activities;Therapeutic exercise;Manual techniques;Passive  range of motion;Scar mobilization;Patient/family education;Iontophoresis '4mg'$ /ml Dexamethasone;Electrical Stimulation;Moist Heat;Cryotherapy   PT Next Visit Plan Review [place] strengthening, cont AA/P/ROM. Plans to come for one final visit May 15 to reassess.    Consulted and Agree with Plan of Care Patient      Patient will benefit from skilled therapeutic intervention in order to  improve the following deficits and impairments:  Decreased range of motion, Increased fascial restricitons, Impaired UE functional use, Pain, Decreased knowledge of precautions, Postural dysfunction, Decreased strength  Visit Diagnosis: Acute pain of left shoulder  Stiffness of left shoulder, not elsewhere classified  Chronic right shoulder pain  Stiffness of right shoulder, not elsewhere classified     Problem List Patient Active Problem List   Diagnosis Date Noted  . Port catheter in place 03/28/2016  . Chemotherapy-induced neuropathy ([place]) 03/14/2016  . Obesity, morbid, BMI 40.0-49.9 ([place]) 01/15/2016  . Genetic testing 12/26/2015  . Family history of breast cancer   . Family history of colon cancer   . Family history of thyroid cancer   . Malignant neoplasm of upper-outer quadrant of right breast in female, estrogen receptor positive ([place]) 11/22/2015  . Trigeminal neuralgia of left side of face 06/22/2014  . Essential hypertension 04/24/2014    Otelia Limes, PTA 04/07/2017, 8:51 AM  [place], [place], [address] Phone: [phone]   Fax:  [phone]  Name: Vanessa [name] MRN: [id number] Date of Birth: 1961-03-26  PHYSICAL THERAPY DISCHARGE SUMMARY  Visits from Start of Care: 12  Current functional level related to goals / functional outcomes: Goals mostly met as noted above.   Remaining deficits: Unknown: Patient was to have one follow-up visit for discharge, but did not have that visit.    Education / Equipment: Home exercise program. Plan: Patient agrees to discharge.  Patient goals were partially met. Patient is being discharged due to being pleased with the current functional level.  ?????    Serafina Royals, PT 03/05/18 3:10 PM

## 2017-04-08 ENCOUNTER — Ambulatory Visit
Admission: RE | Admit: 2017-04-08 | Discharge: 2017-04-08 | Disposition: A | Discharge status: Home / Self Care | Payer: BLUE CROSS/BLUE SHIELD | Source: Ambulatory Visit | Attending: Oncology | Admitting: Oncology

## 2017-04-08 DIAGNOSIS — N644 Mastodynia: Secondary | ICD-10-CM

## 2017-04-08 DIAGNOSIS — R928 Other abnormal and inconclusive findings on diagnostic imaging of breast: Secondary | ICD-10-CM | POA: Diagnosis not present

## 2017-04-08 DIAGNOSIS — N6320 Unspecified lump in the left breast, unspecified quadrant: Secondary | ICD-10-CM

## 2017-04-08 DIAGNOSIS — N6489 Other specified disorders of breast: Secondary | ICD-10-CM | POA: Diagnosis not present

## 2017-04-08 DIAGNOSIS — Z853 Personal history of malignant neoplasm of breast: Secondary | ICD-10-CM | POA: Diagnosis not present

## 2017-04-08 HISTORY — DX: Malignant neoplasm of unspecified site of unspecified female breast: C50.919

## 2017-04-11 ENCOUNTER — Other Ambulatory Visit: Payer: Self-pay | Admitting: Surgery

## 2017-04-11 DIAGNOSIS — N644 Mastodynia: Secondary | ICD-10-CM

## 2017-04-14 ENCOUNTER — Other Ambulatory Visit: Payer: Self-pay

## 2017-04-20 ENCOUNTER — Ambulatory Visit
Admission: RE | Admit: 2017-04-20 | Discharge: 2017-04-20 | Disposition: A | Discharge status: Home / Self Care | Payer: BLUE CROSS/BLUE SHIELD | Source: Ambulatory Visit | Attending: Surgery | Admitting: Surgery

## 2017-04-20 DIAGNOSIS — N644 Mastodynia: Secondary | ICD-10-CM

## 2017-04-20 MED ORDER — GADOBENATE DIMEGLUMINE 529 MG/ML IV SOLN
20.0000 mL | Freq: Once | INTRAVENOUS | Status: AC | PRN
  Administered 2017-04-20: 20 mL via INTRAVENOUS

## 2017-04-21 ENCOUNTER — Other Ambulatory Visit: Payer: Self-pay | Admitting: Oncology

## 2017-04-23 ENCOUNTER — Other Ambulatory Visit: Payer: Self-pay | Admitting: Oncology

## 2017-04-23 ENCOUNTER — Encounter: Payer: Self-pay | Admitting: Oncology

## 2017-05-27 ENCOUNTER — Encounter: Payer: Self-pay | Admitting: Adult Health

## 2017-05-28 DIAGNOSIS — Z1159 Encounter for screening for other viral diseases: Secondary | ICD-10-CM | POA: Diagnosis not present

## 2017-05-28 DIAGNOSIS — Z114 Encounter for screening for human immunodeficiency virus [HIV]: Secondary | ICD-10-CM | POA: Diagnosis not present

## 2017-05-28 DIAGNOSIS — Z118 Encounter for screening for other infectious and parasitic diseases: Secondary | ICD-10-CM | POA: Diagnosis not present

## 2017-05-28 DIAGNOSIS — N760 Acute vaginitis: Secondary | ICD-10-CM | POA: Diagnosis not present

## 2017-05-28 DIAGNOSIS — Z113 Encounter for screening for infections with a predominantly sexual mode of transmission: Secondary | ICD-10-CM | POA: Diagnosis not present

## 2017-06-03 ENCOUNTER — Ambulatory Visit (HOSPITAL_BASED_OUTPATIENT_CLINIC_OR_DEPARTMENT_OTHER): Payer: BLUE CROSS/BLUE SHIELD | Admitting: Adult Health

## 2017-06-03 VITALS — BP 149/74 | HR 100 | Temp 98.4°F | Resp 18 | Ht 64.0 in | Wt 289.7 lb

## 2017-06-03 DIAGNOSIS — Z79810 Long term (current) use of selective estrogen receptor modulators (SERMs): Secondary | ICD-10-CM

## 2017-06-03 DIAGNOSIS — E2839 Other primary ovarian failure: Secondary | ICD-10-CM

## 2017-06-03 DIAGNOSIS — Z170 Estrogen receptor positive status [ER+]: Secondary | ICD-10-CM | POA: Diagnosis not present

## 2017-06-03 DIAGNOSIS — C50411 Malignant neoplasm of upper-outer quadrant of right female breast: Secondary | ICD-10-CM | POA: Diagnosis not present

## 2017-06-04 ENCOUNTER — Encounter: Payer: Self-pay | Admitting: Adult Health

## 2017-06-04 NOTE — Progress Notes (Signed)
CLINIC:  Survivorship   REASON FOR VISIT:  Routine follow-up post-treatment for a recent history of breast cancer.  BRIEF ONCOLOGIC HISTORY:    Malignant neoplasm of upper-outer quadrant of right breast in female, estrogen receptor positive ([place])   11/15/2015 Initial Biopsy    Right breast core biopsy (10 o'clock): IDC, grade 2, DCIS, ER+(100%), PR+(2%), Ki-67 70%, HER-2 positive (ratio 4.92).      11/22/2015 Initial Diagnosis    Malignant neoplasm of upper-outer quadrant of right breast in female, estrogen receptor positive ([place])     12/13/2015 Genetic Testing    Genetic testing was normal, and did not reveal a deleterious mutation.  Genes tested include: APC, ATM, AXIN2, BARD1, BMPR1A, BRCA1, BRCA2, BRIP1, CDH1, CDKN2A, CHEK2, DICER1, EPCAM, GREM1, KIT, MEN1, MLH1, MSH2, MSH6, MUTYH, NBN, NF1, PALB2, PDGFRA, PMS2, POLD1, POLE, PTEN, RAD50, RAD51C, RAD51D, SDHA, SDHB, SDHC, SDHD, SMAD4, SMARCA4. STK11, TP53, TSC1, TSC2, and VHL.      12/29/2015 Surgery    Right breast lumpectomy ([place]): IDC, 1.2cm, DCIS present, grade 3, margins neg, 1 SLN negative, T1cN0      01/25/2016 - 03/28/2016 Adjuvant Chemotherapy    Abraxane x 11 with Trastuzumab every three weeks.  12th cycle of Abraxane omitted due to peripheral neuroapthy      04/18/2016 - 12/05/2016 Adjuvant Chemotherapy    Maintenance Trastuzumab      05/13/2016 - 06/27/2016 Radiation Therapy    Adjuvant radiation Isidore Moos): 1. The Right breast tangents were treated to 50.4 Gy in 28 fractions at 1.8 Gy per fraction.  2. The Right breast was boosted to 10 Gy in 5 fractions at 2 Gy per fraction.      08/2016 -  Anti-estrogen oral therapy    Tamoxifen daily       INTERVAL HISTORY:  Ms. Doffing presents to the [facility] today for our initial meeting to review her survivorship care plan detailing her treatment course for breast cancer, as well as monitoring long-term side effects of that treatment, education regarding health  maintenance, screening, and overall wellness and health promotion.     Overall, Ms. Gillham reports feeling quite well.  She is taking Tamoxifen daily and tolerating it well.  She sees her PCP regularly and is working on exercising more.     REVIEW OF SYSTEMS:  Review of Systems  Constitutional: Negative for appetite change, chills, fatigue, fever and unexpected weight change.  HENT:   Negative for hearing loss and lump/mass.   Eyes: Negative for eye problems and icterus.  Respiratory: Negative for chest tightness, cough, shortness of breath and wheezing.   Cardiovascular: Negative for chest pain, leg swelling and palpitations.  Gastrointestinal: Negative for abdominal distention, abdominal pain, constipation, diarrhea, nausea and vomiting.  Endocrine: Negative for hot flashes.  Genitourinary: Negative for difficulty urinating.   Musculoskeletal: Negative for arthralgias.  Skin: Negative for itching and rash.  Neurological: Negative for dizziness, extremity weakness and headaches.  Hematological: Negative for adenopathy. Does not bruise/bleed easily.  Psychiatric/Behavioral: Negative for depression. The patient is not nervous/anxious.   Breast: Denies any new nodularity, masses, tenderness, nipple changes, or nipple discharge.      ONCOLOGY TREATMENT TEAM:  1. Surgeon:  Dr. Brantley Stage at [facility] 2. Medical Oncologist: Dr. Jana Hakim  3. Radiation Oncologist: Dr. Isidore Moos    PAST MEDICAL/SURGICAL HISTORY:  Past Medical History:  Diagnosis Date  . Anxiety   . Arthritis    knees  . Breast cancer ([place])    right  . Cancer (  [place])   . Family history of breast cancer   . Family history of colon cancer   . Family history of thyroid cancer   . History of breast cancer 2017   right  . History of chemotherapy 2017  . History of trigeminal neuralgia   . PONV (postoperative nausea and vomiting)   . Wears partial dentures    upper   Past Surgical History:  Procedure  Laterality Date  . BREAST LUMPECTOMY WITH RADIOACTIVE SEED AND SENTINEL LYMPH NODE BIOPSY Right 12/29/2015   Procedure: RIGHT BREAST  RADIOACTIVE SEED LOCALIZATION LUMPECTOMY AND RIGHT SENTINEL LYMPH NODE MAPPING;  Surgeon: Erroll Luna, MD;  Location: [place];  Service: General;  Laterality: Right;  . CYSTOSCOPY  06/28/2010  . KNEE ARTHROSCOPY Left 07/11/2011  . LESION EXCISION Bilateral 01/20/2014   upper and lower eyelids  . PORT-A-CATH REMOVAL Right 02/18/2017   Procedure: REMOVAL PORT-A-CATH;  Surgeon: Erroll Luna, MD;  Location: [place];  Service: General;  Laterality: Right;  . PORTACATH PLACEMENT N/A 12/29/2015   Procedure: INSERTION PORT-A-CATH;  Surgeon: Erroll Luna, MD;  Location: [place];  Service: General;  Laterality: N/A;  . TOTAL ABDOMINAL HYSTERECTOMY W/ BILATERAL SALPINGOOPHORECTOMY  06/28/2010     ALLERGIES:  Allergies  Allergen Reactions  . Amoxicillin-Pot Clavulanate Anaphylaxis  . Penicillins Anaphylaxis       . Adhesive [Tape] Other (See Comments)    TEARS SKIN  . Hydrocodone Itching    CAN TAKE WITH BENADRYL  . Meperidine Hives and Swelling  . Cefaclor Rash  . Codeine Rash  . Darvocet [Propoxyphene N-Acetaminophen] Rash  . Erythromycin Rash  . Flexeril [Cyclobenzaprine Hcl] Rash  . Percocet [Oxycodone-Acetaminophen] Itching and Rash  . Sulfa Antibiotics Rash     CURRENT MEDICATIONS:  Outpatient Encounter Prescriptions as of 06/03/2017  Medication Sig  . acetaminophen (TYLENOL) 325 MG tablet Take 650 mg by mouth every 6 (six) hours as needed for mild pain or moderate pain. Reported on 04/18/2016  . b complex vitamins tablet Take 1 tablet by mouth daily. Reported on 06/14/2016  . cetirizine (ZYRTEC) 10 MG tablet Take 10 mg by mouth daily as needed for allergies. Reported on 06/14/2016  . gabapentin (NEURONTIN) 300 MG capsule Take 1 capsule at bedtime together with a 600 mg capsule to make 900 mg at  bedtime  . gabapentin (NEURONTIN) 600 MG tablet Take 1 tablet (600 mg total) by mouth at bedtime.  . Multiple Vitamins-Minerals (CENTRUM SILVER ADULT 50+ PO) Take 1 capsule by mouth daily. Reported on 03/14/2016  . tamoxifen (NOLVADEX) 20 MG tablet Take 1 tablet (20 mg total) by mouth daily.  . traMADol (ULTRAM) 50 MG tablet Take 1 tablet (50 mg total) by mouth 2 (two) times daily as needed. (Patient not taking: Reported on 06/03/2017)  . [DISCONTINUED] tinidazole (TINDAMAX) 500 MG tablet TAKE 4 TABLETS BY MOUTH DAILY FOR 2 DAYS   No facility-administered encounter medications on file as of 06/03/2017.      ONCOLOGIC FAMILY HISTORY:  Family History  Problem Relation Age of Onset  . Diabetes Mother   . Hypertension Mother   . Asthma Daughter   . Diabetes Sister   . Hypertension Sister   . Colon cancer Sister 16  . Thyroid cancer Sister 67  . Lung cancer Father   . Breast cancer Cousin        maternal first cousin  . Breast cancer Cousin 93       maternal first cousin -  inflammatory       SOCIAL HISTORY:  JACQUELYNNE GUEDES is single and lives alone in [place], [place]. Ms. Reigel is currently working at [employer].  She denies any current or history of tobacco, alcohol, or illicit drug use.     PHYSICAL EXAMINATION:  Vital Signs:   Vitals:   06/03/17 1348  BP: (!) 149/74  Pulse: 100  Resp: 18  Temp: 98.4 F (36.9 C)   Filed Weights   06/03/17 1348  Weight: 289 lb 11.2 oz (131.4 kg)   General: Well-nourished, well-appearing female in no acute distress.  She is unaccompanied today.   HEENT: Head is normocephalic.  Pupils equal and reactive to light. Conjunctivae clear without exudate.  Sclerae anicteric. Oral mucosa is pink, moist.  Oropharynx is pink without lesions or erythema.  Lymph: No cervical, supraclavicular, or infraclavicular lymphadenopathy noted on palpation.  Cardiovascular: Regular rate and rhythm.Marland Kitchen Respiratory: Clear to auscultation bilaterally.  Chest expansion symmetric; breathing non-labored.  GI: Abdomen soft and round; non-tender, non-distended. Bowel sounds normoactive.  GU: Deferred.  Neuro: No focal deficits. Steady gait.  Psych: Mood and affect normal and appropriate for situation.  Extremities: No edema. MSK: No focal spinal tenderness to palpation.  Full range of motion in bilateral upper extremities Skin: Warm and dry.  LABORATORY DATA:  None for this visit.  DIAGNOSTIC IMAGING:  None for this visit.      ASSESSMENT AND PLAN:  Ms.. Padron is a pleasant 56 y.o. female with Stage IA right breast invasive ductal carcinoma, ER+/PR-/HER2+, diagnosed in 11/2015, treated with lumpectomy, adjuvant chemotherapy, adjuvant radiation therapy, and anti-estrogen therapy with Tamoxifen daily beginning in 08/2016.  She presents to the Survivorship Clinic for our initial meeting and routine follow-up post-completion of treatment for breast cancer.    1. Stage IA right breast cancer:  Ms. Jeter is continuing to recover from definitive treatment for breast cancer. She will follow-up with her medical oncologist, Dr. Jana Hakim in 02/2018 with history and physical exam per surveillance protocol.  She will continue her anti-estrogen therapy with Tamoxifen. Thus far, she is tolerating the Tamoxifen well, with minimal side effects. She was instructed to make Dr. Jana Hakim or myself aware if she begins to experience any worsening side effects of the medication and I could see her back in clinic to help manage those side effects, as needed. Today, a comprehensive survivorship care plan and treatment summary was reviewed with the patient today detailing her breast cancer diagnosis, treatment course, potential late/long-term effects of treatment, appropriate follow-up care with recommendations for the future, and patient education resources.  A copy of this summary, along with a letter will be sent to the patient's primary care provider via mail/fax/In  Basket message after today's visit.    2. Bone health:  Given Ms. Streck age/history of breast cancer, she is at risk for bone demineralization. She was given education on specific activities to promote bone health.  3. Cancer screening:  Due to Ms. Countess history and her age, she should receive screening for skin cancers, colon cancer, and gynecologic cancers.  The information and recommendations are listed on the patient's comprehensive care plan/treatment summary and were reviewed in detail with the patient.    4. Health maintenance and wellness promotion: Ms. Tellefsen was encouraged to consume 5-7 servings of fruits and vegetables per day. We reviewed the "Nutrition Rainbow" handout, as well as the handout "Take Control of Your Health and Reduce Your Cancer Risk" from the [place].  She was also encouraged to engage in moderate to vigorous exercise for 30 minutes per day most days of the week. We discussed the [employer] fitness program, which is designed for cancer survivors to help them become more physically fit after cancer treatments.  She was instructed to limit her alcohol consumption and continue to abstain from tobacco use.     5. Support services/counseling: It is not uncommon for this period of the patient's cancer care trajectory to be one of many emotions and stressors.  We discussed an opportunity for her to participate in the next session of [facility] ("Finding Your New Normal") support group series designed for patients after they have completed treatment.   Ms. Wisinski was encouraged to take advantage of our many other support services programs, support groups, and/or counseling in coping with her new life as a cancer survivor after completing anti-cancer treatment.  She was offered support today through active listening and expressive supportive counseling.  She was given information regarding our available services and encouraged to contact me with any questions  or for help enrolling in any of our support group/programs.    Dispo:   -Return to cancer center for follow up with Dr. Jana Hakim in 02/2018 -Mammogram due in 01/2018 -Follow up with surgery 08/2017 -She is welcome to return back to the Survivorship Clinic at any time; no additional follow-up needed at this time.  -Consider referral back to survivorship as a long-term survivor for continued surveillance  A total of (30) minutes of face-to-face time was spent with this patient with greater than 50% of that time in counseling and care-coordination.   Gardenia Phlegm, NP Survivorship Program [facility] [phone]   Note: PRIMARY CARE PROVIDER Aretta Nip, [place] [phone]

## 2017-06-30 ENCOUNTER — Ambulatory Visit
Admission: RE | Admit: 2017-06-30 | Discharge: 2017-06-30 | Disposition: A | Discharge status: Home / Self Care | Payer: BLUE CROSS/BLUE SHIELD | Source: Ambulatory Visit | Attending: Adult Health | Admitting: Adult Health

## 2017-06-30 DIAGNOSIS — E2839 Other primary ovarian failure: Secondary | ICD-10-CM

## 2017-06-30 DIAGNOSIS — Z780 Asymptomatic menopausal state: Secondary | ICD-10-CM | POA: Diagnosis not present

## 2017-06-30 DIAGNOSIS — Z13820 Encounter for screening for osteoporosis: Secondary | ICD-10-CM | POA: Diagnosis not present

## 2017-07-07 DIAGNOSIS — Z9011 Acquired absence of right breast and nipple: Secondary | ICD-10-CM | POA: Diagnosis not present

## 2017-07-11 ENCOUNTER — Telehealth: Payer: Self-pay | Admitting: Emergency Medicine

## 2017-07-11 NOTE — Telephone Encounter (Signed)
Called patient to inform her that her bone density scan was normal per Wilber Bihari NP.

## 2017-07-20 ENCOUNTER — Other Ambulatory Visit: Payer: Self-pay | Admitting: Oncology

## 2017-07-24 DIAGNOSIS — N760 Acute vaginitis: Secondary | ICD-10-CM | POA: Diagnosis not present

## 2017-07-24 DIAGNOSIS — B373 Candidiasis of vulva and vagina: Secondary | ICD-10-CM | POA: Diagnosis not present

## 2017-07-28 DIAGNOSIS — Z853 Personal history of malignant neoplasm of breast: Secondary | ICD-10-CM | POA: Diagnosis not present

## 2017-07-30 DIAGNOSIS — I890 Lymphedema, not elsewhere classified: Secondary | ICD-10-CM | POA: Diagnosis not present

## 2017-07-30 DIAGNOSIS — C50911 Malignant neoplasm of unspecified site of right female breast: Secondary | ICD-10-CM | POA: Diagnosis not present

## 2017-08-15 DIAGNOSIS — Z9011 Acquired absence of right breast and nipple: Secondary | ICD-10-CM | POA: Diagnosis not present

## 2017-08-29 ENCOUNTER — Other Ambulatory Visit: Payer: Self-pay

## 2017-08-29 MED ORDER — TAMOXIFEN CITRATE 20 MG PO TABS: 20.0000 mg | ORAL_TABLET | Freq: Every day | ORAL | 3 refills | Status: DC

## 2017-09-17 DIAGNOSIS — C50911 Malignant neoplasm of unspecified site of right female breast: Secondary | ICD-10-CM | POA: Diagnosis not present

## 2017-09-18 DIAGNOSIS — Z13220 Encounter for screening for lipoid disorders: Secondary | ICD-10-CM | POA: Diagnosis not present

## 2017-09-22 DIAGNOSIS — Z0000 Encounter for general adult medical examination without abnormal findings: Secondary | ICD-10-CM | POA: Diagnosis not present

## 2017-09-23 DIAGNOSIS — I890 Lymphedema, not elsewhere classified: Secondary | ICD-10-CM | POA: Diagnosis not present

## 2017-09-25 DIAGNOSIS — I890 Lymphedema, not elsewhere classified: Secondary | ICD-10-CM | POA: Diagnosis not present

## 2017-09-30 ENCOUNTER — Emergency Department (HOSPITAL_COMMUNITY)
Admission: EM | Admit: 2017-09-30 | Discharge: 2017-09-30 | Disposition: A | Discharge status: Home / Self Care | Payer: BLUE CROSS/BLUE SHIELD | Attending: Emergency Medicine | Admitting: Emergency Medicine

## 2017-09-30 ENCOUNTER — Encounter (HOSPITAL_COMMUNITY): Payer: Self-pay | Admitting: Emergency Medicine

## 2017-09-30 DIAGNOSIS — Y92410 Unspecified street and highway as the place of occurrence of the external cause: Secondary | ICD-10-CM | POA: Diagnosis not present

## 2017-09-30 DIAGNOSIS — Y999 Unspecified external cause status: Secondary | ICD-10-CM | POA: Diagnosis not present

## 2017-09-30 DIAGNOSIS — M549 Dorsalgia, unspecified: Secondary | ICD-10-CM | POA: Diagnosis not present

## 2017-09-30 DIAGNOSIS — Y9389 Activity, other specified: Secondary | ICD-10-CM | POA: Insufficient documentation

## 2017-09-30 DIAGNOSIS — Z79899 Other long term (current) drug therapy: Secondary | ICD-10-CM | POA: Insufficient documentation

## 2017-09-30 DIAGNOSIS — M545 Low back pain: Secondary | ICD-10-CM | POA: Diagnosis not present

## 2017-09-30 DIAGNOSIS — Z853 Personal history of malignant neoplasm of breast: Secondary | ICD-10-CM | POA: Diagnosis not present

## 2017-09-30 DIAGNOSIS — I10 Essential (primary) hypertension: Secondary | ICD-10-CM | POA: Insufficient documentation

## 2017-09-30 DIAGNOSIS — I890 Lymphedema, not elsewhere classified: Secondary | ICD-10-CM | POA: Diagnosis not present

## 2017-09-30 MED ORDER — ORPHENADRINE CITRATE ER 100 MG PO TB12: 100.0000 mg | ORAL_TABLET | Freq: Two times a day (BID) | ORAL | 0 refills | Status: DC | PRN

## 2017-09-30 MED ORDER — IBUPROFEN 400 MG PO TABS
400.0000 mg | ORAL_TABLET | Freq: Once | ORAL | Status: AC
  Administered 2017-09-30: 400 mg via ORAL
  Filled 2017-09-30: qty 1
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
## 2017-09-30 NOTE — Discharge Instructions (Signed)
Use diclofenac daily. Use Norflex twice a day as needed for muscle stiffness or soreness.  Have caution, as this may make you tired. May use heat or ice if this helps control your symptoms. In a few days, you may try the back exercises to help stretch your back muscles. You will likely have continued muscle stiffness and soreness over the next several days.  Follow-up with your primary care doctor in 1 week if your pain is not improving. Return to the emergency room if you develop vision changes, vomiting, loss of bowel or bladder control, new persistent numbness, difficulty breathing, or any new or worsening symptoms.

## 2017-10-01 DIAGNOSIS — M62838 Other muscle spasm: Secondary | ICD-10-CM | POA: Diagnosis not present
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
## 2017-10-03 DIAGNOSIS — I890 Lymphedema, not elsewhere classified: Secondary | ICD-10-CM | POA: Diagnosis not present

## 2017-10-07 DIAGNOSIS — C50911 Malignant neoplasm of unspecified site of right female breast: Secondary | ICD-10-CM | POA: Diagnosis not present

## 2017-10-08 DIAGNOSIS — S335XXA Sprain of ligaments of lumbar spine, initial encounter: Secondary | ICD-10-CM | POA: Diagnosis not present

## 2017-10-08 DIAGNOSIS — M7582 Other shoulder lesions, left shoulder: Secondary | ICD-10-CM | POA: Diagnosis not present

## 2017-10-08 DIAGNOSIS — S93491D Sprain of other ligament of right ankle, subsequent encounter: Secondary | ICD-10-CM | POA: Diagnosis not present

## 2017-10-08 DIAGNOSIS — S139XXA Sprain of joints and ligaments of unspecified parts of neck, initial encounter: Secondary | ICD-10-CM | POA: Diagnosis not present

## 2017-10-13 DIAGNOSIS — M25512 Pain in left shoulder: Secondary | ICD-10-CM | POA: Diagnosis not present

## 2017-10-13 DIAGNOSIS — M542 Cervicalgia: Secondary | ICD-10-CM | POA: Diagnosis not present

## 2017-10-16 DIAGNOSIS — M542 Cervicalgia: Secondary | ICD-10-CM | POA: Diagnosis not present

## 2017-10-16 DIAGNOSIS — M25512 Pain in left shoulder: Secondary | ICD-10-CM | POA: Diagnosis not present

## 2017-10-21 DIAGNOSIS — M25512 Pain in left shoulder: Secondary | ICD-10-CM | POA: Diagnosis not present

## 2017-10-21 DIAGNOSIS — M542 Cervicalgia: Secondary | ICD-10-CM | POA: Diagnosis not present

## 2017-10-23 ENCOUNTER — Other Ambulatory Visit: Payer: Self-pay | Admitting: Sports Medicine

## 2017-10-23 DIAGNOSIS — S335XXA Sprain of ligaments of lumbar spine, initial encounter: Secondary | ICD-10-CM | POA: Diagnosis not present

## 2017-10-23 DIAGNOSIS — M549 Dorsalgia, unspecified: Secondary | ICD-10-CM

## 2017-10-23 DIAGNOSIS — M7582 Other shoulder lesions, left shoulder: Secondary | ICD-10-CM | POA: Diagnosis not present

## 2017-10-23 DIAGNOSIS — M25512 Pain in left shoulder: Secondary | ICD-10-CM

## 2017-10-23 DIAGNOSIS — M79605 Pain in left leg: Secondary | ICD-10-CM

## 2017-10-27 ENCOUNTER — Other Ambulatory Visit: Payer: Self-pay

## 2017-10-28 DIAGNOSIS — M542 Cervicalgia: Secondary | ICD-10-CM | POA: Diagnosis not present

## 2017-10-28 DIAGNOSIS — M25512 Pain in left shoulder: Secondary | ICD-10-CM | POA: Diagnosis not present

## 2017-11-04 DIAGNOSIS — M542 Cervicalgia: Secondary | ICD-10-CM | POA: Diagnosis not present

## 2017-11-04 DIAGNOSIS — M25512 Pain in left shoulder: Secondary | ICD-10-CM | POA: Diagnosis not present

## 2017-11-05 ENCOUNTER — Ambulatory Visit
Admission: RE | Admit: 2017-11-05 | Discharge: 2017-11-05 | Disposition: A | Discharge status: Home / Self Care | Payer: BLUE CROSS/BLUE SHIELD | Source: Ambulatory Visit | Attending: Sports Medicine | Admitting: Sports Medicine

## 2017-11-05 DIAGNOSIS — M79605 Pain in left leg: Secondary | ICD-10-CM

## 2017-11-05 DIAGNOSIS — M549 Dorsalgia, unspecified: Secondary | ICD-10-CM

## 2017-11-05 DIAGNOSIS — M7522 Bicipital tendinitis, left shoulder: Secondary | ICD-10-CM | POA: Diagnosis not present

## 2017-11-05 DIAGNOSIS — M25512 Pain in left shoulder: Secondary | ICD-10-CM

## 2017-11-05 DIAGNOSIS — M5127 Other intervertebral disc displacement, lumbosacral region: Secondary | ICD-10-CM | POA: Diagnosis not present

## 2017-11-07 DIAGNOSIS — M7582 Other shoulder lesions, left shoulder: Secondary | ICD-10-CM | POA: Diagnosis not present

## 2017-11-07 DIAGNOSIS — M5106 Intervertebral disc disorders with myelopathy, lumbar region: Secondary | ICD-10-CM | POA: Diagnosis not present

## 2017-11-25 ENCOUNTER — Other Ambulatory Visit: Payer: Self-pay | Admitting: Oncology

## 2017-11-25 DIAGNOSIS — Z853 Personal history of malignant neoplasm of breast: Secondary | ICD-10-CM

## 2017-12-10 DIAGNOSIS — H6693 Otitis media, unspecified, bilateral: Secondary | ICD-10-CM | POA: Diagnosis not present

## 2017-12-10 DIAGNOSIS — I868 Varicose veins of other specified sites: Secondary | ICD-10-CM | POA: Diagnosis not present

## 2017-12-17 DIAGNOSIS — R509 Fever, unspecified: Secondary | ICD-10-CM | POA: Diagnosis not present

## 2017-12-17 DIAGNOSIS — J069 Acute upper respiratory infection, unspecified: Secondary | ICD-10-CM | POA: Diagnosis not present

## 2018-01-14 ENCOUNTER — Ambulatory Visit
Admission: RE | Admit: 2018-01-14 | Discharge: 2018-01-14 | Disposition: A | Discharge status: Home / Self Care | Payer: BLUE CROSS/BLUE SHIELD | Source: Ambulatory Visit | Attending: Oncology | Admitting: Oncology

## 2018-01-14 DIAGNOSIS — R928 Other abnormal and inconclusive findings on diagnostic imaging of breast: Secondary | ICD-10-CM | POA: Diagnosis not present

## 2018-01-14 DIAGNOSIS — Z853 Personal history of malignant neoplasm of breast: Secondary | ICD-10-CM

## 2018-01-14 HISTORY — DX: Personal history of irradiation: Z92.3

## 2018-01-14 HISTORY — DX: Personal history of antineoplastic chemotherapy: Z92.21

## 2018-01-26 DIAGNOSIS — Z853 Personal history of malignant neoplasm of breast: Secondary | ICD-10-CM | POA: Diagnosis not present

## 2018-02-05 ENCOUNTER — Other Ambulatory Visit: Payer: Self-pay | Admitting: Oncology

## 2018-03-05 ENCOUNTER — Ambulatory Visit: Payer: Self-pay | Admitting: Oncology

## 2018-03-05 ENCOUNTER — Other Ambulatory Visit: Payer: Self-pay
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
## 2018-03-16 ENCOUNTER — Other Ambulatory Visit: Payer: Self-pay | Admitting: *Deleted

## 2018-03-16 DIAGNOSIS — Z170 Estrogen receptor positive status [ER+]: Principal | ICD-10-CM

## 2018-03-16 DIAGNOSIS — C50411 Malignant neoplasm of upper-outer quadrant of right female breast: Secondary | ICD-10-CM

## 2018-03-17 ENCOUNTER — Telehealth: Payer: Self-pay | Admitting: Oncology

## 2018-03-17 ENCOUNTER — Inpatient Hospital Stay: Payer: BLUE CROSS/BLUE SHIELD | Attending: Oncology | Admitting: Oncology

## 2018-03-17 ENCOUNTER — Inpatient Hospital Stay: Payer: BLUE CROSS/BLUE SHIELD

## 2018-03-17 VITALS — BP 132/81 | HR 91 | Temp 98.7°F | Resp 18 | Ht 64.0 in | Wt 253.1 lb

## 2018-03-17 DIAGNOSIS — Z79810 Long term (current) use of selective estrogen receptor modulators (SERMs): Secondary | ICD-10-CM | POA: Diagnosis not present

## 2018-03-17 DIAGNOSIS — Z170 Estrogen receptor positive status [ER+]: Principal | ICD-10-CM

## 2018-03-17 DIAGNOSIS — C50411 Malignant neoplasm of upper-outer quadrant of right female breast: Secondary | ICD-10-CM

## 2018-03-17 LAB — CBC WITH DIFFERENTIAL (CANCER CENTER ONLY)
BASOS ABS: 0 10*3/uL (ref 0.0–0.1)
BASOS PCT: 1 %
EOS ABS: 0.2 10*3/uL (ref 0.0–0.5)
EOS PCT: 4 %
HCT: 41.9 % (ref 34.8–46.6)
Hemoglobin: 13.2 g/dL (ref 11.6–15.9)
Lymphocytes Relative: 47 %
Lymphs Abs: 2 10*3/uL (ref 0.9–3.3)
MCH: 28 pg (ref 25.1–34.0)
MCHC: 31.5 g/dL (ref 31.5–36.0)
MCV: 88.8 fL (ref 79.5–101.0)
MONO ABS: 0.2 10*3/uL (ref 0.1–0.9)
Monocytes Relative: 5 %
Neutro Abs: 1.8 10*3/uL (ref 1.5–6.5)
Neutrophils Relative %: 43 %
PLATELETS: 244 10*3/uL (ref 145–400)
RBC: 4.72 MIL/uL (ref 3.70–5.45)
RDW: 13.1 % (ref 11.2–14.5)
WBC Count: 4.1 10*3/uL (ref 3.9–10.3)

## 2018-03-17 LAB — CMP (CANCER CENTER ONLY)
ALBUMIN: 3.3 g/dL — AB (ref 3.5–5.0)
ALK PHOS: 87 U/L (ref 40–150)
ALT: 18 U/L (ref 0–55)
AST: 20 U/L (ref 5–34)
Anion gap: 8 (ref 3–11)
BILIRUBIN TOTAL: 0.3 mg/dL (ref 0.2–1.2)
BUN: 9 mg/dL (ref 7–26)
CALCIUM: 9.1 mg/dL (ref 8.4–10.4)
CO2: 29 mmol/L (ref 22–29)
Chloride: 107 mmol/L (ref 98–109)
Creatinine: 0.73 mg/dL (ref 0.60–1.10)
GFR, Est AFR Am: >60 mL/min (ref >60)
GFR, Estimated: >60 mL/min (ref >60)
GLUCOSE: 99 mg/dL (ref 70–140)
Potassium: 3.8 mmol/L (ref 3.5–5.1)
Sodium: 144 mmol/L (ref 136–145)
Total Protein: 7.3 g/dL (ref 6.4–8.3)

## 2018-03-17 NOTE — Telephone Encounter (Signed)
Gave patient AVS and calendar of upcoming April 2020 appointments.  °

## 2018-05-01 ENCOUNTER — Other Ambulatory Visit: Payer: Self-pay | Admitting: Oncology

## 2018-05-20 DIAGNOSIS — H47211 Primary optic atrophy, right eye: Secondary | ICD-10-CM | POA: Diagnosis not present

## 2018-05-20 DIAGNOSIS — H2513 Age-related nuclear cataract, bilateral: Secondary | ICD-10-CM | POA: Diagnosis not present

## 2018-05-23 ENCOUNTER — Other Ambulatory Visit: Payer: Self-pay | Admitting: Optometry

## 2018-05-23 DIAGNOSIS — H47211 Primary optic atrophy, right eye: Secondary | ICD-10-CM

## 2018-05-28 ENCOUNTER — Other Ambulatory Visit: Payer: Self-pay | Admitting: Oncology

## 2018-05-30 ENCOUNTER — Ambulatory Visit
Admission: RE | Admit: 2018-05-30 | Discharge: 2018-05-30 | Disposition: A | Discharge status: Home / Self Care | Payer: BLUE CROSS/BLUE SHIELD | Source: Ambulatory Visit | Attending: Optometry | Admitting: Optometry

## 2018-05-30 DIAGNOSIS — H47211 Primary optic atrophy, right eye: Secondary | ICD-10-CM

## 2018-05-30 DIAGNOSIS — H47291 Other optic atrophy, right eye: Secondary | ICD-10-CM | POA: Diagnosis not present

## 2018-05-30 MED ORDER — GADOBENATE DIMEGLUMINE 529 MG/ML IV SOLN
20.0000 mL | Freq: Once | INTRAVENOUS | Status: AC | PRN
  Administered 2018-05-30: 20 mL via INTRAVENOUS

## 2018-06-04 DIAGNOSIS — J329 Chronic sinusitis, unspecified: Secondary | ICD-10-CM | POA: Diagnosis not present

## 2018-06-04 DIAGNOSIS — R51 Headache: Secondary | ICD-10-CM | POA: Diagnosis not present

## 2018-06-10 DIAGNOSIS — H47211 Primary optic atrophy, right eye: Secondary | ICD-10-CM | POA: Diagnosis not present

## 2018-06-10 DIAGNOSIS — S0501XA Injury of conjunctiva and corneal abrasion without foreign body, right eye, initial encounter: Secondary | ICD-10-CM | POA: Diagnosis not present

## 2018-06-10 DIAGNOSIS — H2513 Age-related nuclear cataract, bilateral: Secondary | ICD-10-CM | POA: Diagnosis not present

## 2018-07-31 ENCOUNTER — Other Ambulatory Visit: Payer: Self-pay | Admitting: Oncology

## 2018-09-03 DIAGNOSIS — J029 Acute pharyngitis, unspecified: Secondary | ICD-10-CM | POA: Diagnosis not present

## 2018-09-03 DIAGNOSIS — J302 Other seasonal allergic rhinitis: Secondary | ICD-10-CM | POA: Diagnosis not present

## 2018-09-03 DIAGNOSIS — J069 Acute upper respiratory infection, unspecified: Secondary | ICD-10-CM | POA: Diagnosis not present

## 2018-09-18 DIAGNOSIS — C50911 Malignant neoplasm of unspecified site of right female breast: Secondary | ICD-10-CM | POA: Diagnosis not present

## 2018-09-22 ENCOUNTER — Other Ambulatory Visit: Payer: Self-pay | Admitting: Oncology

## 2018-09-22 DIAGNOSIS — I890 Lymphedema, not elsewhere classified: Secondary | ICD-10-CM | POA: Diagnosis not present

## 2018-09-23 DIAGNOSIS — Z1159 Encounter for screening for other viral diseases: Secondary | ICD-10-CM | POA: Diagnosis not present

## 2018-09-23 DIAGNOSIS — Z131 Encounter for screening for diabetes mellitus: Secondary | ICD-10-CM | POA: Diagnosis not present

## 2018-09-23 DIAGNOSIS — Z853 Personal history of malignant neoplasm of breast: Secondary | ICD-10-CM | POA: Diagnosis not present

## 2018-09-23 DIAGNOSIS — Z0001 Encounter for general adult medical examination with abnormal findings: Secondary | ICD-10-CM | POA: Diagnosis not present

## 2018-09-24 DIAGNOSIS — N898 Other specified noninflammatory disorders of vagina: Secondary | ICD-10-CM | POA: Diagnosis not present

## 2018-09-24 DIAGNOSIS — Z136 Encounter for screening for cardiovascular disorders: Secondary | ICD-10-CM | POA: Diagnosis not present

## 2018-09-24 DIAGNOSIS — Z1159 Encounter for screening for other viral diseases: Secondary | ICD-10-CM | POA: Diagnosis not present

## 2018-09-24 DIAGNOSIS — Z131 Encounter for screening for diabetes mellitus: Secondary | ICD-10-CM | POA: Diagnosis not present

## 2018-09-24 DIAGNOSIS — Z13220 Encounter for screening for lipoid disorders: Secondary | ICD-10-CM | POA: Diagnosis not present

## 2018-09-25 DIAGNOSIS — C50911 Malignant neoplasm of unspecified site of right female breast: Secondary | ICD-10-CM | POA: Diagnosis not present

## 2018-09-25 DIAGNOSIS — I890 Lymphedema, not elsewhere classified: Secondary | ICD-10-CM | POA: Diagnosis not present

## 2018-09-28 DIAGNOSIS — H47211 Primary optic atrophy, right eye: Secondary | ICD-10-CM | POA: Diagnosis not present

## 2018-09-28 DIAGNOSIS — S0501XD Injury of conjunctiva and corneal abrasion without foreign body, right eye, subsequent encounter: Secondary | ICD-10-CM | POA: Diagnosis not present

## 2018-10-29 ENCOUNTER — Other Ambulatory Visit: Payer: Self-pay | Admitting: Oncology

## 2019-01-07 ENCOUNTER — Other Ambulatory Visit: Payer: Self-pay | Admitting: Obstetrics and Gynecology

## 2019-01-26 ENCOUNTER — Other Ambulatory Visit: Payer: Self-pay | Admitting: Oncology

## 2019-01-26 DIAGNOSIS — Z853 Personal history of malignant neoplasm of breast: Secondary | ICD-10-CM

## 2019-01-28 ENCOUNTER — Other Ambulatory Visit: Payer: Self-pay | Admitting: Oncology

## 2019-02-09 ENCOUNTER — Ambulatory Visit
Admission: RE | Admit: 2019-02-09 | Discharge: 2019-02-09 | Disposition: A | Discharge status: Home / Self Care | Payer: 59 | Source: Ambulatory Visit | Attending: Oncology | Admitting: Oncology

## 2019-02-09 DIAGNOSIS — Z853 Personal history of malignant neoplasm of breast: Secondary | ICD-10-CM

## 2019-02-26 ENCOUNTER — Telehealth: Payer: Self-pay

## 2019-02-26 NOTE — Telephone Encounter (Signed)
Patient called to get advise regarding the COVID-19.  Patient's son is coming in from [place] this evening.    Nurse reviewed CDC guidelines with patient.  Encouraged patient to have son self quarantine X 2 weeks.  Educated on s/s to be monitoring and notify if symptoms develop.  Patient voiced understanding.  Will call if any additional questions or concerns come up.

## 2019-03-09 ENCOUNTER — Telehealth: Payer: Self-pay | Admitting: Oncology

## 2019-03-09 NOTE — Telephone Encounter (Signed)
Called patient and left message - r/s appt per 3/30 sch message.

## 2019-03-16 ENCOUNTER — Other Ambulatory Visit: Payer: Self-pay

## 2019-03-16 ENCOUNTER — Ambulatory Visit: Payer: Self-pay | Admitting: Oncology

## 2019-04-26 ENCOUNTER — Other Ambulatory Visit: Payer: Self-pay | Admitting: Oncology

## 2019-07-07 ENCOUNTER — Telehealth: Payer: Self-pay | Admitting: Oncology

## 2019-07-07 NOTE — Telephone Encounter (Signed)
R/s appt per 7/28 sch message -pt aware of appt date and time

## 2019-07-18 ENCOUNTER — Other Ambulatory Visit: Payer: Self-pay | Admitting: Oncology

## 2019-07-20 ENCOUNTER — Other Ambulatory Visit: Payer: Self-pay

## 2019-07-20 ENCOUNTER — Ambulatory Visit: Payer: Self-pay | Admitting: Oncology

## 2019-08-18 ENCOUNTER — Other Ambulatory Visit: Payer: Self-pay | Admitting: Oncology

## 2019-09-07 ENCOUNTER — Inpatient Hospital Stay: Payer: 59 | Attending: Oncology

## 2019-09-07 ENCOUNTER — Other Ambulatory Visit: Payer: Self-pay

## 2019-09-07 ENCOUNTER — Inpatient Hospital Stay (HOSPITAL_BASED_OUTPATIENT_CLINIC_OR_DEPARTMENT_OTHER): Payer: 59 | Admitting: Oncology

## 2019-09-07 VITALS — BP 134/72 | HR 101 | Temp 98.5°F | Resp 18 | Ht 65.0 in | Wt 279.5 lb

## 2019-09-07 DIAGNOSIS — Z79810 Long term (current) use of selective estrogen receptor modulators (SERMs): Secondary | ICD-10-CM | POA: Diagnosis not present

## 2019-09-07 DIAGNOSIS — C50411 Malignant neoplasm of upper-outer quadrant of right female breast: Secondary | ICD-10-CM

## 2019-09-07 DIAGNOSIS — Z803 Family history of malignant neoplasm of breast: Secondary | ICD-10-CM | POA: Diagnosis not present

## 2019-09-07 DIAGNOSIS — Z79899 Other long term (current) drug therapy: Secondary | ICD-10-CM | POA: Insufficient documentation

## 2019-09-07 DIAGNOSIS — Z170 Estrogen receptor positive status [ER+]: Secondary | ICD-10-CM | POA: Diagnosis not present

## 2019-09-07 LAB — CBC WITH DIFFERENTIAL/PLATELET
Abs Immature Granulocytes: 0 10*3/uL (ref 0.00–0.07)
Basophils Absolute: 0 10*3/uL (ref 0.0–0.1)
Basophils Relative: 1 %
Eosinophils Absolute: 0.1 10*3/uL (ref 0.0–0.5)
Eosinophils Relative: 2 %
HCT: 40.2 % (ref 36.0–46.0)
Hemoglobin: 12.7 g/dL (ref 12.0–15.0)
Immature Granulocytes: 0 %
Lymphocytes Relative: 54 %
Lymphs Abs: 2.6 10*3/uL (ref 0.7–4.0)
MCH: 27.9 pg (ref 26.0–34.0)
MCHC: 31.6 g/dL (ref 30.0–36.0)
MCV: 88.2 fL (ref 80.0–100.0)
Monocytes Absolute: 0.3 10*3/uL (ref 0.1–1.0)
Monocytes Relative: 6 %
Neutro Abs: 1.8 10*3/uL (ref 1.7–7.7)
Neutrophils Relative %: 37 %
Platelets: 244 10*3/uL (ref 150–400)
RBC: 4.56 MIL/uL (ref 3.87–5.11)
RDW: 13.2 % (ref 11.5–15.5)
WBC: 4.8 10*3/uL (ref 4.0–10.5)
nRBC: 0 % (ref 0.0–0.2)

## 2019-09-07 LAB — COMPREHENSIVE METABOLIC PANEL
ALT: 18 U/L (ref 0–44)
AST: 21 U/L (ref 15–41)
Albumin: 3.5 g/dL (ref 3.5–5.0)
Alkaline Phosphatase: 74 U/L (ref 38–126)
Anion gap: 8 (ref 5–15)
BUN: 17 mg/dL (ref 6–20)
CO2: 28 mmol/L (ref 22–32)
Calcium: 8.8 mg/dL — ABNORMAL LOW (ref 8.9–10.3)
Chloride: 107 mmol/L (ref 98–111)
Creatinine, Ser: 0.79 mg/dL (ref 0.44–1.00)
GFR calc Af Amer: >60 mL/min (ref >60)
GFR calc non Af Amer: >60 mL/min (ref >60)
Glucose, Bld: 143 mg/dL — ABNORMAL HIGH (ref 70–99)
Potassium: 3.4 mmol/L — ABNORMAL LOW (ref 3.5–5.1)
Sodium: 143 mmol/L (ref 135–145)
Total Bilirubin: 0.3 mg/dL (ref 0.3–1.2)
Total Protein: 7 g/dL (ref 6.5–8.1)

## 2019-09-07 NOTE — Progress Notes (Signed)
[place]  [phone] [phone]     ID: LARAYNE BAXLEY DOB: 1961/11/01  MR#: [id number]  EKC#:[id number]  Patient Care Team: Aretta Nip, MD as PCP - General (Family Medicine) Erroll Luna, MD as Consulting Physician (General Surgery) Josphine Laffey, Virgie Dad, MD as Consulting Physician (Oncology) Marcial Pacas, MD as Consulting Physician (Neurology) Gean Birchwood, DPM as Consulting Physician (Podiatry) Servando Salina, MD as Consulting Physician (Obstetrics and Gynecology) Eppie Gibson, MD as Attending Physician (Radiation Oncology) Larey Dresser, MD as Consulting Physician (Cardiology) Delice Bison, Charlestine Massed, NP as Nurse Practitioner (Hematology and Oncology) PCP: Aretta Nip, MD OTHER MD:  CHIEF COMPLAINT: Triple positive breast cancer  CURRENT TREATMENT: tamoxifen   BREAST CANCER HISTORY: From the original intake note:  Leann had screening mammography at Dr. cousins office on 11/06/2015 suggesting a change in the right breast. She was scheduled for right diagnostic mammography and tomosynthesis with ultrasonography at the [facility] 11/13/2015. The breast density was category B. In the upper outer quadrant of the right breast there was a small irregular mass with suspicious internal calcifications. By exam this was small, mobile, firm, and located at the 10:00 position 7 cm from the nipple. Ultrasound of the right breast confirmed an irregularly marginated hypoechoic mass measuring 1.1 cm. The right axilla was sonographically benign.  Biopsy of the right breast mass in question 11/15/2015 showed ([id number]) an invasive ductal carcinoma, grade 2, estrogen receptor 100% positive, progesterone receptor 2% positive, both with strong staining intensity, with an MIB-1 of 70%, and HER-2 amplification, the signals ratio being 4.92 and the number per cell 17.45.  The patient's subsequent history is as detailed below.   INTERVAL HISTORY: Vanessa [name] returns today for follow-up and treatment of her triple positive breast cancer. She was last seen here on 03/18/2019.   She continues on tamoxifen.  She tolerates this generally well.  She does have some night sweats at times.  Since her last visit here, she has not undergone any additional studies. She is up to date on mammography, which was last completed on 02/09/2019 at [facility].    REVIEW OF SYSTEMS: [name] continues to work at [employer] which just reopened.  She never actually stopped working although she worked virtually for a time.  Her son who is 6 feet 63 and works as a Psychologist, educational for a team in [place] is currently home and she got him a job at [employer] with a little kids.  He is teaching them basketball.  She has had no unusual headaches visual changes cough phlegm production pleurisy shortness of breath or change in bowel or bladder habits.  A detailed review of systems today was otherwise stable   PAST MEDICAL HISTORY: Past Medical History:  Diagnosis Date  . Anxiety   . Arthritis    knees  . Breast cancer ([place]) 12/2015   right breast  . Cancer ([place])   . Family history of breast cancer   . Family history of colon cancer   . Family history of thyroid cancer   . History of breast cancer 2017   right  . History of chemotherapy 2017  . History of trigeminal neuralgia   . Personal history of chemotherapy 2017  . Personal history of radiation therapy 2017  . PONV (postoperative nausea and vomiting)   . Wears partial dentures    upper    PAST SURGICAL HISTORY: Past Surgical History:  Procedure Laterality Date  . BREAST LUMPECTOMY  Right 12/2015  . BREAST LUMPECTOMY WITH RADIOACTIVE SEED AND SENTINEL LYMPH NODE BIOPSY Right 12/29/2015   Procedure: RIGHT BREAST  RADIOACTIVE SEED LOCALIZATION LUMPECTOMY AND RIGHT SENTINEL LYMPH NODE MAPPING;  Surgeon: Erroll Luna, MD;  Location: [facility];  Service: General;  Laterality:  Right;  . CYSTOSCOPY  06/28/2010  . KNEE ARTHROSCOPY Left 07/11/2011  . LESION EXCISION Bilateral 01/20/2014   upper and lower eyelids  . PORT-A-CATH REMOVAL Right 02/18/2017   Procedure: REMOVAL PORT-A-CATH;  Surgeon: Erroll Luna, MD;  Location: [facility];  Service: General;  Laterality: Right;  . PORTACATH PLACEMENT N/A 12/29/2015   Procedure: INSERTION PORT-A-CATH;  Surgeon: Erroll Luna, MD;  Location: [facility];  Service: General;  Laterality: N/A;  . TOTAL ABDOMINAL HYSTERECTOMY W/ BILATERAL SALPINGOOPHORECTOMY  06/28/2010    FAMILY HISTORY Family History  Problem Relation Age of Onset  . Diabetes Mother   . Hypertension Mother   . Asthma Daughter   . Diabetes Sister   . Hypertension Sister   . Colon cancer Sister 24  . Thyroid cancer Sister 77  . Lung cancer Father   . Breast cancer Cousin        maternal first cousin  . Breast cancer Cousin 20       maternal first cousin - inflammatory   the patient's father died from lung cancer at the age of 70, in the setting of tobacco abuse. The patient's mother is living, at age 34. The patient had 2 brothers, 3 sisters. One sister was diagnosed with colon cancer in her 33s and later thyroid cancer. She died from metastatic disease. One brother died with pulmonary problems. On the maternal side a cousin was diagnosed with inflammatory breast cancer at the age of 83. A second cousin on the maternal side was diagnosed with breast cancer in her early 73s. There is no history of ovarian cancer in the family.  GYNECOLOGIC HISTORY:  No LMP recorded. Patient is postmenopausal. Menarche age 26, first live birth age 1, the patient is [name]. She underwent simple hysterectomy with bilateral salpingectomy 06/28/2010, with benign pathology([id number]). She still has her ovaries in place. 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  She lives alone.  She describes herself is single.  Her daughter [name] teaches theater at [employer] middle school and is currently studying towards a Masters in counseling; and her son [name], who is a Education officer, environmental in the international circuit (currently in [place] [employer]. The patient has no grandchildren. She attends a Tour manager    ADVANCED DIRECTIVES: Not in place. At the initial clinic visit 09/07/2019 the patient was given the appropriate forms to complete and notarize at her discretion.  HEALTH MAINTENANCE: Social History   Tobacco Use  . Smoking status: Never Smoker  . Smokeless tobacco: Never Used  Substance Use Topics  . Alcohol use: No  . Drug use: No     Colonoscopy: 2016/[facility]  PAP:  Bone density:06/30/2017 showed a T score of -0.2 normal  Lipid panel:  Allergies  Allergen Reactions  . Amoxicillin-Pot Clavulanate Anaphylaxis  . Penicillins Anaphylaxis       . Adhesive [Tape] Other (See Comments)    TEARS SKIN  . Hydrocodone Itching    CAN TAKE WITH BENADRYL  . Meperidine Hives and Swelling  . Cefaclor Rash  . Codeine Rash  . Darvocet [Propoxyphene  N-Acetaminophen] Rash  . Erythromycin Rash  . Flexeril [Cyclobenzaprine Hcl] Rash  . Percocet [Oxycodone-Acetaminophen] Itching and Rash  . Sulfa Antibiotics Rash    Current Outpatient Medications  Medication Sig Dispense Refill  . acetaminophen (TYLENOL) 325 MG tablet Take 650 mg by mouth every 6 (six) hours as needed for mild pain or moderate pain. Reported on 04/18/2016    . b complex vitamins tablet Take 1 tablet by mouth daily. Reported on 06/14/2016    . cetirizine (ZYRTEC) 10 MG tablet Take 10 mg by mouth daily as needed for allergies. Reported on 06/14/2016    . gabapentin (NEURONTIN) 300 MG capsule TAKE 1 CAPSULE AT BEDTIME TOGETHER WITH A 600 MG CAPSULE TO MAKE 900 MG AT BEDTIME(RF ON 4/7) 90 capsule 0  . gabapentin (NEURONTIN) 600 MG  tablet TAKE 1 TABLET (600 MG TOTAL) BY MOUTH AT BEDTIME. 90 tablet 4  . Multiple Vitamins-Minerals (CENTRUM SILVER ADULT 50+ PO) Take 1 capsule by mouth daily. Reported on 03/14/2016    . tamoxifen (NOLVADEX) 20 MG tablet TAKE 1 TABLET BY MOUTH EVERY DAY 90 tablet 3  . traMADol (ULTRAM) 50 MG tablet Take 1 tablet (50 mg total) by mouth 2 (two) times daily as needed. 50 tablet 0   No current facility-administered medications for this visit.     OBJECTIVE: Morbidly obese African-American woman in no acute distress  Vitals:   09/07/19 1545  BP: 134/72  Pulse: (!) 101  Resp: 18  Temp: 98.5 F (36.9 C)  SpO2: 100%   Wt Readings from Last 3 Encounters:  09/07/19 279 lb 8 oz (126.8 kg)  03/17/18 253 lb 1.6 oz (114.8 kg)  09/30/17 268 lb (121.6 kg)   Body mass index is 46.51 kg/m.    ECOG FS:1 - Symptomatic but completely ambulatory  Ocular: Sclerae unicteric, pupils round and equal Ear-nose-throat: Wearing a mask Lymphatic: No cervical or supraclavicular adenopathy Lungs no rales or rhonchi Heart regular rate and rhythm Abd soft, nontender, positive bowel sounds MSK no focal spinal tenderness, no joint edema Neuro: non-focal, well-oriented, appropriate affect Breasts: Status post right lumpectomy and radiation with no evidence of disease recurrence.  Left breast is benign.  Both axillae are benign.   LAB RESULTS:  CMP     Component Value Date/Time   NA 143 09/07/2019 1527   NA 143 03/06/2017 0924   K 3.4 (L) 09/07/2019 1527   K 3.6 03/06/2017 0924   CL 107 09/07/2019 1527   CO2 28 09/07/2019 1527   CO2 28 03/06/2017 0924   GLUCOSE 143 (H) 09/07/2019 1527   GLUCOSE 95 03/06/2017 0924   BUN 17 09/07/2019 1527   BUN 12.6 03/06/2017 0924   CREATININE 0.79 09/07/2019 1527   CREATININE 0.73 03/17/2018 0827   CREATININE 0.7 03/06/2017 0924   CALCIUM 8.8 (L) 09/07/2019 1527   CALCIUM 8.9 03/06/2017 0924   PROT 7.0 09/07/2019 1527   PROT 6.9 03/06/2017 0924   ALBUMIN 3.5  09/07/2019 1527   ALBUMIN 3.3 (L) 03/06/2017 0924   AST 21 09/07/2019 1527   AST 20 03/17/2018 0827   AST 20 03/06/2017 0924   ALT 18 09/07/2019 1527   ALT 18 03/17/2018 0827   ALT 20 03/06/2017 0924   ALKPHOS 74 09/07/2019 1527   ALKPHOS 80 03/06/2017 0924   BILITOT 0.3 09/07/2019 1527   BILITOT 0.3 03/17/2018 0827   BILITOT 0.41 03/06/2017 0924   GFRNONAA >60 09/07/2019 1527   GFRNONAA >60 03/17/2018 0827   GFRAA >60 09/07/2019  [facility] 03/17/2018 0827    INo results found for: SPEP, UPEP  Lab Results  Component Value Date   WBC 4.8 09/07/2019   NEUTROABS 1.8 09/07/2019   HGB 12.7 09/07/2019   HCT 40.2 09/07/2019   MCV 88.2 09/07/2019   PLT 244 09/07/2019      Chemistry      Component Value Date/Time   NA 143 09/07/2019 1527   NA 143 03/06/2017 0924   K 3.4 (L) 09/07/2019 1527   K 3.6 03/06/2017 0924   CL 107 09/07/2019 1527   CO2 28 09/07/2019 1527   CO2 28 03/06/2017 0924   BUN 17 09/07/2019 1527   BUN 12.6 03/06/2017 0924   CREATININE 0.79 09/07/2019 1527   CREATININE 0.73 03/17/2018 0827   CREATININE 0.7 03/06/2017 0924      Component Value Date/Time   CALCIUM 8.8 (L) 09/07/2019 1527   CALCIUM 8.9 03/06/2017 0924   ALKPHOS 74 09/07/2019 1527   ALKPHOS 80 03/06/2017 0924   AST 21 09/07/2019 1527   AST 20 03/17/2018 0827   AST 20 03/06/2017 0924   ALT 18 09/07/2019 1527   ALT 18 03/17/2018 0827   ALT 20 03/06/2017 0924   BILITOT 0.3 09/07/2019 1527   BILITOT 0.3 03/17/2018 0827   BILITOT 0.41 03/06/2017 0924      No results found for: LABCA2  No components found for: LABCA125  No results for input(s): INR in the last 168 hours.  Urinalysis No results found for: COLORURINE, APPEARANCEUR, LABSPEC, PHURINE, GLUCOSEU, HGBUR, BILIRUBINUR, KETONESUR, PROTEINUR, UROBILINOGEN, NITRITE, LEUKOCYTESUR   STUDIES: No results found.   ASSESSMENT: 58 y.o. [place] woman status post right breast upper outer quadrant biopsy 11/15/2015 for a  clinical T1c N0, stage IA invasive ductal carcinoma, grade 2, estrogen and progesterone receptor positive, HER-2 amplified, with an MIB-1 of 70%  (1) Genetics testing 12/22/2015 through the Hereditary Gene Panel offered by [facility] found no deleterious mutations in  APC, ATM, AXIN2, BARD1, BMPR1A, BRCA1, BRCA2, BRIP1, CDH1, CDKN2A, CHEK2, DICER1, EPCAM, GREM1, KIT, MEN1, MLH1, MSH2, MSH6, MUTYH, NBN, NF1, PALB2, PDGFRA, PMS2, POLD1, POLE, PTEN, RAD50, RAD51C, RAD51D, SDHA, SDHB, SDHC, SDHD, SMAD4, SMARCA4. STK11, TP53, TSC1, TSC2, and VHL.  (2) right lumpectomy with sentinel lymph node sampling 12/29/2015 showed a pT1c pN0, stage IA invasive ductal carcinoma, grade 3, with negative though close margins (89m to DCIS)  (3) adjuvant chemotherapy with weekly Abraxane 11 given 01/25/2016 through 03/28/2016 with trastuzumab every 3 weeks started 01/25/2016  (a) 12th Abraxane cycle omitted because of peripheral neuropathy  (4) trastuzumab continued to total one year (last dose 12/05/2016)  (a) echo 02/25/2017 showed an ejection fraction of 60-65 %  (5) adjuvant radiation completed 06/27/2016  (6) tamoxifen started 08/09/2016  (a) s/p hysterectomy July 2011   PLAN: KSundaiis now nearly 4 years out from definitive surgery for her breast cancer with no evidence of disease recurrence.  This is very favorable.  She is tolerating tamoxifen well and the plan will be to continue that through September 2022.  Her next mammogram will be in March.  She will see me again a year from now  She knows to call for any other issue that may develop before the next visit.  Brihany Butch, GVirgie Dad MD  09/07/19 4:12 PM Medical Oncology and Hematology [facility] [place] [id number]. [phone]   Fax. [phone] I, AJacqualyn Poseyam acting as a sEducation administratorfor [employer]  Kylei Purington, MD.   I, Lurline Del MD, have reviewed the above documentation for accuracy and completeness,  and I agree with the above.

## 2019-09-08 ENCOUNTER — Telehealth: Payer: Self-pay | Admitting: Oncology

## 2019-09-08 NOTE — Telephone Encounter (Signed)
I talk with patient regarding schedule  

## 2019-09-16 ENCOUNTER — Other Ambulatory Visit: Payer: Self-pay | Admitting: Oncology

## 2019-10-06 ENCOUNTER — Other Ambulatory Visit: Payer: Self-pay | Admitting: Oncology

## 2019-11-29 ENCOUNTER — Ambulatory Visit: Payer: BC Managed Care – PPO | Attending: Internal Medicine

## 2019-11-29 DIAGNOSIS — Z20828 Contact with and (suspected) exposure to other viral communicable diseases: Secondary | ICD-10-CM | POA: Diagnosis not present

## 2019-11-29 DIAGNOSIS — Z20822 Contact with and (suspected) exposure to covid-19: Secondary | ICD-10-CM

## 2019-11-30 LAB — NOVEL CORONAVIRUS, NAA: SARS-CoV-2, NAA: NOT DETECTED

## 2020-01-12 ENCOUNTER — Other Ambulatory Visit: Payer: Self-pay | Admitting: Oncology

## 2020-01-12 DIAGNOSIS — Z9889 Other specified postprocedural states: Secondary | ICD-10-CM

## 2020-02-10 ENCOUNTER — Other Ambulatory Visit: Payer: Self-pay

## 2020-02-10 ENCOUNTER — Ambulatory Visit
Admission: RE | Admit: 2020-02-10 | Discharge: 2020-02-10 | Disposition: A | Discharge status: Home / Self Care | Payer: BC Managed Care – PPO | Source: Ambulatory Visit | Attending: Oncology | Admitting: Oncology

## 2020-02-10 DIAGNOSIS — Z9889 Other specified postprocedural states: Secondary | ICD-10-CM

## 2020-02-10 DIAGNOSIS — R928 Other abnormal and inconclusive findings on diagnostic imaging of breast: Secondary | ICD-10-CM | POA: Diagnosis not present

## 2020-02-12 ENCOUNTER — Ambulatory Visit: Payer: BC Managed Care – PPO | Attending: Internal Medicine

## 2020-02-12 DIAGNOSIS — Z23 Encounter for immunization: Secondary | ICD-10-CM | POA: Insufficient documentation

## 2020-02-12 NOTE — Progress Notes (Signed)
   Covid-19 Vaccination Clinic  Name:  RONAN BATAILLE    MRN: [id number] DOB: November 20, 1961  02/12/2020  Ms. Orey was observed post Covid-19 immunization for 15 minutes without incident. She was provided with Vaccine Information Sheet and instruction to access the V-Safe system.   Ms. Posa was instructed to call 911 with any severe reactions post vaccine: Marland Kitchen Difficulty breathing  . Swelling of face and throat  . A fast heartbeat  . A bad rash all over body  . Dizziness and weakness   Immunizations Administered    Name Date Dose VIS Date Route   Pfizer COVID-19 Vaccine 02/12/2020  8:56 AM 0.3 mL 11/19/2019 Intramuscular   Manufacturer: [place]   Lot: [id number]   [place]: [id number]

## 2020-03-04 ENCOUNTER — Ambulatory Visit: Payer: Self-pay

## 2020-03-04 ENCOUNTER — Ambulatory Visit: Payer: BC Managed Care – PPO | Attending: Internal Medicine

## 2020-03-04 DIAGNOSIS — Z23 Encounter for immunization: Secondary | ICD-10-CM

## 2020-03-04 NOTE — Progress Notes (Signed)
   Covid-19 Vaccination Clinic  Name:  Vanessa [name]    MRN: [id number] DOB: [birth year]  03/04/2020  [name] was observed post Covid-19 immunization for 15 minutes without incident. She was provided with Vaccine Information Sheet and instruction to access the V-Safe system.   [name] was instructed to call 911 with any severe reactions post vaccine: Marland Kitchen Difficulty breathing  . Swelling of face and throat  . A fast heartbeat  . A bad rash all over body  . Dizziness and weakness   Immunizations Administered    Name Date Dose VIS Date Route   Pfizer COVID-19 Vaccine 03/04/2020  9:04 AM 0.3 mL 11/19/2019 Intramuscular   Manufacturer: [place]   Lot: [id number]   [place]: [id number]

## 2020-03-21 DIAGNOSIS — M2550 Pain in unspecified joint: Secondary | ICD-10-CM | POA: Diagnosis not present

## 2020-03-21 DIAGNOSIS — Z840 Family history of diseases of the skin and subcutaneous tissue: Secondary | ICD-10-CM | POA: Diagnosis not present

## 2020-03-21 DIAGNOSIS — Z8261 Family history of arthritis: Secondary | ICD-10-CM | POA: Diagnosis not present

## 2020-05-02 ENCOUNTER — Other Ambulatory Visit: Payer: Self-pay | Admitting: Oncology

## 2020-05-02 DIAGNOSIS — M170 Bilateral primary osteoarthritis of knee: Secondary | ICD-10-CM | POA: Diagnosis not present

## 2020-05-02 DIAGNOSIS — M5136 Other intervertebral disc degeneration, lumbar region: Secondary | ICD-10-CM | POA: Diagnosis not present

## 2020-06-06 DIAGNOSIS — H47211 Primary optic atrophy, right eye: Secondary | ICD-10-CM | POA: Diagnosis not present

## 2020-06-06 DIAGNOSIS — H2513 Age-related nuclear cataract, bilateral: Secondary | ICD-10-CM | POA: Diagnosis not present

## 2020-07-17 DIAGNOSIS — G4719 Other hypersomnia: Secondary | ICD-10-CM | POA: Diagnosis not present

## 2020-07-17 DIAGNOSIS — M545 Low back pain: Secondary | ICD-10-CM | POA: Diagnosis not present

## 2020-07-27 ENCOUNTER — Other Ambulatory Visit: Payer: Self-pay | Admitting: Oncology

## 2020-08-04 DIAGNOSIS — M545 Low back pain: Secondary | ICD-10-CM | POA: Diagnosis not present

## 2020-08-05 DIAGNOSIS — G4733 Obstructive sleep apnea (adult) (pediatric): Secondary | ICD-10-CM | POA: Diagnosis not present

## 2020-08-08 DIAGNOSIS — M545 Low back pain: Secondary | ICD-10-CM | POA: Diagnosis not present

## 2020-08-11 DIAGNOSIS — M545 Low back pain: Secondary | ICD-10-CM | POA: Diagnosis not present

## 2020-08-17 ENCOUNTER — Other Ambulatory Visit: Payer: Self-pay | Admitting: Oncology

## 2020-08-30 DIAGNOSIS — G4733 Obstructive sleep apnea (adult) (pediatric): Secondary | ICD-10-CM | POA: Diagnosis not present

## 2020-09-06 ENCOUNTER — Other Ambulatory Visit: Payer: Self-pay

## 2020-09-06 ENCOUNTER — Inpatient Hospital Stay: Payer: BC Managed Care – PPO | Attending: Oncology | Admitting: Oncology

## 2020-09-06 ENCOUNTER — Inpatient Hospital Stay: Payer: BC Managed Care – PPO

## 2020-09-06 ENCOUNTER — Telehealth: Payer: Self-pay | Admitting: Oncology

## 2020-09-06 VITALS — BP 141/71 | HR 101 | Temp 98.1°F | Resp 18 | Ht 65.0 in | Wt 274.4 lb

## 2020-09-06 DIAGNOSIS — C50411 Malignant neoplasm of upper-outer quadrant of right female breast: Secondary | ICD-10-CM

## 2020-09-06 DIAGNOSIS — Z170 Estrogen receptor positive status [ER+]: Secondary | ICD-10-CM | POA: Diagnosis not present

## 2020-09-06 DIAGNOSIS — Z79810 Long term (current) use of selective estrogen receptor modulators (SERMs): Secondary | ICD-10-CM | POA: Insufficient documentation

## 2020-09-06 DIAGNOSIS — G629 Polyneuropathy, unspecified: Secondary | ICD-10-CM | POA: Diagnosis not present

## 2020-09-06 DIAGNOSIS — Z79899 Other long term (current) drug therapy: Secondary | ICD-10-CM | POA: Diagnosis not present

## 2020-09-06 DIAGNOSIS — I10 Essential (primary) hypertension: Secondary | ICD-10-CM

## 2020-09-06 LAB — CBC WITH DIFFERENTIAL/PLATELET
Abs Immature Granulocytes: 0.01 10*3/uL (ref 0.00–0.07)
Basophils Absolute: 0 10*3/uL (ref 0.0–0.1)
Basophils Relative: 1 %
Eosinophils Absolute: 0.1 10*3/uL (ref 0.0–0.5)
Eosinophils Relative: 3 %
HCT: 39 % (ref 36.0–46.0)
Hemoglobin: 12.5 g/dL (ref 12.0–15.0)
Immature Granulocytes: 0 %
Lymphocytes Relative: 45 %
Lymphs Abs: 1.9 10*3/uL (ref 0.7–4.0)
MCH: 27.6 pg (ref 26.0–34.0)
MCHC: 32.1 g/dL (ref 30.0–36.0)
MCV: 86.1 fL (ref 80.0–100.0)
Monocytes Absolute: 0.2 10*3/uL (ref 0.1–1.0)
Monocytes Relative: 5 %
Neutro Abs: 2 10*3/uL (ref 1.7–7.7)
Neutrophils Relative %: 46 %
Platelets: 231 10*3/uL (ref 150–400)
RBC: 4.53 MIL/uL (ref 3.87–5.11)
RDW: 13.2 % (ref 11.5–15.5)
WBC: 4.3 10*3/uL (ref 4.0–10.5)
nRBC: 0 % (ref 0.0–0.2)

## 2020-09-06 LAB — COMPREHENSIVE METABOLIC PANEL
ALT: 14 U/L (ref 0–44)
AST: 19 U/L (ref 15–41)
Albumin: 3.3 g/dL — ABNORMAL LOW (ref 3.5–5.0)
Alkaline Phosphatase: 84 U/L (ref 38–126)
Anion gap: 8 (ref 5–15)
BUN: 12 mg/dL (ref 6–20)
CO2: 29 mmol/L (ref 22–32)
Calcium: 8.9 mg/dL (ref 8.9–10.3)
Chloride: 107 mmol/L (ref 98–111)
Creatinine, Ser: 0.7 mg/dL (ref 0.44–1.00)
GFR calc Af Amer: >60 mL/min (ref >60)
GFR calc non Af Amer: >60 mL/min (ref >60)
Glucose, Bld: 99 mg/dL (ref 70–99)
Potassium: 3.2 mmol/L — ABNORMAL LOW (ref 3.5–5.1)
Sodium: 144 mmol/L (ref 135–145)
Total Bilirubin: 0.2 mg/dL — ABNORMAL LOW (ref 0.3–1.2)
Total Protein: 7.3 g/dL (ref 6.5–8.1)

## 2020-09-06 MED ORDER — TAMOXIFEN CITRATE 20 MG PO TABS: 20.0000 mg | ORAL_TABLET | Freq: Every day | ORAL | 4 refills | Status: DC

## 2020-09-06 NOTE — Progress Notes (Signed)
Vanessa [name]  [phone] Fax:([phone]     Vanessa [name] DOB: 1961/01/04  MR#: [id number]  STM#:[id number]  Patient Care Team: Aretta Nip, MD as PCP - General (Family Medicine) Erroll Luna, MD as Consulting Physician (General Surgery) Hadar Elgersma, Virgie Dad, MD as Consulting Physician (Oncology) Marcial Pacas, MD as Consulting Physician (Neurology) Gean Birchwood, DPM as Consulting Physician (Podiatry) Servando Salina, MD as Consulting Physician (Obstetrics and Gynecology) Eppie Gibson, MD as Attending Physician (Radiation Oncology) Larey Dresser, MD as Consulting Physician (Cardiology) Delice Bison, Charlestine Massed, NP as Nurse Practitioner (Hematology and Oncology) OTHER MD:  CHIEF COMPLAINT: Triple positive breast cancer  CURRENT TREATMENT: tamoxifen    INTERVAL HISTORY: Vanessa [name] returns today for follow-up of her triple positive breast cancer.   She continues on tamoxifen.  She tolerates this remarkably well but still is looking forward to a time when she will be off the medication  Since her last visit, she underwent bilateral diagnostic mammography with tomography at [facility] on 02/10/2020 showing: breast density category B; no evidence of malignancy in either breast.    REVIEW OF SYSTEMS: [name] is back to work at the [employer].  Part-time is at home and part-time in person.  There is at least one person in her work area who is not vaccinated.  This is a real concern.  She has gained weight during the pandemic but is now beginning to exercise again doing water walking and some stretching as well as using a recumbent bike.  She did get the [employer] vaccine x2.  She has been diagnosed with sleep apnea and is using CPAP successfully so far.  Her nails are becoming a little bit ridged.  She is concerned about that.  Otherwise a detailed review of systems today was stable   BREAST CANCER HISTORY: From the original intake  note:  Vanessa [name] had screening mammography at Dr. cousins office on 11/06/2015 suggesting a change in the right breast. She was scheduled for right diagnostic mammography and tomosynthesis with ultrasonography at the [facility] 11/13/2015. The breast density was category B. In the upper outer quadrant of the right breast there was a small irregular mass with suspicious internal calcifications. By exam this was small, mobile, firm, and located at the 10:00 position 7 cm from the nipple. Ultrasound of the right breast confirmed an irregularly marginated hypoechoic mass measuring 1.1 cm. The right axilla was sonographically benign.  Biopsy of the right breast mass in question 11/15/2015 showed ([id number]) an invasive ductal carcinoma, grade 2, estrogen receptor 100% positive, progesterone receptor 2% positive, both with strong staining intensity, with an MIB-1 of 70%, and HER-2 amplification, the signals ratio being 4.92 and the number per cell 17.45.  The patient's subsequent history is as detailed below.   PAST MEDICAL HISTORY: Past Medical History:  Diagnosis Date  . Anxiety   . Arthritis    knees  . Breast cancer ([facility]) 12/2015   right breast  . Cancer ([place])   . Family history of breast cancer   . Family history of colon cancer   . Family history of thyroid cancer   . History of breast cancer 2017   right  . History of chemotherapy 2017  . History of trigeminal neuralgia   . Personal history of chemotherapy 2017  . Personal history of radiation therapy 2017  . PONV (postoperative nausea and vomiting)   . Wears partial dentures    upper    PAST SURGICAL HISTORY:  Past Surgical History:  Procedure Laterality Date  . BREAST LUMPECTOMY Right 12/2015  . BREAST LUMPECTOMY WITH RADIOACTIVE SEED AND SENTINEL LYMPH NODE BIOPSY Right 12/29/2015   Procedure: RIGHT BREAST  RADIOACTIVE SEED LOCALIZATION LUMPECTOMY AND RIGHT SENTINEL LYMPH NODE MAPPING;  Surgeon: Erroll Luna, MD;  Location:  [facility];  Service: General;  Laterality: Right;  . CYSTOSCOPY  06/28/2010  . KNEE ARTHROSCOPY Left 07/11/2011  . LESION EXCISION Bilateral 01/20/2014   upper and lower eyelids  . PORT-A-CATH REMOVAL Right 02/18/2017   Procedure: REMOVAL PORT-A-CATH;  Surgeon: Erroll Luna, MD;  Location: [facility];  Service: General;  Laterality: Right;  . PORTACATH PLACEMENT N/A 12/29/2015   Procedure: INSERTION PORT-A-CATH;  Surgeon: Erroll Luna, MD;  Location: [facility];  Service: General;  Laterality: N/A;  . TOTAL ABDOMINAL HYSTERECTOMY W/ BILATERAL SALPINGOOPHORECTOMY  06/28/2010    FAMILY HISTORY Family History  Problem Relation Age of Onset  . Diabetes Mother   . Hypertension Mother   . Asthma Daughter   . Diabetes Sister   . Hypertension Sister   . Colon cancer Sister 34  . Thyroid cancer Sister 74  . Lung cancer Father   . Breast cancer Cousin        maternal first cousin  . Breast cancer Cousin 37       maternal first cousin - inflammatory   the patient's father died from lung cancer at the age of 19, in the setting of tobacco abuse. The patient's mother is living, at age 78. The patient had 2 brothers, 3 sisters. One sister was diagnosed with colon cancer in her 68s and later thyroid cancer. She died from metastatic disease. One brother died with pulmonary problems. On the maternal side a cousin was diagnosed with inflammatory breast cancer at the age of 25. A second cousin on the maternal side was diagnosed with breast cancer in her early 81s. There is no history of ovarian cancer in the family.   GYNECOLOGIC HISTORY:  No LMP recorded. Patient is postmenopausal. Menarche age 59, first live birth age 80, the patient is Villarreal P2. She underwent simple hysterectomy with bilateral salpingectomy 06/28/2010, with benign pathology([id number]). She still has her ovaries in place. 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  She lives alone.  She describes herself is single.  Her daughter Vanessa [name] teaches theater at [employer] and is currently studying towards a Masters in counseling; and her son [name], who is a Education officer, environmental in the international circuit (currently in [place] [employer]. The patient has no grandchildren. She attends a [employer]    ADVANCED DIRECTIVES: Not in place. the patient was given the appropriate forms to complete and notarize at her discretion.   HEALTH MAINTENANCE: Social History   Tobacco Use  . Smoking status: Never Smoker  . Smokeless tobacco: Never Used  Substance Use Topics  . Alcohol use: No  . Drug use: No     Colonoscopy: 2016/[facility]  PAP:  Bone density:06/30/2017 showed a T score of -0.2 normal  Lipid panel:  Allergies  Allergen Reactions  . Amoxicillin-Pot Clavulanate Anaphylaxis  . Penicillins Anaphylaxis       . Adhesive [Tape] Other (See Comments)    TEARS SKIN  . Hydrocodone Itching    CAN TAKE WITH BENADRYL  . Meperidine Hives and Swelling  . Cefaclor Rash  .  Codeine Rash  . Darvocet [Propoxyphene N-Acetaminophen] Rash  . Erythromycin Rash  . Flexeril [Cyclobenzaprine Hcl] Rash  . Percocet [Oxycodone-Acetaminophen] Itching and Rash  . Sulfa Antibiotics Rash    Current Outpatient Medications  Medication Sig Dispense Refill  . acetaminophen (TYLENOL) 325 MG tablet Take 650 mg by mouth every 6 (six) hours as needed for mild pain or moderate pain. Reported on 04/18/2016    . b complex vitamins tablet Take 1 tablet by mouth daily. Reported on 06/14/2016    . cetirizine (ZYRTEC) 10 MG tablet Take 10 mg by mouth daily as needed for allergies. Reported on 06/14/2016    . diclofenac (VOLTAREN) 75 MG EC tablet Take 1 tablet (75 mg total) by mouth 2 (two) times daily.    Marland Kitchen gabapentin (NEURONTIN) 300 MG  capsule TAKE 1 CAPSULE AT BEDTIME TOGETHER WITH A 600 MG CAPSULE TO MAKE 900 MG AT BEDTIME(RF ON 4/7) 90 capsule 0  . gabapentin (NEURONTIN) 600 MG tablet TAKE 1 TABLET (600 MG TOTAL) BY MOUTH AT BEDTIME. 90 tablet 4  . Multiple Vitamins-Minerals (CENTRUM SILVER ADULT 50+ PO) Take 1 capsule by mouth daily. Reported on 03/14/2016    . tamoxifen (NOLVADEX) 20 MG tablet TAKE 1 TABLET BY MOUTH EVERY DAY 90 tablet 3  . traMADol (ULTRAM) 50 MG tablet Take 1 tablet (50 mg total) by mouth 2 (two) times daily as needed. 50 tablet 0   No current facility-administered medications for this visit.    OBJECTIVE: African-American woman in no acute distress  Vitals:   09/06/20 1403  BP: (!) 141/71  Pulse: (!) 101  Resp: 18  Temp: 98.1 F (36.7 C)  SpO2: 100%   Wt Readings from Last 3 Encounters:  09/06/20 274 lb 6.4 oz (124.5 kg)  09/07/19 279 lb 8 oz (126.8 kg)  03/17/18 253 lb 1.6 oz (114.8 kg)   Body mass index is 45.66 kg/m.    ECOG FS:1 - Symptomatic but completely ambulatory  Sclerae unicteric, EOMs intact Wearing a mask No cervical or supraclavicular adenopathy Lungs no rales or rhonchi Heart regular rate and rhythm Abd soft, obese, nontender, positive bowel sounds MSK no focal spinal tenderness, no upper extremity lymphedema Neuro: nonfocal, well oriented, appropriate affect Breasts: The right breast is status post lumpectomy and radiation.  There is no evidence of local recurrence.  Both axillae are benign.   LAB RESULTS:  CMP     Component Value Date/Time   NA 143 09/07/2019 1527   NA 143 03/06/2017 0924   K 3.4 (L) 09/07/2019 1527   K 3.6 03/06/2017 0924   CL 107 09/07/2019 1527   CO2 28 09/07/2019 1527   CO2 28 03/06/2017 0924   GLUCOSE 143 (H) 09/07/2019 1527   GLUCOSE 95 03/06/2017 0924   BUN 17 09/07/2019 1527   BUN 12.6 03/06/2017 0924   CREATININE 0.79 09/07/2019 1527   CREATININE 0.73 03/17/2018 0827   CREATININE 0.7 03/06/2017 0924   CALCIUM 8.8 (L)  09/07/2019 1527   CALCIUM 8.9 03/06/2017 0924   PROT 7.0 09/07/2019 1527   PROT 6.9 03/06/2017 0924   ALBUMIN 3.5 09/07/2019 1527   ALBUMIN 3.3 (L) 03/06/2017 0924   AST 21 09/07/2019 1527   AST 20 03/17/2018 0827   AST 20 03/06/2017 0924   ALT 18 09/07/2019 1527   ALT 18 03/17/2018 0827   ALT 20 03/06/2017 0924   ALKPHOS 74 09/07/2019 1527   ALKPHOS 80 03/06/2017 0924   BILITOT 0.3 09/07/2019 1527   BILITOT  0.3 03/17/2018 0827   BILITOT 0.41 03/06/2017 0924   GFRNONAA >60 09/07/2019 1527   GFRNONAA >60 03/17/2018 0827   GFRAA >60 09/07/2019 1527   GFRAA >60 03/17/2018 0827    INo results found for: SPEP, UPEP  Lab Results  Component Value Date   WBC 4.3 09/06/2020   NEUTROABS 2.0 09/06/2020   HGB 12.5 09/06/2020   HCT 39.0 09/06/2020   MCV 86.1 09/06/2020   PLT 231 09/06/2020      Chemistry      Component Value Date/Time   NA 143 09/07/2019 1527   NA 143 03/06/2017 0924   K 3.4 (L) 09/07/2019 1527   K 3.6 03/06/2017 0924   CL 107 09/07/2019 1527   CO2 28 09/07/2019 1527   CO2 28 03/06/2017 0924   BUN 17 09/07/2019 1527   BUN 12.6 03/06/2017 0924   CREATININE 0.79 09/07/2019 1527   CREATININE 0.73 03/17/2018 0827   CREATININE 0.7 03/06/2017 0924      Component Value Date/Time   CALCIUM 8.8 (L) 09/07/2019 1527   CALCIUM 8.9 03/06/2017 0924   ALKPHOS 74 09/07/2019 1527   ALKPHOS 80 03/06/2017 0924   AST 21 09/07/2019 1527   AST 20 03/17/2018 0827   AST 20 03/06/2017 0924   ALT 18 09/07/2019 1527   ALT 18 03/17/2018 0827   ALT 20 03/06/2017 0924   BILITOT 0.3 09/07/2019 1527   BILITOT 0.3 03/17/2018 0827   BILITOT 0.41 03/06/2017 0924      No results found for: LABCA2  No components found for: LABCA125  No results for input(s): INR in the last 168 hours.  Urinalysis No results found for: COLORURINE, APPEARANCEUR, LABSPEC, PHURINE, GLUCOSEU, HGBUR, BILIRUBINUR, KETONESUR, PROTEINUR, UROBILINOGEN, NITRITE, LEUKOCYTESUR   STUDIES: No results  found.   ASSESSMENT: 59 y.o. [place] woman status post right breast upper outer quadrant biopsy 11/15/2015 for a clinical T1c N0, stage IA invasive ductal carcinoma, grade 2, estrogen and progesterone receptor positive, HER-2 amplified, with an MIB-1 of 70%  (1) Genetics testing 12/22/2015 through the Hereditary Gene Panel offered by [facility] found no deleterious mutations in  APC, ATM, AXIN2, BARD1, BMPR1A, BRCA1, BRCA2, BRIP1, CDH1, CDKN2A, CHEK2, DICER1, EPCAM, GREM1, KIT, MEN1, MLH1, MSH2, MSH6, MUTYH, NBN, NF1, PALB2, PDGFRA, PMS2, POLD1, POLE, PTEN, RAD50, RAD51C, RAD51D, SDHA, SDHB, SDHC, SDHD, SMAD4, SMARCA4. STK11, TP53, TSC1, TSC2, and VHL.  (2) right lumpectomy with sentinel lymph node sampling 12/29/2015 showed a pT1c pN0, stage IA invasive ductal carcinoma, grade 3, with negative though close margins (19m to DCIS)  (3) adjuvant chemotherapy with weekly Abraxane 11 given 01/25/2016 through 03/28/2016 with trastuzumab every 3 weeks started 01/25/2016  (a) 12th Abraxane cycle omitted because of peripheral neuropathy  (4) trastuzumab continued to total one year (last dose 12/05/2016)  (a) echo 02/25/2017 showed an ejection fraction of 60-65 %  (5) adjuvant radiation completed 06/27/2016  (6) tamoxifen started 08/09/2016  (a) s/p hysterectomy July 2011   PLAN: KMaryjois now 4-1/2 years out from definitive surgery for her breast cancer with no evidence of disease recurrence.  This is very favorable.  She continues on tamoxifen with good tolerance and the plan is to continue that a total of 5 years which will take u[place]ato September 2022.  At that point she will be ready to "graduate" from follow.  I suggested she continue her potassium supplementation until we have the labs from today.  If her potassium today is over 4 she probably does not need it.  I commended her exercise program and encouraged her to increase it if possible.  Total encounter time 25 minutes.  Magrinat,  Virgie Dad, MD  09/06/20 2:13 PM Medical Oncology and Hematology [facility] [place], [place] [address] Tel. [phone]    Fax. [phone]   I, Wilburn Mylar, am acting as scribe for Dr. Virgie Dad. Magrinat.  I, Lurline Del MD, have reviewed the above documentation for accuracy and completeness, and I agree with the above.    *Total Encounter Time as defined by the Centers for Medicare and Medicaid Services includes, in addition to the face-to-face time of a patient visit (documented in the note above) non-face-to-face time: obtaining and reviewing outside history, ordering and reviewing medications, tests or procedures, care coordination (communications with other health care professionals or caregivers) and documentation in the medical record.

## 2020-09-06 NOTE — Telephone Encounter (Signed)
Scheduled appointments per 9/29 los. Gave patient calendar print out.

## 2020-09-13 DIAGNOSIS — Z03818 Encounter for observation for suspected exposure to other biological agents ruled out: Secondary | ICD-10-CM | POA: Diagnosis not present

## 2020-09-29 DIAGNOSIS — G4733 Obstructive sleep apnea (adult) (pediatric): Secondary | ICD-10-CM | POA: Diagnosis not present

## 2020-10-17 DIAGNOSIS — Z0000 Encounter for general adult medical examination without abnormal findings: Secondary | ICD-10-CM | POA: Diagnosis not present

## 2020-10-17 DIAGNOSIS — Z13220 Encounter for screening for lipoid disorders: Secondary | ICD-10-CM | POA: Diagnosis not present

## 2020-10-17 DIAGNOSIS — E876 Hypokalemia: Secondary | ICD-10-CM | POA: Diagnosis not present

## 2020-10-30 ENCOUNTER — Other Ambulatory Visit: Payer: Self-pay | Admitting: Oncology

## 2020-10-30 DIAGNOSIS — G4733 Obstructive sleep apnea (adult) (pediatric): Secondary | ICD-10-CM | POA: Diagnosis not present

## 2020-11-13 DIAGNOSIS — G4733 Obstructive sleep apnea (adult) (pediatric): Secondary | ICD-10-CM | POA: Diagnosis not present

## 2020-11-29 DIAGNOSIS — G4733 Obstructive sleep apnea (adult) (pediatric): Secondary | ICD-10-CM | POA: Diagnosis not present

## 2020-12-05 DIAGNOSIS — H2513 Age-related nuclear cataract, bilateral: Secondary | ICD-10-CM | POA: Diagnosis not present

## 2020-12-05 DIAGNOSIS — H47211 Primary optic atrophy, right eye: Secondary | ICD-10-CM | POA: Diagnosis not present

## 2020-12-09 DIAGNOSIS — Z20828 Contact with and (suspected) exposure to other viral communicable diseases: Secondary | ICD-10-CM | POA: Diagnosis not present

## 2020-12-13 ENCOUNTER — Telehealth: Payer: BC Managed Care – PPO | Admitting: Physician Assistant

## 2020-12-13 DIAGNOSIS — J069 Acute upper respiratory infection, unspecified: Secondary | ICD-10-CM | POA: Diagnosis not present

## 2020-12-13 DIAGNOSIS — Z20822 Contact with and (suspected) exposure to covid-19: Secondary | ICD-10-CM

## 2020-12-13 DIAGNOSIS — J0110 Acute frontal sinusitis, unspecified: Secondary | ICD-10-CM | POA: Diagnosis not present

## 2020-12-13 LAB — POC COVID19 BINAXNOW: SARS Coronavirus 2 Ag: NEGATIVE

## 2020-12-13 MED ORDER — FLUTICASONE PROPIONATE 50 MCG/ACT NA SUSP: 2.0000 | Freq: Every day | NASAL | 6 refills | Status: AC

## 2020-12-13 MED ORDER — BENZONATATE 100 MG PO CAPS: 100.0000 mg | ORAL_CAPSULE | Freq: Two times a day (BID) | ORAL | 0 refills | Status: AC | PRN

## 2020-12-13 NOTE — Progress Notes (Unsigned)
New Patient Office Visit  Subjective:  Patient ID: Vanessa [name], female    DOB: [birth year]  Age: 60 y.o. MRN: [id number]  CC: No chief complaint on file.  Virtual Visit via Telephone Note  I connected with Vanessa [name] on 12/13/20 at 11:15 AM EST by telephone and verified that I am speaking with the correct person using two identifiers.  Location: Patient: Home Provider: [facility] Medicine [facility]   I discussed the limitations, risks, security and privacy concerns of performing an evaluation and management service by telephone and the availability of in person appointments. I also discussed with the patient that there may be a patient responsible charge related to this service. The patient expressed understanding and agreed to proceed.   History of Present Illness: Reports that she has been having a headache, a scratchy throat, sinus pressure, nasal congestion with thick white discharge, cough with yellow sputum, muscle aches and 2 episodes of diarrhea.  Reports that her symptoms started on 12/08/2020, states that she had last episode of diarrhea earlier today.  Reports the cough mostly occurs in the morning, is not keeping her awake at night  Reports that she has been using meloxicam, Tylenol without much relief no vomiting.  Does take Zyrtec on a daily basis  No known sick contacts, reports history of 3 Pfizer vaccines with booster September 22, 2020, reports flu vaccine early October 2021   Observations/Objective:  Medical history and current medications reviewed, no physical exam completed   Past Medical History:  Diagnosis Date  . Anxiety   . Arthritis    knees  . Breast cancer ([facility]) 12/2015   right breast  . Cancer ([facility])   . Family history of breast cancer   . Family history of colon cancer   . Family history of thyroid cancer   . History of breast cancer 2017   right  . History of chemotherapy 2017  . History of trigeminal neuralgia   . Personal  history of chemotherapy 2017  . Personal history of radiation therapy 2017  . PONV (postoperative nausea and vomiting)   . Wears partial dentures    upper    Past Surgical History:  Procedure Laterality Date  . BREAST LUMPECTOMY Right 12/2015  . BREAST LUMPECTOMY WITH RADIOACTIVE SEED AND SENTINEL LYMPH NODE BIOPSY Right 12/29/2015   Procedure: RIGHT BREAST  RADIOACTIVE SEED LOCALIZATION LUMPECTOMY AND RIGHT SENTINEL LYMPH NODE MAPPING;  Surgeon: Harriette Bouillon, MD;  Location: [facility];  Service: General;  Laterality: Right;  . CYSTOSCOPY  06/28/2010  . KNEE ARTHROSCOPY Left 07/11/2011  . LESION EXCISION Bilateral 01/20/2014   upper and lower eyelids  . PORT-A-CATH REMOVAL Right 02/18/2017   Procedure: REMOVAL PORT-A-CATH;  Surgeon: Harriette Bouillon, MD;  Location: [facility];  Service: General;  Laterality: Right;  . PORTACATH PLACEMENT N/A 12/29/2015   Procedure: INSERTION PORT-A-CATH;  Surgeon: Harriette Bouillon, MD;  Location: [facility];  Service: General;  Laterality: N/A;  . TOTAL ABDOMINAL HYSTERECTOMY W/ BILATERAL SALPINGOOPHORECTOMY  06/28/2010    Family History  Problem Relation Age of Onset  . Diabetes Mother   . Hypertension Mother   . Asthma Daughter   . Diabetes Sister   . Hypertension Sister   . Colon cancer Sister 43  . Thyroid cancer Sister 57  . Lung cancer Father   . Breast cancer Cousin        maternal first cousin  . Breast cancer Cousin 63  maternal first cousin - inflammatory    Social History   Socioeconomic History  . Marital status: Single    Spouse name: Not on file  . Number of children: 2  . Years of education: 12+  . Highest education level: Not on file  Occupational History    Employer: [employer]  Tobacco Use  . Smoking status: Never Smoker  . Smokeless tobacco: Never Used  Substance and Sexual Activity  . Alcohol use: No  . Drug use: No  . Sexual activity: Never  Other Topics Concern  .  Not on file  Social History Narrative   Patient is single.    Patient has 2 children.    Patient is right handed    Patient [employer] in the [employer] office         Social Determinants of Health   Financial Resource Strain: Not on file  Food Insecurity: Not on file  Transportation Needs: Not on file  Physical Activity: Not on file  Stress: Not on file  Social Connections: Not on file  Intimate Partner Violence: Not on file    ROS Review of Systems  Constitutional: Negative for chills, fatigue and fever.  HENT: Positive for congestion, sinus pressure and sore throat. Negative for trouble swallowing.   Eyes: Negative.   Respiratory: Positive for cough. Negative for shortness of breath and wheezing.   Cardiovascular: Negative for chest pain and palpitations.  Gastrointestinal: Positive for diarrhea. Negative for nausea and vomiting.  Endocrine: Negative.   Genitourinary: Negative.   Musculoskeletal: Positive for myalgias.  Skin: Negative.   Allergic/Immunologic: Negative.   Neurological: Positive for headaches.  Hematological: Negative.   Psychiatric/Behavioral: Negative.     Objective:   Today's Vitals: There were no vitals taken for this visit.    Assessment & Plan:   Problem List Items Addressed This Visit   None   Visit Diagnoses    Upper respiratory tract infection, unspecified type    -  Primary   Relevant Orders   POC COVID-19      Outpatient Encounter Medications as of 12/13/2020  Medication Sig  . acetaminophen (TYLENOL) 325 MG tablet Take 650 mg by mouth every 6 (six) hours as needed for mild pain or moderate pain. Reported on 04/18/2016  . b complex vitamins tablet Take 1 tablet by mouth daily. Reported on 06/14/2016  . cetirizine (ZYRTEC) 10 MG tablet Take 10 mg by mouth daily as needed for allergies. Reported on 06/14/2016  . gabapentin (NEURONTIN) 300 MG capsule TAKE 1 CAPSULE AT BEDTIME TOGETHER WITH A 600 MG CAPSULE TO MAKE 900 MG AT BEDTIME(RF  ON 4/7)  . gabapentin (NEURONTIN) 600 MG tablet TAKE 1 TABLET (600 MG TOTAL) BY MOUTH AT BEDTIME.  . Meloxicam 15 MG TBDP Take by mouth.  . Multiple Vitamins-Minerals (CENTRUM SILVER ADULT 50+ PO) Take 1 capsule by mouth daily. Reported on 03/14/2016  . tamoxifen (NOLVADEX) 20 MG tablet Take 1 tablet (20 mg total) by mouth daily.  . [DISCONTINUED] traMADol (ULTRAM) 50 MG tablet Take 1 tablet (50 mg total) by mouth 2 (two) times daily as needed. (Patient not taking: Reported on 12/13/2020)   No facility-administered encounter medications on file as of 12/13/2020.   Assessment and Plan: 1. Upper respiratory tract infection, unspecified type Rapid Covid testing negative, patient education given on increasing rest, proper hydration, trial ibuprofen over-the-counter, hold meloxicam while taking  iburofen, trial Tessalon Perles, Flonase - POC COVID-19 - benzonatate (TESSALON) 100 MG capsule; Take 1 capsule (  100 mg total) by mouth 2 (two) times daily as needed for cough.  Dispense: 20 capsule; Refill: 0 - fluticasone (FLONASE) 50 MCG/ACT nasal spray; Place 2 sprays into both nostrils daily.  Dispense: 16 g; Refill: 6  2. Acute non-recurrent frontal sinusitis  - fluticasone (FLONASE) 50 MCG/ACT nasal spray; Place 2 sprays into both nostrils daily.  Dispense: 16 g; Refill: 6   Follow Up Instructions:    I discussed the assessment and treatment plan with the patient. The patient was provided an opportunity to ask questions and all were answered. The patient agreed with the plan and demonstrated an understanding of the instructions.   The patient was advised to call back or seek an in-person evaluation if the symptoms worsen or if the condition fails to improve as anticipated.  I provided 21 minutes of non-face-to-face time during this encounter.     Follow-up: No follow-ups on file.   Loraine Grip Mclane Arora, PA-C

## 2020-12-13 NOTE — Progress Notes (Signed)
Patient complains of waking up at 4 am on 12/08/20 with a HA and a scratchy throat with runny nose. Patient took tylenol with no relief. Patient N/V/ patient had last diarrhea episode this morning since last Saturday. Patient had a 99.3 with an increase at night.  Patient has productive cough with yellow color. Patient complains of muscle aches. Patient denies any contact and has been working from home since last Wednesday. Patient went to a funeral that Wednesday.

## 2020-12-14 DIAGNOSIS — J069 Acute upper respiratory infection, unspecified: Secondary | ICD-10-CM | POA: Insufficient documentation

## 2020-12-14 NOTE — Patient Instructions (Signed)
Your rapid Covid test was negative, I encourage you to continue working on increasing rest and proper hydration.  I encourage you to try ibuprofen over-the-counter but do not take meloxicam at the same time.  I sent a prescription for Tessalon Perles and Flonase to your pharmacy.  I hope that you feel better soon, please let us know if there is anything else we can do for you.  Kennieth Rad, PA-C Physician Assistant [facility] [email or web address]    Sinusitis, Adult Sinusitis is inflammation of your sinuses. Sinuses are hollow spaces in the bones around your face. Your sinuses are located:  Around your eyes.  In the middle of your forehead.  Behind your nose.  In your cheekbones. Mucus normally drains out of your sinuses. When your nasal tissues become inflamed or swollen, mucus can become trapped or blocked. This allows bacteria, viruses, and fungi to grow, which leads to infection. Most infections of the sinuses are caused by a virus. Sinusitis can develop quickly. It can last for up to 4 weeks (acute) or for more than 12 weeks (chronic). Sinusitis often develops after a cold. What are the causes? This condition is caused by anything that creates swelling in the sinuses or stops mucus from draining. This includes:  Allergies.  Asthma.  Infection from bacteria or viruses.  Deformities or blockages in your nose or sinuses.  Abnormal growths in the nose (nasal polyps).  Pollutants, such as chemicals or irritants in the air.  Infection from fungi (rare). What increases the risk? You are more likely to develop this condition if you:  Have a weak body defense system (immune system).  Do a lot of swimming or diving.  Overuse nasal sprays.  Smoke. What are the signs or symptoms? The main symptoms of this condition are pain and a feeling of pressure around the affected sinuses. Other symptoms include:  Stuffy nose  or congestion.  Thick drainage from your nose.  Swelling and warmth over the affected sinuses.  Headache.  Upper toothache.  A cough that may get worse at night.  Extra mucus that collects in the throat or the back of the nose (postnasal drip).  Decreased sense of smell and taste.  Fatigue.  A fever.  Sore throat.  Bad breath. How is this diagnosed? This condition is diagnosed based on:  Your symptoms.  Your medical history.  A physical exam.  Tests to find out if your condition is acute or chronic. This may include: ? Checking your nose for nasal polyps. ? Viewing your sinuses using a device that has a light (endoscope). ? Testing for allergies or bacteria. ? Imaging tests, such as an MRI or CT scan. In rare cases, a bone biopsy may be done to rule out more serious types of fungal sinus disease. How is this treated? Treatment for sinusitis depends on the cause and whether your condition is chronic or acute.  If caused by a virus, your symptoms should go away on their own within 10 days. You may be given medicines to relieve symptoms. They include: ? Medicines that shrink swollen nasal passages (topical intranasal decongestants). ? Medicines that treat allergies (antihistamines). ? A spray that eases inflammation of the nostrils (topical intranasal corticosteroids). ? Rinses that help get rid of thick mucus in your nose (nasal saline washes).  If caused by bacteria, your health care provider may recommend waiting to see if your symptoms improve. Most bacterial infections will get better without antibiotic medicine. You may be  given antibiotics if you have: ? A severe infection. ? A weak immune system.  If caused by narrow nasal passages or nasal polyps, you may need to have surgery. Follow these instructions at home: Medicines  Take, use, or apply over-the-counter and prescription medicines only as told by your health care provider. These may include nasal  sprays.  If you were prescribed an antibiotic medicine, take it as told by your health care provider. Do not stop taking the antibiotic even if you start to feel better. Hydrate and humidify   Drink enough fluid to keep your urine pale yellow. Staying hydrated will help to thin your mucus.  Use a cool mist humidifier to keep the humidity level in your home above 50%.  Inhale steam for 10-15 minutes, 3-4 times a day, or as told by your health care provider. You can do this in the bathroom while a hot shower is running.  Limit your exposure to cool or dry air. Rest  Rest as much as possible.  Sleep with your head raised (elevated).  Make sure you get enough sleep each night. General instructions   Apply a warm, moist washcloth to your face 3-4 times a day or as told by your health care provider. This will help with discomfort.  Wash your hands often with soap and water to reduce your exposure to germs. If soap and water are not available, use hand sanitizer.  Do not smoke. Avoid being around people who are smoking (secondhand smoke).  Keep all follow-up visits as told by your health care provider. This is important. Contact a health care provider if:  You have a fever.  Your symptoms get worse.  Your symptoms do not improve within 10 days. Get help right away if:  You have a severe headache.  You have persistent vomiting.  You have severe pain or swelling around your face or eyes.  You have vision problems.  You develop confusion.  Your neck is stiff.  You have trouble breathing. Summary  Sinusitis is soreness and inflammation of your sinuses. Sinuses are hollow spaces in the bones around your face.  This condition is caused by nasal tissues that become inflamed or swollen. The swelling traps or blocks the flow of mucus. This allows bacteria, viruses, and fungi to grow, which leads to infection.  If you were prescribed an antibiotic medicine, take it as told by  your health care provider. Do not stop taking the antibiotic even if you start to feel better.  Keep all follow-up visits as told by your health care provider. This is important. This information is not intended to replace advice given to you by your health care provider. Make sure you discuss any questions you have with your health care provider. Document Revised: 04/27/2018 Document Reviewed: 04/27/2018 Elsevier Patient Education  2020 [employer].

## 2020-12-29 DIAGNOSIS — G4733 Obstructive sleep apnea (adult) (pediatric): Secondary | ICD-10-CM | POA: Diagnosis not present

## 2021-01-15 DIAGNOSIS — Z01419 Encounter for gynecological examination (general) (routine) without abnormal findings: Secondary | ICD-10-CM | POA: Diagnosis not present

## 2021-01-31 DIAGNOSIS — C50911 Malignant neoplasm of unspecified site of right female breast: Secondary | ICD-10-CM | POA: Diagnosis not present

## 2021-02-24 ENCOUNTER — Other Ambulatory Visit: Payer: Self-pay | Admitting: Oncology

## 2021-02-26 DIAGNOSIS — R030 Elevated blood-pressure reading, without diagnosis of hypertension: Secondary | ICD-10-CM | POA: Diagnosis not present

## 2021-02-26 DIAGNOSIS — G43111 Migraine with aura, intractable, with status migrainosus: Secondary | ICD-10-CM | POA: Diagnosis not present

## 2021-03-02 ENCOUNTER — Other Ambulatory Visit: Payer: Self-pay | Admitting: Oncology

## 2021-03-02 DIAGNOSIS — M5136 Other intervertebral disc degeneration, lumbar region: Secondary | ICD-10-CM | POA: Diagnosis not present

## 2021-03-02 DIAGNOSIS — Z9889 Other specified postprocedural states: Secondary | ICD-10-CM

## 2021-03-05 DIAGNOSIS — G43111 Migraine with aura, intractable, with status migrainosus: Secondary | ICD-10-CM | POA: Diagnosis not present

## 2021-03-05 DIAGNOSIS — G4733 Obstructive sleep apnea (adult) (pediatric): Secondary | ICD-10-CM | POA: Diagnosis not present

## 2021-03-05 DIAGNOSIS — I10 Essential (primary) hypertension: Secondary | ICD-10-CM | POA: Diagnosis not present

## 2021-03-05 DIAGNOSIS — R739 Hyperglycemia, unspecified: Secondary | ICD-10-CM | POA: Diagnosis not present

## 2021-03-21 ENCOUNTER — Encounter: Payer: Self-pay | Admitting: Neurology

## 2021-03-21 ENCOUNTER — Ambulatory Visit: Payer: BC Managed Care – PPO | Admitting: Neurology

## 2021-03-21 VITALS — BP 125/71 | HR 99 | Ht 65.0 in | Wt 278.0 lb

## 2021-03-21 DIAGNOSIS — G43709 Chronic migraine without aura, not intractable, without status migrainosus: Secondary | ICD-10-CM

## 2021-03-21 MED ORDER — TOPIRAMATE 50 MG PO TABS
50.0000 mg | ORAL_TABLET | Freq: Every day | ORAL | 3 refills | Status: DC
Start: 2021-03-21 — End: 2022-02-13

## 2021-03-21 MED ORDER — RIZATRIPTAN BENZOATE 5 MG PO TBDP: 5.0000 mg | ORAL_TABLET | ORAL | 11 refills | Status: DC | PRN

## 2021-03-21 MED ORDER — GABAPENTIN 300 MG PO CAPS: 300.0000 mg | ORAL_CAPSULE | Freq: Three times a day (TID) | ORAL | 3 refills | Status: DC

## 2021-03-21 MED ORDER — ONDANSETRON 4 MG PO TBDP: 4.0000 mg | ORAL_TABLET | Freq: Three times a day (TID) | ORAL | 6 refills | Status: DC | PRN

## 2021-03-21 NOTE — Progress Notes (Addendum)
Chief Complaint  Patient presents with  . New Patient (Initial Visit)    Hx of migraines. She was getting frequent headaches prior to being placed on topiramate. She was initially started on topiramate 50mg  QHS titrating up to 100mg  QHS. The higher dose made her feel "loopy". She is now back to 50mg  QHS. Feels the lower dose is managing the headaches well. She has not had a severe headache in several weeks. She uses Tramadol 50mg  prn for pain. She has been using more due to recently having teeth extracted.       ASSESSMENT AND PLAN  Vanessa [name] is a 60 y.o. female   Chronic migraine headaches History of trigeminal neuralgia  Continue Topamax 50 mg every night as preventive medications  Gabapentin 6 to 900 mg for sleep, and also has migraine prevention  Maxalt 5 mg as needed, may combine with Zofran, Aleve for severe prolonged headaches  DIAGNOSTIC DATA (LABS, IMAGING, TESTING) - I reviewed patient records, labs, notes, testing and imaging myself where available. MRI of the brain with without contrast on May 24, 2015 was normal  HISTORICAL  Vanessa [name] is a 60 year old female, seen in request by her primary care physician Dr. Radene Ou, [place], for evaluation of migraine headaches on March 21, 2021  I reviewed and summarized the referring note. Right breat cancer in 2016, had right lobectomy following the chemo and radiation therapy, on tamoxifen treatment HTN OSA- using CPAP. Obesity  I saw her previously in 2016 for right trigeminal neuralgia, involving right V1, V2 territory, improved with gabapentin treatment, MRI of the brain with without contrast was normal at that time  She later had right lobectomy for right breast cancer, followed by chemo and radiation therapy, has kept on gabapentin 300+600 mg every night for sleep, hot flash  She reported long history of migraine headaches, most severe between age 2 to 53s, her typical migraine often preceded by visual  distortion, retro-orbital area severe pounding headache with light noise sensitivity, movement made it worse, nausea, her headache has much improved after age 60  Then she began to have frequent headaches again since 2020, concurrent with her irregular sleeping eating habit weight gain, sometimes she woke up with severe headaches, the most recent one was a month ago in March 2022, bilateral retro-orbital area severe pounding headache, noise light smell sensitivity, lasting for whole day, did not relieved by sleep, and over-the-counter Zyrtec D plus Tylenol for arthritis  She was started on Topamax 50 mg every night since March 2022, higher dose 100 mg every night cause mental confusion, now back down to 50 mg every night, she reported mild to moderate improvement, and had toned down her headache, she has less headache, less severe,  She had left tooth extraction on February 22, 2021, but did not trigger her headache  She also complains of multiple joint pain, taking Mobic 15 mg daily  REVIEW OF SYSTEMS:  Full 14 system review of systems performed and notable only for as above All other review of systems were negative.  PHYSICAL EXAM:   Vitals:   03/21/21 0854  BP: 125/71  Pulse: 99  Weight: 278 lb (126.1 kg)  Height: 5\' 5"  (1.651 m)   Not recorded     Body mass index is 46.26 kg/m.  PHYSICAL EXAMNIATION:  Gen: NAD, conversant, well nourised, well groomed                     Cardiovascular: Regular  rate rhythm, no peripheral edema, warm, nontender. Eyes: Conjunctivae clear without exudates or hemorrhage Neck: Supple, no carotid bruits. Pulmonary: Clear to auscultation bilaterally   NEUROLOGICAL EXAM:  MENTAL STATUS: Speech:    Speech is normal; fluent and spontaneous with normal comprehension.  Cognition:     Orientation to time, place and person     Normal recent and remote memory     Normal Attention span and concentration     Normal Language, naming,  repeating,spontaneous speech     Fund of knowledge   CRANIAL NERVES: CN II: Visual fields are full to confrontation. Pupils are round equal and briskly reactive to light. CN III, IV, VI: extraocular movement are normal. No ptosis. CN V: Facial sensation is intact to light touch CN VII: Face is symmetric with normal eye closure  CN VIII: Hearing is normal to causal conversation. CN IX, X: Phonation is normal. CN XI: Head turning and shoulder shrug are intact  MOTOR: There is no pronator drift of out-stretched arms. Muscle bulk and tone are normal. Muscle strength is normal.  REFLEXES: Reflexes are 2+ and symmetric at the biceps, triceps, knees, and ankles. Plantar responses are flexor.  SENSORY: Intact to light touch, pinprick and vibratory sensation are intact in fingers and toes.  COORDINATION: There is no trunk or limb dysmetria noted.  GAIT/STANCE: She needs push-up to get up from seated position, mildly antalgic  ALLERGIES: Allergies  Allergen Reactions  . Amoxicillin-Pot Clavulanate Anaphylaxis  . Penicillins Anaphylaxis       . Adhesive [Tape] Other (See Comments)    TEARS SKIN  . Hydrocodone Itching    CAN TAKE WITH BENADRYL  . Meperidine Hives and Swelling  . Cefaclor Rash  . Codeine Rash  . Darvocet [Propoxyphene N-Acetaminophen] Rash  . Erythromycin Rash  . Flexeril [Cyclobenzaprine Hcl] Rash  . Percocet [Oxycodone-Acetaminophen] Itching and Rash  . Sulfa Antibiotics Rash    HOME MEDICATIONS: Current Outpatient Medications  Medication Sig Dispense Refill  . acetaminophen (TYLENOL) 325 MG tablet Take 650 mg by mouth every 6 (six) hours as needed for mild pain or moderate pain. Reported on 04/18/2016    . amLODipine (NORVASC) 5 MG tablet Take 1 tablet by mouth daily.    Marland Kitchen b complex vitamins tablet Take 1 tablet by mouth daily. Reported on 06/14/2016    . benzonatate (TESSALON) 100 MG capsule Take 1 capsule (100 mg total) by mouth 2 (two) times daily as  needed for cough. 20 capsule 0  . cetirizine (ZYRTEC) 10 MG tablet Take 10 mg by mouth daily as needed for allergies. Reported on 06/14/2016    . fluticasone (FLONASE) 50 MCG/ACT nasal spray Place 2 sprays into both nostrils daily. 16 g 6  . gabapentin (NEURONTIN) 300 MG capsule TAKE 1 CAPSULE AT BEDTIME TOGETHER WITH A 600 MG CAPSULE TO MAKE 900 MG AT BEDTIME(RF ON 4/7) 90 capsule 0  . gabapentin (NEURONTIN) 600 MG tablet TAKE 1 TABLET (600 MG TOTAL) BY MOUTH AT BEDTIME. 90 tablet 4  . Meloxicam 15 MG TBDP Take by mouth. 30 tablet   . Multiple Vitamins-Minerals (CENTRUM SILVER ADULT 50+ PO) Take 1 capsule by mouth daily. Reported on 03/14/2016    . tamoxifen (NOLVADEX) 20 MG tablet Take 1 tablet (20 mg total) by mouth daily. 90 tablet 4  . topiramate (TOPAMAX) 50 MG tablet Take 1 tablet by mouth at bedtime.    . traMADol (ULTRAM) 50 MG tablet Take 1 tablet by mouth as needed.  No current facility-administered medications for this visit.    PAST MEDICAL HISTORY: Past Medical History:  Diagnosis Date  . Anxiety   . Arthritis    knees  . Breast cancer ([place]) 12/2015   right breast  . Cancer ([place])   . Family history of breast cancer   . Family history of colon cancer   . Family history of thyroid cancer   . History of breast cancer 2017   right  . History of chemotherapy 2017  . History of trigeminal neuralgia   . Migraine   . Personal history of chemotherapy 2017  . Personal history of radiation therapy 2017  . PONV (postoperative nausea and vomiting)   . Wears partial dentures    upper    PAST SURGICAL HISTORY: Past Surgical History:  Procedure Laterality Date  . BREAST LUMPECTOMY Right 12/2015  . BREAST LUMPECTOMY WITH RADIOACTIVE SEED AND SENTINEL LYMPH NODE BIOPSY Right 12/29/2015   Procedure: RIGHT BREAST  RADIOACTIVE SEED LOCALIZATION LUMPECTOMY AND RIGHT SENTINEL LYMPH NODE MAPPING;  Surgeon: Erroll Luna, MD;  Location: [facility];  Service: General;   Laterality: Right;  . CYSTOSCOPY  06/28/2010  . KNEE ARTHROSCOPY Left 07/11/2011  . LESION EXCISION Bilateral 01/20/2014   upper and lower eyelids  . PORT-A-CATH REMOVAL Right 02/18/2017   Procedure: REMOVAL PORT-A-CATH;  Surgeon: Erroll Luna, MD;  Location: [facility];  Service: General;  Laterality: Right;  . PORTACATH PLACEMENT N/A 12/29/2015   Procedure: INSERTION PORT-A-CATH;  Surgeon: Erroll Luna, MD;  Location: [facility];  Service: General;  Laterality: N/A;  . TOTAL ABDOMINAL HYSTERECTOMY W/ BILATERAL SALPINGOOPHORECTOMY  06/28/2010    FAMILY HISTORY: Family History  Problem Relation Age of Onset  . Diabetes Mother   . Hypertension Mother   . Asthma Daughter   . Diabetes Sister   . Hypertension Sister   . Colon cancer Sister 75  . Thyroid cancer Sister 39  . Lung cancer Father   . Breast cancer Cousin        maternal first cousin  . Breast cancer Cousin 20       maternal first cousin - inflammatory    SOCIAL HISTORY: Social History   Socioeconomic History  . Marital status: Single    Spouse name: Not on file  . Number of children: 2  . Years of education: some college  . Highest education level: Not on file  Occupational History  . Occupation: Aeronautical engineer: [employer]  Tobacco Use  . Smoking status: Never Smoker  . Smokeless tobacco: Never Used  Substance and Sexual Activity  . Alcohol use: No  . Drug use: No  . Sexual activity: Never  Other Topics Concern  . Not on file  Social History Narrative   Patient is single.    Patient has 2 children.    Patient is right handed    Patient [employer] in [employer] office.   Her son lives with her but travels a lot.   No daily use of caffeine.       Social Determinants of Health   Financial Resource Strain: Not on file  Food Insecurity: Not on file  Transportation Needs: Not on file  Physical Activity: Not on file  Stress: Not on file  Social  Connections: Not on file  Intimate Partner Violence: Not on file      Marcial Pacas, M.D. Ph.D.  [facility] [address], [place],  [place] [address] Ph: [phone] Fax: [phone]  CC:  Rankins, Bill Salinas, MD [place],  [place] [address]  Aretta Nip, MD

## 2021-03-21 NOTE — Patient Instructions (Signed)
maxalt as needed,   May combine with zofran 4mg  as needed  Plus mobic if needed (aleve)

## 2021-03-23 ENCOUNTER — Other Ambulatory Visit: Payer: Self-pay

## 2021-03-23 ENCOUNTER — Ambulatory Visit
Admission: RE | Admit: 2021-03-23 | Discharge: 2021-03-23 | Disposition: A | Discharge status: Home / Self Care | Payer: BC Managed Care – PPO | Source: Ambulatory Visit | Attending: Oncology | Admitting: Oncology

## 2021-03-23 DIAGNOSIS — Z9889 Other specified postprocedural states: Secondary | ICD-10-CM

## 2021-03-23 DIAGNOSIS — R922 Inconclusive mammogram: Secondary | ICD-10-CM | POA: Diagnosis not present

## 2021-03-29 DIAGNOSIS — G4733 Obstructive sleep apnea (adult) (pediatric): Secondary | ICD-10-CM | POA: Diagnosis not present

## 2021-04-18 ENCOUNTER — Encounter: Payer: Self-pay | Admitting: Registered"

## 2021-04-18 ENCOUNTER — Encounter: Payer: BC Managed Care – PPO | Attending: Family Medicine | Admitting: Registered"

## 2021-04-18 ENCOUNTER — Other Ambulatory Visit: Payer: Self-pay

## 2021-04-18 DIAGNOSIS — E669 Obesity, unspecified: Secondary | ICD-10-CM | POA: Insufficient documentation

## 2021-04-18 NOTE — Patient Instructions (Signed)
-   Aim to eat about every 3-5 hours.   - Aim to increase fiber intake with whole grains, fruits, vegetables, nuts, and beans.   - Aim to increase movement to 15-20 min, 1-2 times/day.   - Aim to reduce eating out and increase having meals at home. See handout for ideas.

## 2021-04-18 NOTE — Progress Notes (Signed)
Medical Nutrition Therapy  Appointment Start time:  8:02  Appointment End time:  9:18  Primary concerns today: diet plan for helthy eating Referral diagnosis: obesity Preferred learning style: no preference indicated Learning readiness: ready   NUTRITION ASSESSMENT   Pt arrives stating her migraines returned and not has increased BP. Referral shows recent BP from doctors appt was 126/82. Pt states her BP has been normal lately but still taking BP medications temporarily.   States she does not like eggs, milk, and oatmeal. States she was told to stay away from red meats and processed foods as a result breast cancer (diagnosed 11/2015). States she eats chicken and likes cottage cheese. Will have cottage cheese and unsweetened applesauce as breakfast sometimes. Reports she loves bacon but does not eat it often. States she sometimes she may eat sub or ham. States she needs more fruits and vegetables in her life. States her taste for fruits are different now since breast cancer. Reports her last chemo treatment was 2018.   States work has been stressful lately. States her work schedule is Mon-Fri 8:30-5 pm but she usually works 7:30-5:30 pm. States she tries to shut work down while at home but sometimes will work at home mainly on special projects.   Pt expectations: breakfast ideas and meal ideas   Clinical Medical Hx: essential HTN, hyperglycemia, history of breast cancer Medications: See list Labs: elevated A1c (5.7) Notable Signs/Symptoms: migraines  Lifestyle & Dietary Hx  Estimated daily fluid intake: 80 oz Supplements: See list Sleep: 5-6 hrs Stress / self-care: listening to music, watch videos, reading, walking sometimes Current average weekly physical activity: not yet, has upcoming appointment with fitness director  24-Hr Dietary Recall First Meal: [employer]-sausage biscuit + lg sweet tea with lemon Snack:  Second Meal: [employer]-grilled chicken sandwich + kale salad +  lemonade Snack:  Third Meal: [employer]-8 nuggets + water Snack:  Beverages: sparkling water, sweet tea (32 oz), water (80 oz)   NUTRITION DIAGNOSIS  NB-1.1 Food and nutrition-related knowledge deficit As related to hypertension and prediabetes.  As evidenced by lack of previous nutrition-related education.   NUTRITION INTERVENTION  Nutrition education (E-1) on the following topics: Nutrition education and counseling. Pt was educated and counseled on hypertension, nutritional ways to lower blood pressure, ways to increase fiber, vitamin, and mineral intake, and ways to increase physical activity.  Pt was educated and counseled on prediabetes/diabetes risk factors, A1c ranges for prediabetes/diabetes, and role of fiber in eating regimen. Discussed how to balance meals using MyPlate for prediabetes. Pt agreed with goals listed.  Handouts Provided Include   Hypertension Nutrition Therapy  MyPlate for prediabetes  Learning Style & Readiness for Change Teaching method utilized: Visual & Auditory  Demonstrated degree of understanding via: Teach Back  Barriers to learning/adherence to lifestyle change: work-life balance  Goals Established by Pt  Aim to eat about every 3-5 hours.   Aim to increase fiber intake with whole grains, fruits, vegetables, nuts, and beans.   Aim to increase movement to 15-20 min, 1-2 times/day.   Aim to reduce eating out and increase having meals at home. See handout for ideas.   MONITORING & EVALUATION Dietary intake and weekly physical activity.  Next Steps  Patient is to follow-up prn.

## 2021-05-03 DIAGNOSIS — G4733 Obstructive sleep apnea (adult) (pediatric): Secondary | ICD-10-CM | POA: Diagnosis not present

## 2021-05-22 ENCOUNTER — Ambulatory Visit: Payer: BC Managed Care – PPO | Admitting: Neurology

## 2021-05-31 ENCOUNTER — Other Ambulatory Visit: Payer: Self-pay

## 2021-05-31 ENCOUNTER — Emergency Department (HOSPITAL_BASED_OUTPATIENT_CLINIC_OR_DEPARTMENT_OTHER): Payer: BC Managed Care – PPO

## 2021-05-31 ENCOUNTER — Emergency Department (HOSPITAL_BASED_OUTPATIENT_CLINIC_OR_DEPARTMENT_OTHER)
Admission: EM | Admit: 2021-05-31 | Discharge: 2021-05-31 | Disposition: A | Discharge status: Home / Self Care | Payer: BC Managed Care – PPO | Attending: Emergency Medicine | Admitting: Emergency Medicine

## 2021-05-31 ENCOUNTER — Encounter (HOSPITAL_BASED_OUTPATIENT_CLINIC_OR_DEPARTMENT_OTHER): Payer: Self-pay

## 2021-05-31 ENCOUNTER — Encounter: Payer: Self-pay | Admitting: Oncology

## 2021-05-31 DIAGNOSIS — M47816 Spondylosis without myelopathy or radiculopathy, lumbar region: Secondary | ICD-10-CM | POA: Diagnosis not present

## 2021-05-31 DIAGNOSIS — M19012 Primary osteoarthritis, left shoulder: Secondary | ICD-10-CM | POA: Diagnosis not present

## 2021-05-31 DIAGNOSIS — M62838 Other muscle spasm: Secondary | ICD-10-CM | POA: Diagnosis not present

## 2021-05-31 DIAGNOSIS — R519 Headache, unspecified: Secondary | ICD-10-CM | POA: Diagnosis not present

## 2021-05-31 DIAGNOSIS — Z853 Personal history of malignant neoplasm of breast: Secondary | ICD-10-CM | POA: Diagnosis not present

## 2021-05-31 DIAGNOSIS — S0990XA Unspecified injury of head, initial encounter: Secondary | ICD-10-CM | POA: Diagnosis not present

## 2021-05-31 DIAGNOSIS — M549 Dorsalgia, unspecified: Secondary | ICD-10-CM | POA: Diagnosis not present

## 2021-05-31 DIAGNOSIS — S4992XA Unspecified injury of left shoulder and upper arm, initial encounter: Secondary | ICD-10-CM | POA: Insufficient documentation

## 2021-05-31 DIAGNOSIS — M25519 Pain in unspecified shoulder: Secondary | ICD-10-CM | POA: Diagnosis not present

## 2021-05-31 DIAGNOSIS — Z79899 Other long term (current) drug therapy: Secondary | ICD-10-CM | POA: Diagnosis not present

## 2021-05-31 DIAGNOSIS — M2578 Osteophyte, vertebrae: Secondary | ICD-10-CM | POA: Diagnosis not present

## 2021-05-31 DIAGNOSIS — M62830 Muscle spasm of back: Secondary | ICD-10-CM | POA: Insufficient documentation

## 2021-05-31 DIAGNOSIS — S199XXA Unspecified injury of neck, initial encounter: Secondary | ICD-10-CM | POA: Insufficient documentation

## 2021-05-31 DIAGNOSIS — Z041 Encounter for examination and observation following transport accident: Secondary | ICD-10-CM | POA: Diagnosis not present

## 2021-05-31 DIAGNOSIS — R079 Chest pain, unspecified: Secondary | ICD-10-CM | POA: Diagnosis not present

## 2021-05-31 DIAGNOSIS — I10 Essential (primary) hypertension: Secondary | ICD-10-CM | POA: Diagnosis not present

## 2021-05-31 DIAGNOSIS — M542 Cervicalgia: Secondary | ICD-10-CM | POA: Diagnosis not present

## 2021-05-31 DIAGNOSIS — Y92410 Unspecified street and highway as the place of occurrence of the external cause: Secondary | ICD-10-CM | POA: Insufficient documentation

## 2021-05-31 DIAGNOSIS — S299XXA Unspecified injury of thorax, initial encounter: Secondary | ICD-10-CM | POA: Diagnosis not present

## 2021-05-31 MED ORDER — ORPHENADRINE CITRATE ER 100 MG PO TB12: 100.0000 mg | ORAL_TABLET | Freq: Two times a day (BID) | ORAL | 0 refills | Status: AC

## 2021-05-31 MED ORDER — LIDOCAINE 5 % EX PTCH
1.0000 | MEDICATED_PATCH | CUTANEOUS | 0 refills | Status: DC
Start: 2021-05-31 — End: 2021-06-20

## 2021-05-31 NOTE — ED Triage Notes (Signed)
C/o headache, L neck and back pain, L shoulder pain.

## 2021-05-31 NOTE — ED Notes (Signed)
ED Provider at bedside. 

## 2021-05-31 NOTE — ED Notes (Signed)
Patient transported to XR-CT 
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
## 2021-05-31 NOTE — ED Notes (Signed)
Pt. States feeling better at this time.

## 2021-05-31 NOTE — ED Notes (Signed)
Discharge instructions reviewed.

## 2021-05-31 NOTE — ED Notes (Signed)
Back from xray. In no distress.

## 2021-06-01 DIAGNOSIS — M25512 Pain in left shoulder: Secondary | ICD-10-CM | POA: Diagnosis not present

## 2021-06-04 DIAGNOSIS — G4733 Obstructive sleep apnea (adult) (pediatric): Secondary | ICD-10-CM | POA: Diagnosis not present

## 2021-06-04 DIAGNOSIS — I10 Essential (primary) hypertension: Secondary | ICD-10-CM | POA: Diagnosis not present

## 2021-06-04 DIAGNOSIS — R7303 Prediabetes: Secondary | ICD-10-CM | POA: Diagnosis not present

## 2021-06-05 DIAGNOSIS — G4733 Obstructive sleep apnea (adult) (pediatric): Secondary | ICD-10-CM | POA: Diagnosis not present

## 2021-06-13 DIAGNOSIS — M25512 Pain in left shoulder: Secondary | ICD-10-CM | POA: Diagnosis not present

## 2021-06-20 ENCOUNTER — Encounter: Payer: Self-pay | Admitting: Neurology

## 2021-06-20 ENCOUNTER — Other Ambulatory Visit: Payer: Self-pay

## 2021-06-20 ENCOUNTER — Ambulatory Visit: Payer: BC Managed Care – PPO | Admitting: Neurology

## 2021-06-20 VITALS — BP 125/83 | HR 103 | Ht 64.0 in | Wt 278.0 lb

## 2021-06-20 DIAGNOSIS — G43709 Chronic migraine without aura, not intractable, without status migrainosus: Secondary | ICD-10-CM

## 2021-06-20 NOTE — Patient Instructions (Signed)
Great to meet you today! Continue current medications Return in 8 months
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
## 2021-07-04 DIAGNOSIS — M25512 Pain in left shoulder: Secondary | ICD-10-CM | POA: Diagnosis not present

## 2021-07-11 DIAGNOSIS — M75102 Unspecified rotator cuff tear or rupture of left shoulder, not specified as traumatic: Secondary | ICD-10-CM | POA: Diagnosis not present

## 2021-08-10 DIAGNOSIS — I10 Essential (primary) hypertension: Secondary | ICD-10-CM | POA: Diagnosis not present

## 2021-08-10 DIAGNOSIS — R7303 Prediabetes: Secondary | ICD-10-CM | POA: Diagnosis not present

## 2021-08-10 DIAGNOSIS — G4733 Obstructive sleep apnea (adult) (pediatric): Secondary | ICD-10-CM | POA: Diagnosis not present

## 2021-08-17 DIAGNOSIS — Z4789 Encounter for other orthopedic aftercare: Secondary | ICD-10-CM | POA: Diagnosis not present

## 2021-08-17 DIAGNOSIS — M659 Synovitis and tenosynovitis, unspecified: Secondary | ICD-10-CM | POA: Diagnosis not present

## 2021-08-17 DIAGNOSIS — M24612 Ankylosis, left shoulder: Secondary | ICD-10-CM | POA: Diagnosis not present

## 2021-08-17 DIAGNOSIS — G8918 Other acute postprocedural pain: Secondary | ICD-10-CM | POA: Diagnosis not present

## 2021-08-17 DIAGNOSIS — M25512 Pain in left shoulder: Secondary | ICD-10-CM | POA: Diagnosis not present

## 2021-08-17 DIAGNOSIS — M65812 Other synovitis and tenosynovitis, left shoulder: Secondary | ICD-10-CM | POA: Diagnosis not present

## 2021-08-17 DIAGNOSIS — M75122 Complete rotator cuff tear or rupture of left shoulder, not specified as traumatic: Secondary | ICD-10-CM | POA: Diagnosis not present

## 2021-08-17 DIAGNOSIS — M7522 Bicipital tendinitis, left shoulder: Secondary | ICD-10-CM | POA: Diagnosis not present

## 2021-08-17 DIAGNOSIS — X58XXXA Exposure to other specified factors, initial encounter: Secondary | ICD-10-CM | POA: Diagnosis not present

## 2021-08-17 DIAGNOSIS — Y999 Unspecified external cause status: Secondary | ICD-10-CM | POA: Diagnosis not present

## 2021-08-17 DIAGNOSIS — M75102 Unspecified rotator cuff tear or rupture of left shoulder, not specified as traumatic: Secondary | ICD-10-CM | POA: Diagnosis not present

## 2021-08-17 DIAGNOSIS — I890 Lymphedema, not elsewhere classified: Secondary | ICD-10-CM | POA: Diagnosis not present

## 2021-08-17 DIAGNOSIS — S43432A Superior glenoid labrum lesion of left shoulder, initial encounter: Secondary | ICD-10-CM | POA: Diagnosis not present

## 2021-08-17 DIAGNOSIS — M7552 Bursitis of left shoulder: Secondary | ICD-10-CM | POA: Diagnosis not present

## 2021-08-27 DIAGNOSIS — M25512 Pain in left shoulder: Secondary | ICD-10-CM | POA: Diagnosis not present

## 2021-09-03 ENCOUNTER — Other Ambulatory Visit: Payer: Self-pay | Admitting: *Deleted

## 2021-09-03 DIAGNOSIS — C50411 Malignant neoplasm of upper-outer quadrant of right female breast: Secondary | ICD-10-CM

## 2021-09-03 DIAGNOSIS — Z170 Estrogen receptor positive status [ER+]: Secondary | ICD-10-CM

## 2021-09-04 ENCOUNTER — Inpatient Hospital Stay: Payer: BC Managed Care – PPO | Attending: Oncology

## 2021-09-04 ENCOUNTER — Inpatient Hospital Stay (HOSPITAL_BASED_OUTPATIENT_CLINIC_OR_DEPARTMENT_OTHER): Payer: BC Managed Care – PPO | Admitting: Oncology

## 2021-09-04 ENCOUNTER — Other Ambulatory Visit: Payer: Self-pay

## 2021-09-04 ENCOUNTER — Other Ambulatory Visit: Payer: Self-pay | Admitting: Oncology

## 2021-09-04 VITALS — BP 142/88 | HR 90 | Temp 98.1°F | Wt 267.6 lb

## 2021-09-04 DIAGNOSIS — C50411 Malignant neoplasm of upper-outer quadrant of right female breast: Secondary | ICD-10-CM

## 2021-09-04 DIAGNOSIS — Z79810 Long term (current) use of selective estrogen receptor modulators (SERMs): Secondary | ICD-10-CM | POA: Insufficient documentation

## 2021-09-04 DIAGNOSIS — Z79899 Other long term (current) drug therapy: Secondary | ICD-10-CM | POA: Insufficient documentation

## 2021-09-04 DIAGNOSIS — Z170 Estrogen receptor positive status [ER+]: Secondary | ICD-10-CM | POA: Diagnosis not present

## 2021-09-04 LAB — CMP (CANCER CENTER ONLY)
ALT: 11 U/L (ref 0–44)
AST: 15 U/L (ref 15–41)
Albumin: 3.6 g/dL (ref 3.5–5.0)
Alkaline Phosphatase: 88 U/L (ref 38–126)
Anion gap: 10 (ref 5–15)
BUN: 10 mg/dL (ref 6–20)
CO2: 24 mmol/L (ref 22–32)
Calcium: 9.2 mg/dL (ref 8.9–10.3)
Chloride: 111 mmol/L (ref 98–111)
Creatinine: 0.79 mg/dL (ref 0.44–1.00)
GFR, Estimated: >60 mL/min (ref >60)
Glucose, Bld: 100 mg/dL — ABNORMAL HIGH (ref 70–99)
Potassium: 3.3 mmol/L — ABNORMAL LOW (ref 3.5–5.1)
Sodium: 145 mmol/L (ref 135–145)
Total Bilirubin: 0.4 mg/dL (ref 0.3–1.2)
Total Protein: 7.6 g/dL (ref 6.5–8.1)

## 2021-09-04 LAB — CBC WITH DIFFERENTIAL (CANCER CENTER ONLY)
Abs Immature Granulocytes: 0 10*3/uL (ref 0.00–0.07)
Basophils Absolute: 0 10*3/uL (ref 0.0–0.1)
Basophils Relative: 1 %
Eosinophils Absolute: 0.1 10*3/uL (ref 0.0–0.5)
Eosinophils Relative: 3 %
HCT: 40.3 % (ref 36.0–46.0)
Hemoglobin: 13 g/dL (ref 12.0–15.0)
Immature Granulocytes: 0 %
Lymphocytes Relative: 51 %
Lymphs Abs: 2.3 10*3/uL (ref 0.7–4.0)
MCH: 27.8 pg (ref 26.0–34.0)
MCHC: 32.3 g/dL (ref 30.0–36.0)
MCV: 86.3 fL (ref 80.0–100.0)
Monocytes Absolute: 0.3 10*3/uL (ref 0.1–1.0)
Monocytes Relative: 6 %
Neutro Abs: 1.7 10*3/uL (ref 1.7–7.7)
Neutrophils Relative %: 39 %
Platelet Count: 257 10*3/uL (ref 150–400)
RBC: 4.67 MIL/uL (ref 3.87–5.11)
RDW: 13.2 % (ref 11.5–15.5)
WBC Count: 4.4 10*3/uL (ref 4.0–10.5)
nRBC: 0 % (ref 0.0–0.2)

## 2021-09-04 MED ORDER — GABAPENTIN 300 MG PO CAPS: 300.0000 mg | ORAL_CAPSULE | Freq: Three times a day (TID) | ORAL | 3 refills | Status: AC
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
## 2021-09-05 DIAGNOSIS — M25512 Pain in left shoulder: Secondary | ICD-10-CM | POA: Diagnosis not present

## 2021-09-12 DIAGNOSIS — M25512 Pain in left shoulder: Secondary | ICD-10-CM | POA: Diagnosis not present

## 2021-09-19 DIAGNOSIS — M25512 Pain in left shoulder: Secondary | ICD-10-CM | POA: Diagnosis not present

## 2021-09-25 DIAGNOSIS — M25512 Pain in left shoulder: Secondary | ICD-10-CM | POA: Diagnosis not present

## 2021-09-27 DIAGNOSIS — M25512 Pain in left shoulder: Secondary | ICD-10-CM | POA: Diagnosis not present

## 2021-10-02 DIAGNOSIS — M25512 Pain in left shoulder: Secondary | ICD-10-CM | POA: Diagnosis not present

## 2021-10-04 DIAGNOSIS — I10 Essential (primary) hypertension: Secondary | ICD-10-CM | POA: Diagnosis not present

## 2021-10-04 DIAGNOSIS — G4733 Obstructive sleep apnea (adult) (pediatric): Secondary | ICD-10-CM | POA: Diagnosis not present

## 2021-10-04 DIAGNOSIS — R7303 Prediabetes: Secondary | ICD-10-CM | POA: Diagnosis not present

## 2021-10-04 DIAGNOSIS — E876 Hypokalemia: Secondary | ICD-10-CM | POA: Diagnosis not present

## 2021-10-05 DIAGNOSIS — I10 Essential (primary) hypertension: Secondary | ICD-10-CM | POA: Diagnosis not present

## 2021-10-05 DIAGNOSIS — U071 COVID-19: Secondary | ICD-10-CM | POA: Diagnosis not present

## 2021-10-05 DIAGNOSIS — R051 Acute cough: Secondary | ICD-10-CM | POA: Diagnosis not present

## 2021-10-15 DIAGNOSIS — M25512 Pain in left shoulder: Secondary | ICD-10-CM | POA: Diagnosis not present

## 2022-01-22 ENCOUNTER — Other Ambulatory Visit: Payer: Self-pay | Admitting: Family Medicine

## 2022-01-22 DIAGNOSIS — Z1231 Encounter for screening mammogram for malignant neoplasm of breast: Secondary | ICD-10-CM

## 2022-01-22 DIAGNOSIS — Z853 Personal history of malignant neoplasm of breast: Secondary | ICD-10-CM

## 2022-02-06 ENCOUNTER — Encounter: Payer: Self-pay | Admitting: Oncology
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
## 2022-02-13 ENCOUNTER — Other Ambulatory Visit: Payer: Self-pay

## 2022-02-13 ENCOUNTER — Encounter: Payer: Self-pay | Admitting: Neurology

## 2022-02-13 ENCOUNTER — Encounter: Payer: Self-pay | Admitting: Oncology

## 2022-02-13 ENCOUNTER — Ambulatory Visit: Payer: 59 | Admitting: Neurology

## 2022-02-13 VITALS — BP 120/77 | HR 89 | Ht 64.0 in | Wt 264.0 lb

## 2022-02-13 DIAGNOSIS — G43709 Chronic migraine without aura, not intractable, without status migrainosus: Secondary | ICD-10-CM | POA: Diagnosis not present

## 2022-02-13 DIAGNOSIS — T451X5A Adverse effect of antineoplastic and immunosuppressive drugs, initial encounter: Secondary | ICD-10-CM

## 2022-02-13 DIAGNOSIS — G620 Drug-induced polyneuropathy: Secondary | ICD-10-CM

## 2022-02-13 MED ORDER — RIZATRIPTAN BENZOATE 5 MG PO TBDP: 5.0000 mg | ORAL_TABLET | ORAL | 11 refills | Status: DC | PRN

## 2022-02-13 MED ORDER — ONDANSETRON 4 MG PO TBDP: 4.0000 mg | ORAL_TABLET | Freq: Three times a day (TID) | ORAL | 6 refills | Status: AC | PRN

## 2022-02-13 MED ORDER — TOPIRAMATE 25 MG PO TABS: 25.0000 mg | ORAL_TABLET | Freq: Every day | ORAL | 0 refills | Status: DC

## 2022-02-13 NOTE — Patient Instructions (Addendum)
Cut back Topamax to 25 mg for several weeks, if headaches do not increase stop it, watch for increase with headaches, continue to use rescue medications as needed, see you back in 1 year  ?

## 2022-02-28 ENCOUNTER — Other Ambulatory Visit: Payer: Self-pay | Admitting: Neurology

## 2022-03-07 ENCOUNTER — Other Ambulatory Visit: Payer: Self-pay | Admitting: Neurology

## 2022-03-18 ENCOUNTER — Encounter: Payer: Self-pay | Admitting: Oncology

## 2022-03-25 ENCOUNTER — Encounter: Payer: Self-pay | Admitting: Oncology

## 2022-03-25 ENCOUNTER — Ambulatory Visit
Admission: RE | Admit: 2022-03-25 | Discharge: 2022-03-25 | Disposition: A | Discharge status: Home / Self Care | Payer: 59 | Source: Ambulatory Visit | Attending: Family Medicine | Admitting: Family Medicine

## 2022-03-25 DIAGNOSIS — Z1231 Encounter for screening mammogram for malignant neoplasm of breast: Secondary | ICD-10-CM

## 2022-03-25 DIAGNOSIS — Z853 Personal history of malignant neoplasm of breast: Secondary | ICD-10-CM

## 2022-03-26 ENCOUNTER — Other Ambulatory Visit: Payer: Self-pay | Admitting: Family Medicine

## 2022-03-26 DIAGNOSIS — R928 Other abnormal and inconclusive findings on diagnostic imaging of breast: Secondary | ICD-10-CM

## 2022-04-09 ENCOUNTER — Ambulatory Visit
Admission: RE | Admit: 2022-04-09 | Discharge: 2022-04-09 | Disposition: A | Discharge status: Home / Self Care | Payer: 59 | Source: Ambulatory Visit | Attending: Family Medicine | Admitting: Family Medicine

## 2022-04-09 ENCOUNTER — Ambulatory Visit: Admission: RE | Admit: 2022-04-09 | Payer: 59 | Source: Ambulatory Visit

## 2022-04-09 DIAGNOSIS — R928 Other abnormal and inconclusive findings on diagnostic imaging of breast: Secondary | ICD-10-CM

## 2022-04-11 ENCOUNTER — Other Ambulatory Visit: Payer: Self-pay | Admitting: Neurology

## 2022-04-11 NOTE — Telephone Encounter (Signed)
As per last office visit note "Cut back Topamax to 25 mg for several weeks, if headaches do not increase stop it, watch for increase with headaches, continue to use rescue medications as needed, see you back in 1 year." ? ?I spoke with the patient to inquire about her headaches. She did state she was having fewer headaches, but would like to continue on Topamax 25 mg. She feels like this dosage is working well for her. ?
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
## 2022-10-14 DIAGNOSIS — J029 Acute pharyngitis, unspecified: Secondary | ICD-10-CM | POA: Diagnosis not present

## 2022-10-14 DIAGNOSIS — R051 Acute cough: Secondary | ICD-10-CM | POA: Diagnosis not present

## 2022-10-14 DIAGNOSIS — J0140 Acute pansinusitis, unspecified: Secondary | ICD-10-CM | POA: Diagnosis not present

## 2022-11-08 DIAGNOSIS — G4733 Obstructive sleep apnea (adult) (pediatric): Secondary | ICD-10-CM | POA: Diagnosis not present

## 2022-11-13 DIAGNOSIS — H2513 Age-related nuclear cataract, bilateral: Secondary | ICD-10-CM | POA: Diagnosis not present

## 2022-11-13 DIAGNOSIS — H47211 Primary optic atrophy, right eye: Secondary | ICD-10-CM | POA: Diagnosis not present

## 2022-12-09 DIAGNOSIS — G4733 Obstructive sleep apnea (adult) (pediatric): Secondary | ICD-10-CM | POA: Diagnosis not present

## 2023-01-02 DIAGNOSIS — I10 Essential (primary) hypertension: Secondary | ICD-10-CM | POA: Diagnosis not present

## 2023-01-02 DIAGNOSIS — G4733 Obstructive sleep apnea (adult) (pediatric): Secondary | ICD-10-CM | POA: Diagnosis not present

## 2023-01-09 DIAGNOSIS — G4733 Obstructive sleep apnea (adult) (pediatric): Secondary | ICD-10-CM | POA: Diagnosis not present

## 2023-01-20 DIAGNOSIS — M549 Dorsalgia, unspecified: Secondary | ICD-10-CM | POA: Diagnosis not present

## 2023-01-24 DIAGNOSIS — G620 Drug-induced polyneuropathy: Secondary | ICD-10-CM | POA: Diagnosis not present

## 2023-01-24 DIAGNOSIS — Z6841 Body Mass Index (BMI) 40.0 and over, adult: Secondary | ICD-10-CM | POA: Diagnosis not present

## 2023-01-24 DIAGNOSIS — Z13220 Encounter for screening for lipoid disorders: Secondary | ICD-10-CM | POA: Diagnosis not present

## 2023-01-24 DIAGNOSIS — G43909 Migraine, unspecified, not intractable, without status migrainosus: Secondary | ICD-10-CM | POA: Diagnosis not present

## 2023-01-24 DIAGNOSIS — Z23 Encounter for immunization: Secondary | ICD-10-CM | POA: Diagnosis not present

## 2023-01-24 DIAGNOSIS — G4733 Obstructive sleep apnea (adult) (pediatric): Secondary | ICD-10-CM | POA: Diagnosis not present

## 2023-01-24 DIAGNOSIS — Z0000 Encounter for general adult medical examination without abnormal findings: Secondary | ICD-10-CM | POA: Diagnosis not present

## 2023-01-24 DIAGNOSIS — Z853 Personal history of malignant neoplasm of breast: Secondary | ICD-10-CM | POA: Diagnosis not present

## 2023-01-27 DIAGNOSIS — Z01419 Encounter for gynecological examination (general) (routine) without abnormal findings: Secondary | ICD-10-CM | POA: Diagnosis not present

## 2023-01-29 ENCOUNTER — Other Ambulatory Visit (HOSPITAL_BASED_OUTPATIENT_CLINIC_OR_DEPARTMENT_OTHER): Payer: Self-pay | Admitting: Family Medicine

## 2023-01-29 DIAGNOSIS — E7800 Pure hypercholesterolemia, unspecified: Secondary | ICD-10-CM

## 2023-02-13 DIAGNOSIS — G4733 Obstructive sleep apnea (adult) (pediatric): Secondary | ICD-10-CM | POA: Diagnosis not present

## 2023-02-18 ENCOUNTER — Encounter: Payer: Self-pay | Admitting: Oncology

## 2023-02-19 ENCOUNTER — Encounter: Payer: Self-pay | Admitting: Neurology

## 2023-02-19 ENCOUNTER — Ambulatory Visit: Payer: BC Managed Care – PPO | Admitting: Neurology

## 2023-02-19 ENCOUNTER — Encounter: Payer: Self-pay | Admitting: Oncology

## 2023-02-19 VITALS — BP 142/86 | HR 80 | Ht 64.0 in | Wt 267.0 lb

## 2023-02-19 DIAGNOSIS — G43709 Chronic migraine without aura, not intractable, without status migrainosus: Secondary | ICD-10-CM

## 2023-02-19 MED ORDER — RIZATRIPTAN BENZOATE 5 MG PO TBDP: 5.0000 mg | ORAL_TABLET | ORAL | 11 refills | Status: DC | PRN

## 2023-02-19 MED ORDER — TOPIRAMATE 25 MG PO TABS: ORAL_TABLET | ORAL | 3 refills | Status: AC
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
## 2023-02-19 NOTE — Patient Instructions (Signed)
Restart Topamax 25 mg at bedtime nightly  Continue Maxalt as needed See you back in 1 year

## 2023-02-20 ENCOUNTER — Encounter: Payer: Self-pay | Admitting: Oncology

## 2023-02-21 ENCOUNTER — Encounter: Payer: Self-pay | Admitting: Oncology

## 2023-02-22 DIAGNOSIS — G43909 Migraine, unspecified, not intractable, without status migrainosus: Secondary | ICD-10-CM | POA: Diagnosis not present

## 2023-02-22 DIAGNOSIS — H6991 Unspecified Eustachian tube disorder, right ear: Secondary | ICD-10-CM | POA: Diagnosis not present

## 2023-02-25 ENCOUNTER — Ambulatory Visit: Payer: Self-pay | Admitting: Neurology

## 2023-02-25 ENCOUNTER — Telehealth: Payer: Self-pay | Admitting: Neurology

## 2023-02-25 NOTE — Telephone Encounter (Signed)
Pt called stating that she was informed by the pharmacy that they are needing a PA for her  rizatriptan (MAXALT-MLT) 5 MG disintegrating tablet  Please advise.

## 2023-02-28 ENCOUNTER — Ambulatory Visit (HOSPITAL_BASED_OUTPATIENT_CLINIC_OR_DEPARTMENT_OTHER)
Admission: RE | Admit: 2023-02-28 | Discharge: 2023-02-28 | Disposition: A | Discharge status: Home / Self Care | Payer: 59 | Source: Ambulatory Visit | Attending: Family Medicine | Admitting: Family Medicine

## 2023-02-28 DIAGNOSIS — E7800 Pure hypercholesterolemia, unspecified: Secondary | ICD-10-CM | POA: Insufficient documentation

## 2023-03-03 ENCOUNTER — Other Ambulatory Visit (HOSPITAL_COMMUNITY): Payer: Self-pay

## 2023-03-03 ENCOUNTER — Encounter: Payer: Self-pay | Admitting: Oncology

## 2023-03-03 NOTE — Telephone Encounter (Signed)
Pharmacy Patient Advocate Encounter   Received notification from [place] that prior authorization for Rizatriptan Benzoate 5MG  dispersible tablets is required/requested.   PA submitted on 03/03/2023 to (ins) [employer] [place] via [employer] or [facility]) confirmation # [id number] Status is pending

## 2023-03-05 ENCOUNTER — Other Ambulatory Visit: Payer: Self-pay | Admitting: Neurology

## 2023-03-06 ENCOUNTER — Telehealth: Payer: Self-pay

## 2023-03-06 NOTE — Telephone Encounter (Signed)
Pharmacy Patient Advocate Encounter  Received notification from [facility] that the request for prior authorization for Rizatriptan Benzoate 5MG  dispersible tablets has been denied due to See below.         Please be advised we currently do not have a Pharmacist to review denials, therefore you will need to process appeals accordingly as needed. Thanks for your support at this time.  The denial letter has been placed in chart under the media tab.

## 2023-03-06 NOTE — Telephone Encounter (Signed)
PA needed for Maxalt

## 2023-03-08 DIAGNOSIS — R509 Fever, unspecified: Secondary | ICD-10-CM | POA: Diagnosis not present

## 2023-03-08 DIAGNOSIS — R52 Pain, unspecified: Secondary | ICD-10-CM | POA: Diagnosis not present

## 2023-03-08 DIAGNOSIS — Z03818 Encounter for observation for suspected exposure to other biological agents ruled out: Secondary | ICD-10-CM | POA: Diagnosis not present

## 2023-03-08 DIAGNOSIS — R051 Acute cough: Secondary | ICD-10-CM | POA: Diagnosis not present

## 2023-03-08 DIAGNOSIS — J039 Acute tonsillitis, unspecified: Secondary | ICD-10-CM | POA: Diagnosis not present

## 2023-03-10 ENCOUNTER — Other Ambulatory Visit (HOSPITAL_COMMUNITY): Payer: Self-pay

## 2023-03-10 ENCOUNTER — Encounter: Payer: Self-pay | Admitting: Oncology

## 2023-03-10 NOTE — Telephone Encounter (Signed)
Called and left a detailed message per dpr

## 2023-03-10 NOTE — Telephone Encounter (Signed)
Pharmacy Patient Advocate Encounter   Received notification from [place] that prior authorization for Maxalt-MLT 5MG  Dispersible tablets is required/requested.   PA submitted on 03/10/2023 to (ins) [place] [employer]  via CoverMyMeds [employer] or (Medicaid) confirmation # [id number] Status is pending

## 2023-03-14 ENCOUNTER — Encounter: Payer: Self-pay | Admitting: Oncology

## 2023-03-14 ENCOUNTER — Other Ambulatory Visit (HOSPITAL_COMMUNITY): Payer: Self-pay

## 2023-03-14 NOTE — Telephone Encounter (Addendum)
    Is this for Maxalt Brand 5mg  or 10mg  because in CMM there is no option for a Maxalt 5mg  Tablet-I could not find documentation I chart for anything other than the 5mg  ODT -Please advise.

## 2023-03-27 ENCOUNTER — Other Ambulatory Visit (HOSPITAL_COMMUNITY): Payer: Self-pay

## 2023-03-27 ENCOUNTER — Encounter: Payer: Self-pay | Admitting: Oncology

## 2023-03-27 NOTE — Telephone Encounter (Signed)
Pharmacy Patient Advocate Encounter   Received notification from [facility] that prior authorization for Maxalt  tablets is required/requested.   PA submitted on 03/27/2023 to (ins) [employer] via Cover[employer]Meds [employer] or (Medicaid) confirmation # [id number] Status is pending  I will try for the  Tablets and see if we can get an approval.

## 2023-03-31 NOTE — Telephone Encounter (Signed)
    This was denied as well-no denial letter received from insurance yet-however there is an option to submit an Eappeal via the CMM portal using the key.

## 2023-04-01 NOTE — Telephone Encounter (Signed)
Resubmitted Vanessa [name] Key: [id number]

## 2023-04-01 NOTE — Telephone Encounter (Signed)
Mine was also denied. And the denial letter will have the details which is not showing online. I will need to see denial letter to know the specifics.

## 2023-04-30 DIAGNOSIS — M25562 Pain in left knee: Secondary | ICD-10-CM | POA: Diagnosis not present

## 2023-04-30 DIAGNOSIS — M1711 Unilateral primary osteoarthritis, right knee: Secondary | ICD-10-CM | POA: Diagnosis not present

## 2023-04-30 DIAGNOSIS — M25561 Pain in right knee: Secondary | ICD-10-CM | POA: Diagnosis not present

## 2023-05-13 DIAGNOSIS — H2513 Age-related nuclear cataract, bilateral: Secondary | ICD-10-CM | POA: Diagnosis not present

## 2023-05-13 DIAGNOSIS — H47211 Primary optic atrophy, right eye: Secondary | ICD-10-CM | POA: Diagnosis not present

## 2023-05-30 DIAGNOSIS — U071 COVID-19: Secondary | ICD-10-CM | POA: Diagnosis not present

## 2023-05-30 DIAGNOSIS — R051 Acute cough: Secondary | ICD-10-CM | POA: Diagnosis not present

## 2023-05-30 DIAGNOSIS — R509 Fever, unspecified: Secondary | ICD-10-CM | POA: Diagnosis not present

## 2023-05-30 DIAGNOSIS — H66003 Acute suppurative otitis media without spontaneous rupture of ear drum, bilateral: Secondary | ICD-10-CM | POA: Diagnosis not present

## 2023-06-09 DIAGNOSIS — M170 Bilateral primary osteoarthritis of knee: Secondary | ICD-10-CM | POA: Diagnosis not present

## 2023-07-25 DIAGNOSIS — M170 Bilateral primary osteoarthritis of knee: Secondary | ICD-10-CM | POA: Diagnosis not present

## 2023-08-01 DIAGNOSIS — M170 Bilateral primary osteoarthritis of knee: Secondary | ICD-10-CM | POA: Diagnosis not present

## 2023-08-08 DIAGNOSIS — M170 Bilateral primary osteoarthritis of knee: Secondary | ICD-10-CM | POA: Diagnosis not present

## 2023-10-29 ENCOUNTER — Other Ambulatory Visit: Payer: Self-pay | Admitting: Nurse Practitioner

## 2023-10-29 DIAGNOSIS — N644 Mastodynia: Secondary | ICD-10-CM

## 2023-11-20 ENCOUNTER — Ambulatory Visit
Admission: RE | Admit: 2023-11-20 | Discharge: 2023-11-20 | Disposition: A | Discharge status: Home / Self Care | Payer: Self-pay | Source: Ambulatory Visit | Attending: Nurse Practitioner | Admitting: Nurse Practitioner

## 2023-11-20 ENCOUNTER — Encounter: Payer: Self-pay | Admitting: Oncology

## 2023-11-20 ENCOUNTER — Ambulatory Visit
Admission: RE | Admit: 2023-11-20 | Discharge: 2023-11-20 | Disposition: A | Discharge status: Home / Self Care | Payer: 59 | Source: Ambulatory Visit | Attending: Nurse Practitioner | Admitting: Nurse Practitioner

## 2023-11-20 DIAGNOSIS — N644 Mastodynia: Secondary | ICD-10-CM

## 2023-12-03 ENCOUNTER — Other Ambulatory Visit: Payer: Self-pay

## 2023-12-03 ENCOUNTER — Emergency Department (HOSPITAL_COMMUNITY): Payer: 59

## 2023-12-03 ENCOUNTER — Emergency Department (HOSPITAL_COMMUNITY)
Admission: EM | Admit: 2023-12-03 | Discharge: 2023-12-03 | Disposition: A | Discharge status: Home / Self Care | Payer: 59 | Attending: Student | Admitting: Student

## 2023-12-03 DIAGNOSIS — R1031 Right lower quadrant pain: Secondary | ICD-10-CM

## 2023-12-03 DIAGNOSIS — N201 Calculus of ureter: Secondary | ICD-10-CM

## 2023-12-03 DIAGNOSIS — I10 Essential (primary) hypertension: Secondary | ICD-10-CM | POA: Insufficient documentation

## 2023-12-03 DIAGNOSIS — N132 Hydronephrosis with renal and ureteral calculous obstruction: Secondary | ICD-10-CM | POA: Insufficient documentation

## 2023-12-03 DIAGNOSIS — Z79899 Other long term (current) drug therapy: Secondary | ICD-10-CM | POA: Diagnosis not present

## 2023-12-03 DIAGNOSIS — R109 Unspecified abdominal pain: Secondary | ICD-10-CM

## 2023-12-03 DIAGNOSIS — Z853 Personal history of malignant neoplasm of breast: Secondary | ICD-10-CM | POA: Insufficient documentation

## 2023-12-03 LAB — CBC
HCT: 45.9 % (ref 36.0–46.0)
Hemoglobin: 14.5 g/dL (ref 12.0–15.0)
MCH: 27.5 pg (ref 26.0–34.0)
MCHC: 31.6 g/dL (ref 30.0–36.0)
MCV: 87.1 fL (ref 80.0–100.0)
Platelets: 277 10*3/uL (ref 150–400)
RBC: 5.27 MIL/uL — ABNORMAL HIGH (ref 3.87–5.11)
RDW: 13.5 % (ref 11.5–15.5)
WBC: 8.7 10*3/uL (ref 4.0–10.5)
nRBC: 0 % (ref 0.0–0.2)

## 2023-12-03 LAB — URINALYSIS, ROUTINE W REFLEX MICROSCOPIC
Bilirubin Urine: NEGATIVE
Glucose, UA: NEGATIVE mg/dL
Ketones, ur: NEGATIVE mg/dL
Leukocytes,Ua: NEGATIVE
Nitrite: NEGATIVE
Protein, ur: NEGATIVE mg/dL
Specific Gravity, Urine: 1.016 (ref 1.005–1.030)
pH: 5 (ref 5.0–8.0)

## 2023-12-03 LAB — COMPREHENSIVE METABOLIC PANEL
ALT: 11 U/L (ref 0–44)
AST: 15 U/L (ref 15–41)
Albumin: 3.5 g/dL (ref 3.5–5.0)
Alkaline Phosphatase: 92 U/L (ref 38–126)
Anion gap: 11 (ref 5–15)
BUN: 17 mg/dL (ref 8–23)
CO2: 26 mmol/L (ref 22–32)
Calcium: 9 mg/dL (ref 8.9–10.3)
Chloride: 101 mmol/L (ref 98–111)
Creatinine, Ser: 0.88 mg/dL (ref 0.44–1.00)
GFR, Estimated: >60 mL/min (ref >60)
Glucose, Bld: 143 mg/dL — ABNORMAL HIGH (ref 70–99)
Potassium: 3.3 mmol/L — ABNORMAL LOW (ref 3.5–5.1)
Sodium: 138 mmol/L (ref 135–145)
Total Bilirubin: 0.6 mg/dL (ref <1.2)
Total Protein: 7.1 g/dL (ref 6.5–8.1)

## 2023-12-03 LAB — LIPASE, BLOOD: Lipase: 26 U/L (ref 11–51)

## 2023-12-03 MED ORDER — KETOROLAC TROMETHAMINE 10 MG PO TABS: 10.0000 mg | ORAL_TABLET | Freq: Four times a day (QID) | ORAL | 0 refills | Status: AC | PRN

## 2023-12-03 MED ORDER — ONDANSETRON HCL 4 MG/2ML IJ SOLN
4.0000 mg | Freq: Once | INTRAMUSCULAR | Status: DC
  Filled 2023-12-03: qty 2

## 2023-12-03 MED ORDER — METHOCARBAMOL 500 MG PO TABS
500.0000 mg | ORAL_TABLET | Freq: Once | ORAL | Status: AC
  Administered 2023-12-03: 500 mg via ORAL
  Filled 2023-12-03: qty 1

## 2023-12-03 MED ORDER — KETOROLAC TROMETHAMINE 10 MG PO TABS
10.0000 mg | ORAL_TABLET | Freq: Once | ORAL | Status: AC
  Administered 2023-12-03: 10 mg via ORAL
  Filled 2023-12-03: qty 1

## 2023-12-03 MED ORDER — KETOROLAC TROMETHAMINE 15 MG/ML IJ SOLN
15.0000 mg | Freq: Once | INTRAMUSCULAR | Status: DC
  Filled 2023-12-03: qty 1

## 2023-12-03 MED ORDER — ONDANSETRON 4 MG PO TBDP
4.0000 mg | ORAL_TABLET | Freq: Once | ORAL | Status: AC
  Administered 2023-12-03: 4 mg via ORAL
  Filled 2023-12-03: qty 1

## 2023-12-03 MED ORDER — TAMSULOSIN HCL 0.4 MG PO CAPS: 0.4000 mg | ORAL_CAPSULE | Freq: Every day | ORAL | 0 refills | Status: AC

## 2023-12-03 NOTE — ED Triage Notes (Signed)
Patient reports RLQ abdominal pain and right low back pain with emesis onset 6 pm last night , no diarrhea or fever .

## 2023-12-03 NOTE — ED Provider Notes (Signed)
[place] EMERGENCY DEPARTMENT AT [facility] Provider Note   CSN: [id number] Arrival date & time: 12/03/23  0140     History  Chief Complaint  Patient presents with   Abdominal Pain    AUTUMNROSE STEMBRIDGE is a 62 y.o. female with history of breast cancer S/P chemotherapy and radiation, hypertension, trigeminal neuralgia, migraines, who presents the emergency department complaining of abdominal pain.  Patient states pain came on somewhat abruptly around 1800 yesterday evening.  It is worse in her right lower quadrant, radiating to her right flank.  She also feels she is having some muscle spasms in her right flank.  Associated nausea and vomiting.  Did have 1 looser bowel movement earlier, but was not watery or painful.  No blood in urine or stool.  Does report some "pressure" with urinating, but no burning.  Reports history of total abdominal hysterectomy, no other abdominal surgeries reported.  She did try taking her home tramadol which she uses for chronic knee pain, as well as 2 doses of Gas-X, neither provided relief for her abdominal pain.  She did not take her other home medications yesterday evening because of her nausea.   Abdominal Pain Associated symptoms: nausea and vomiting   Associated symptoms: no hematuria, no vaginal bleeding and no vaginal discharge        Home Medications Prior to Admission medications   Medication Sig Start Date End Date Taking? Authorizing Provider  ketorolac (TORADOL) 10 MG tablet Take 1 tablet (10 mg total) by mouth every 6 (six) hours as needed. 12/03/23  Yes Freddy Spadafora T, PA-C  tamsulosin (FLOMAX) 0.4 MG CAPS capsule Take 1 capsule (0.4 mg total) by mouth daily. 12/03/23  Yes Daren Doswell T, PA-C  acetaminophen (TYLENOL) 325 MG tablet Take 650 mg by mouth every 6 (six) hours as needed for mild pain or moderate pain. Reported on 04/18/2016    [provider]  b complex vitamins tablet Take 1 tablet by mouth daily.  Reported on 06/14/2016    [provider]  benzonatate (TESSALON) 100 MG capsule Take 1 capsule (100 mg total) by mouth 2 (two) times daily as needed for cough. 12/13/20   Mayers, Cari S, PA-C  cetirizine (ZYRTEC) 10 MG tablet Take 10 mg by mouth daily as needed for allergies. Reported on 06/14/2016    [provider]  diclofenac Sodium (VOLTAREN) 1 % GEL 1 application transdermal daily    [provider]  fluticasone (FLONASE) 50 MCG/ACT nasal spray Place 2 sprays into both nostrils daily. 12/13/20   Mayers, Cari S, PA-C  gabapentin (NEURONTIN) 300 MG capsule Take 1 capsule (300 mg total) by mouth 3 (three) times daily. 09/04/21   Magrinat, Valentino Hue, MD  gabapentin (NEURONTIN) 600 MG tablet TAKE 1 TABLET (600 MG TOTAL) BY MOUTH AT BEDTIME. 09/04/21   Magrinat, Valentino Hue, MD  Meloxicam 15 MG TBDP Take by mouth. 09/06/20   Magrinat, Valentino Hue, MD  methocarbamol (ROBAXIN) 500 MG tablet 1 tablet Orally every 12 hours as needed 01/20/23   [provider]  Multiple Vitamins-Minerals (CENTRUM SILVER ADULT 50+ PO) Take 1 capsule by mouth daily. Reported on 03/14/2016    [provider]  ondansetron (ZOFRAN ODT) 4 MG disintegrating tablet Take 1 tablet (4 mg total) by mouth every 8 (eight) hours as needed. 02/13/22   Glean Salvo, NP  orphenadrine (NORFLEX) 100 MG tablet Take 1 tablet (100 mg total) by mouth 2 (two) times daily. 05/31/21   Tegeler,  Canary Brim, MD  rizatriptan (MAXALT-MLT) 5 MG disintegrating tablet TAKE 1 TABLET (5 MG TOTAL) BY MOUTH AS NEEDED. MAY REPEAT IN 2 HOURS IF NEEDED 03/06/23   Glean Salvo, NP  topiramate (TOPAMAX) 25 MG tablet TAKE 1 TABLET BY MOUTH EVERYDAY AT BEDTIME 02/19/23   Glean Salvo, NP  traMADol (ULTRAM) 50 MG tablet Take 1 tablet by mouth as needed. 10/01/17   [provider]      Allergies    Amoxicillin-pot clavulanate, Penicillins, Adhesive [tape], Hydrocodone, Meperidine, Cefaclor, Codeine, Darvocet [propoxyphene  n-acetaminophen], Erythromycin, Flexeril [cyclobenzaprine hcl], Percocet [oxycodone-acetaminophen], and Sulfa antibiotics    Review of Systems   Review of Systems  Gastrointestinal:  Positive for abdominal pain, nausea and vomiting. Negative for blood in stool.  Genitourinary:  Positive for flank pain. Negative for hematuria, vaginal bleeding and vaginal discharge.  All other systems reviewed and are negative.   Physical Exam Updated Vital Signs BP 135/67   Pulse 90   Temp 97.6 F (36.4 C)   Resp 18   SpO2 97%  Physical Exam Vitals and nursing note reviewed.  Constitutional:      Appearance: Normal appearance.  HENT:     Head: Normocephalic and atraumatic.  Eyes:     Conjunctiva/sclera: Conjunctivae normal.  Cardiovascular:     Rate and Rhythm: Normal rate and regular rhythm.  Pulmonary:     Effort: Pulmonary effort is normal. No respiratory distress.     Breath sounds: Normal breath sounds.  Abdominal:     General: There is no distension.     Palpations: Abdomen is soft.     Tenderness: There is abdominal tenderness in the right lower quadrant. There is no guarding or rebound.  Skin:    General: Skin is warm and dry.  Neurological:     General: No focal deficit present.     Mental Status: She is alert.     ED Results / Procedures / Treatments   Labs (all labs ordered are listed, but only abnormal results are displayed) Labs Reviewed  COMPREHENSIVE METABOLIC PANEL - Abnormal; Notable for the following components:      Result Value   Potassium 3.3 (*)    Glucose, Bld 143 (*)    All other components within normal limits  CBC - Abnormal; Notable for the following components:   RBC 5.27 (*)    All other components within normal limits  URINALYSIS, ROUTINE W REFLEX MICROSCOPIC - Abnormal; Notable for the following components:   APPearance HAZY (*)    Hgb urine dipstick SMALL (*)    Bacteria, UA FEW (*)    All other components within normal limits  LIPASE, BLOOD     EKG None  Radiology CT Renal Stone Study Result Date: 12/03/2023 CLINICAL DATA:  Right lower quadrant pain. EXAM: CT ABDOMEN AND PELVIS WITHOUT CONTRAST TECHNIQUE: Multidetector CT imaging of the abdomen and pelvis was performed following the standard protocol without IV contrast. RADIATION DOSE REDUCTION: This exam was performed according to the departmental dose-optimization program which includes automated exposure control, adjustment of the mA and/or kV according to patient size and/or use of iterative reconstruction technique. COMPARISON:  None Available. FINDINGS: Lower chest: No acute abnormality. Hepatobiliary: No focal liver abnormality is seen. Subcentimeter gallstones are seen within the lumen of an otherwise normal-appearing gallbladder. Pancreas: Unremarkable. No pancreatic ductal dilatation or surrounding inflammatory changes. Spleen: A punctate calcified granuloma is seen within an otherwise normal-appearing spleen. Adrenals/Urinary Tract: Adrenal glands are unremarkable. Kidneys are normal  in size. A 1.7 cm diameter cyst is seen within the lateral aspect of the mid left kidney. A 3 mm obstructing renal calculus is seen within the distal right ureter, at the right UVJ, with mild to moderate severity right-sided hydronephrosis, hydroureter and perinephric inflammatory fat stranding. The urinary bladder is poorly distended and subsequently limited in evaluation. Stomach/Bowel: There is a small hiatal hernia. Appendix appears normal. No evidence of bowel wall thickening, distention, or inflammatory changes. Numerous noninflamed diverticula are seen scattered throughout the large bowel. Vascular/Lymphatic: Aortic atherosclerosis. No enlarged abdominal or pelvic lymph nodes. Reproductive: Status post hysterectomy. No adnexal masses. Other: No abdominal wall hernia or abnormality. No abdominopelvic ascites. Musculoskeletal: Moderate to marked severity degenerative changes are seen at the level  of L5-S1. IMPRESSION: 1. 3 mm obstructing renal calculus within the distal right ureter, at the right UVJ. 2. Cholelithiasis. 3. Colonic diverticulosis. 4. Small hiatal hernia. 5. Aortic atherosclerosis. Aortic Atherosclerosis (ICD10-I70.0). Electronically Signed   By: Aram Candela M.D.   On: 12/03/2023 02:36    Procedures Procedures    Medications Ordered in ED Medications  ketorolac (TORADOL) tablet 10 mg (has no administration in time range)  ondansetron (ZOFRAN-ODT) disintegrating tablet 4 mg (has no administration in time range)  methocarbamol (ROBAXIN) tablet 500 mg (has no administration in time range)    ED Course/ Medical Decision Making/ A&P                                 Medical Decision Making Amount and/or Complexity of Data Reviewed Labs: ordered. Radiology: ordered.  Risk Prescription drug management.  This patient is a 62 y.o. female  who presents to the ED for concern of RLQ abdominal pain.   Differential diagnoses prior to evaluation: The emergent differential diagnosis includes, but is not limited to,  AAA, mesenteric ischemia, appendicitis, diverticulitis, DKA, gastroenteritis, nephrolithiasis, pancreatitis, constipation, UTI, bowel obstruction, biliary disease, IBD, PUD, hepatitis, PID. This is not an exhaustive differential.   Past Medical History / Co-morbidities / Social History: breast cancer S/P chemotherapy and radiation, hypertension, trigeminal neuralgia, migraines  Additional history: Chart reviewed. Pertinent results include: hx total abdominal hysterectomy with bilateral salpingoophorectomy.   Pt with allergies to hydrocodone and oxycodone.   Physical Exam: Physical exam performed. The pertinent findings include: Hypertensive, otherwise normal vital signs.  No acute distress.  Right lower quadrant tenderness to palpation without guarding or rebound.  No CVA tenderness.  Lab Tests/Imaging studies: I personally interpreted labs/imaging  and the pertinent results include: CBC normal. CMP unremarkable. Normal lipase. UA with small hemoglobin and calcium oxalate crystals, negative for UTI.  CT renal stone study with 3 mm obstructing renal calculus within distal right ureter at the right UVJ. I agree with the radiologist interpretation.  Medications: I ordered medication including toradol and zofran, and muscle relaxer at patient request.  I have reviewed the patients home medicines and have made adjustments as needed.   Disposition: After consideration of the diagnostic results and the patients response to treatment, I feel that emergency department workup does not suggest an emergent condition requiring admission or immediate intervention beyond what has been performed at this time. The plan is: discharge to home with symptomatic management of kidney stone. UA negative for concomitant UTI. Will prescribe toradol and flomax and recommend urology follow up if symptoms persist. The patient is safe for discharge and has been instructed to return immediately for worsening symptoms, change in  symptoms or any other concerns.  Final Clinical Impression(s) / ED Diagnoses Final diagnoses:  Ureterolithiasis  RLQ abdominal pain  Right flank pain    Rx / DC Orders ED Discharge Orders          Ordered    ketorolac (TORADOL) 10 MG tablet  Every 6 hours PRN        12/03/23 0301    tamsulosin (FLOMAX) 0.4 MG CAPS capsule  Daily        12/03/23 0301           Portions of this report may have been transcribed using voice recognition software. Every effort was made to ensure accuracy; however, inadvertent computerized transcription errors may be present.    Jeanella Flattery 12/03/23 0303    Glendora Score, MD 12/03/23 [phone]

## 2023-12-03 NOTE — Discharge Instructions (Addendum)
You were seen in the emergency department today for abdominal and flank pain.  As we discussed, your imaging shows that you have a kidney stone.  I suspect this is likely the cause of your pain.  The size of your stone is associated with a greater percentage of being able to pass it on your own.  We normally help treat the pain as well as give you medication to help dilate your urinary system to allow you to pass it more easily.  I have sent prescriptions for both of these to your pharmacy.  If your pain is no better after few days of this treatment and you have not visualized the stone having passed, I recommend following up with a urologist.  I have attached the contact information for you to make a follow up appointment.   Continue to monitor how you are doing and return to the ER for any new or worsening symptoms such as inability to urinate, burning with urination, or if you develop a fever.

## 2023-12-03 NOTE — ED Notes (Signed)
Pt  physically DC by previous RN.  Taken out of the system by me.

## 2024-02-18 ENCOUNTER — Ambulatory Visit: Payer: 59 | Admitting: Neurology
